# Patient Record
Sex: Female | Born: 1960 | Race: Black or African American | Hispanic: No | Smoking: Former smoker
Health system: Southern US, Community
[De-identification: ages and names within clinical notes are randomized; demographics above are authoritative.]

## PROBLEM LIST (undated history)

## (undated) DIAGNOSIS — F32A Depression, unspecified: Secondary | ICD-10-CM

## (undated) DIAGNOSIS — E785 Hyperlipidemia, unspecified: Secondary | ICD-10-CM

## (undated) DIAGNOSIS — I10 Essential (primary) hypertension: Secondary | ICD-10-CM

## (undated) DIAGNOSIS — M1990 Unspecified osteoarthritis, unspecified site: Secondary | ICD-10-CM

## (undated) DIAGNOSIS — R001 Bradycardia, unspecified: Secondary | ICD-10-CM

## (undated) DIAGNOSIS — F329 Major depressive disorder, single episode, unspecified: Secondary | ICD-10-CM

## (undated) DIAGNOSIS — F419 Anxiety disorder, unspecified: Secondary | ICD-10-CM

## (undated) HISTORY — DX: Depression, unspecified: F32.A

## (undated) HISTORY — DX: Hyperlipidemia, unspecified: E78.5

## (undated) HISTORY — PX: OVARIAN CYST SURGERY: SHX726

## (undated) HISTORY — DX: Bradycardia, unspecified: R00.1

## (undated) HISTORY — PX: OVARY SURGERY: SHX727

## (undated) HISTORY — DX: Major depressive disorder, single episode, unspecified: F32.9

---

## 2000-12-19 ENCOUNTER — Encounter (HOSPITAL_COMMUNITY): Admission: RE | Admit: 2000-12-19 | Discharge: 2001-01-18 | Payer: Self-pay | Admitting: Family Medicine

## 2002-08-26 ENCOUNTER — Emergency Department (HOSPITAL_COMMUNITY): Admission: EM | Admit: 2002-08-26 | Discharge: 2002-08-26 | Payer: Self-pay | Admitting: Internal Medicine

## 2002-08-26 ENCOUNTER — Encounter: Payer: Self-pay | Admitting: Internal Medicine

## 2005-03-27 ENCOUNTER — Emergency Department (HOSPITAL_COMMUNITY): Admission: EM | Admit: 2005-03-27 | Discharge: 2005-03-27 | Payer: Self-pay | Admitting: Emergency Medicine

## 2007-02-10 ENCOUNTER — Ambulatory Visit: Payer: Self-pay | Admitting: Family Medicine

## 2007-02-10 ENCOUNTER — Ambulatory Visit (HOSPITAL_COMMUNITY): Admission: RE | Admit: 2007-02-10 | Discharge: 2007-02-10 | Payer: Self-pay | Admitting: Family Medicine

## 2007-03-04 ENCOUNTER — Encounter: Payer: Self-pay | Admitting: Family Medicine

## 2007-03-04 LAB — CONVERTED CEMR LAB
BUN: 8 mg/dL (ref 6–23)
Basophils Absolute: 0.1 10*3/uL (ref 0.0–0.1)
Basophils Relative: 1 % (ref 0–1)
CO2: 22 meq/L (ref 19–32)
Calcium: 9.6 mg/dL (ref 8.4–10.5)
Chloride: 106 meq/L (ref 96–112)
Cholesterol: 197 mg/dL (ref 0–200)
Creatinine, Ser: 0.85 mg/dL (ref 0.40–1.20)
Eosinophils Absolute: 0.2 10*3/uL (ref 0.0–0.7)
Eosinophils Relative: 2 % (ref 0–5)
Glucose, Bld: 75 mg/dL (ref 70–99)
HCT: 38.9 % (ref 36.0–46.0)
HDL: 59 mg/dL (ref >39)
Hemoglobin: 12.8 g/dL (ref 12.0–15.0)
LDL Cholesterol: 119 mg/dL — ABNORMAL HIGH (ref 0–99)
Lymphocytes Relative: 32 % (ref 12–46)
Lymphs Abs: 3.2 10*3/uL (ref 0.7–4.0)
MCHC: 32.9 g/dL (ref 30.0–36.0)
MCV: 85.3 fL (ref 78.0–100.0)
Monocytes Absolute: 0.5 10*3/uL (ref 0.1–1.0)
Monocytes Relative: 5 % (ref 3–12)
Neutro Abs: 6.2 10*3/uL (ref 1.7–7.7)
Neutrophils Relative %: 62 % (ref 43–77)
Platelets: 271 10*3/uL (ref 150–400)
Potassium: 4.4 meq/L (ref 3.5–5.3)
RBC: 4.56 M/uL (ref 3.87–5.11)
RDW: 14.4 % (ref 11.5–15.5)
Sodium: 139 meq/L (ref 135–145)
TSH: 1.158 microintl units/mL (ref 0.350–5.50)
Total CHOL/HDL Ratio: 3.3
Triglycerides: 96 mg/dL (ref <150)
VLDL: 19 mg/dL (ref 0–40)
WBC: 10 10*3/uL (ref 4.0–10.5)

## 2007-03-23 ENCOUNTER — Ambulatory Visit: Payer: Self-pay | Admitting: Family Medicine

## 2007-03-31 ENCOUNTER — Ambulatory Visit (HOSPITAL_COMMUNITY): Admission: RE | Admit: 2007-03-31 | Discharge: 2007-03-31 | Payer: Self-pay | Admitting: Family Medicine

## 2007-05-12 DIAGNOSIS — I10 Essential (primary) hypertension: Secondary | ICD-10-CM | POA: Insufficient documentation

## 2007-05-22 ENCOUNTER — Encounter: Payer: Self-pay | Admitting: Family Medicine

## 2007-05-22 ENCOUNTER — Ambulatory Visit: Payer: Self-pay | Admitting: Family Medicine

## 2007-05-22 ENCOUNTER — Other Ambulatory Visit: Admission: RE | Admit: 2007-05-22 | Discharge: 2007-05-22 | Payer: Self-pay | Admitting: Family Medicine

## 2007-05-24 ENCOUNTER — Encounter: Payer: Self-pay | Admitting: Family Medicine

## 2007-05-24 LAB — CONVERTED CEMR LAB
Candida species: NEGATIVE
Chlamydia, DNA Probe: NEGATIVE
GC Probe Amp, Genital: NEGATIVE
Gardnerella vaginalis: POSITIVE — AB
Trichomonal Vaginitis: NEGATIVE

## 2007-10-25 ENCOUNTER — Ambulatory Visit: Payer: Self-pay | Admitting: Family Medicine

## 2007-10-26 DIAGNOSIS — N938 Other specified abnormal uterine and vaginal bleeding: Secondary | ICD-10-CM | POA: Insufficient documentation

## 2007-10-26 DIAGNOSIS — F17200 Nicotine dependence, unspecified, uncomplicated: Secondary | ICD-10-CM | POA: Insufficient documentation

## 2007-11-01 ENCOUNTER — Ambulatory Visit (HOSPITAL_COMMUNITY): Admission: RE | Admit: 2007-11-01 | Discharge: 2007-11-01 | Payer: Self-pay | Admitting: Family Medicine

## 2007-11-23 ENCOUNTER — Telehealth: Payer: Self-pay | Admitting: Family Medicine

## 2008-01-29 ENCOUNTER — Ambulatory Visit: Payer: Self-pay | Admitting: Family Medicine

## 2008-01-29 DIAGNOSIS — E785 Hyperlipidemia, unspecified: Secondary | ICD-10-CM | POA: Insufficient documentation

## 2008-02-05 ENCOUNTER — Telehealth: Payer: Self-pay | Admitting: Family Medicine

## 2008-02-05 ENCOUNTER — Emergency Department (HOSPITAL_COMMUNITY): Admission: EM | Admit: 2008-02-05 | Discharge: 2008-02-05 | Payer: Self-pay | Admitting: Emergency Medicine

## 2008-02-06 ENCOUNTER — Ambulatory Visit: Payer: Self-pay | Admitting: Family Medicine

## 2008-02-06 DIAGNOSIS — E876 Hypokalemia: Secondary | ICD-10-CM | POA: Insufficient documentation

## 2008-02-07 ENCOUNTER — Encounter: Payer: Self-pay | Admitting: Family Medicine

## 2008-02-07 DIAGNOSIS — IMO0001 Reserved for inherently not codable concepts without codable children: Secondary | ICD-10-CM | POA: Insufficient documentation

## 2008-02-07 LAB — CONVERTED CEMR LAB: Total CK: 336 units/L — ABNORMAL HIGH (ref 7–177)

## 2008-02-09 ENCOUNTER — Encounter: Payer: Self-pay | Admitting: Family Medicine

## 2008-02-20 ENCOUNTER — Ambulatory Visit: Payer: Self-pay | Admitting: Family Medicine

## 2008-02-20 ENCOUNTER — Telehealth: Payer: Self-pay | Admitting: Family Medicine

## 2008-03-11 ENCOUNTER — Telehealth: Payer: Self-pay | Admitting: Family Medicine

## 2008-08-12 ENCOUNTER — Ambulatory Visit: Payer: Self-pay | Admitting: Family Medicine

## 2008-08-12 ENCOUNTER — Encounter: Payer: Self-pay | Admitting: Family Medicine

## 2008-08-12 ENCOUNTER — Other Ambulatory Visit: Admission: RE | Admit: 2008-08-12 | Discharge: 2008-08-12 | Payer: Self-pay | Admitting: Family Medicine

## 2008-08-12 LAB — CONVERTED CEMR LAB: OCCULT 1: NEGATIVE

## 2008-11-20 ENCOUNTER — Ambulatory Visit: Payer: Self-pay | Admitting: Family Medicine

## 2008-11-20 ENCOUNTER — Telehealth: Payer: Self-pay | Admitting: Family Medicine

## 2008-11-20 DIAGNOSIS — R5381 Other malaise: Secondary | ICD-10-CM | POA: Insufficient documentation

## 2008-11-20 DIAGNOSIS — Z862 Personal history of diseases of the blood and blood-forming organs and certain disorders involving the immune mechanism: Secondary | ICD-10-CM | POA: Insufficient documentation

## 2008-11-20 DIAGNOSIS — R5383 Other fatigue: Secondary | ICD-10-CM

## 2008-11-21 ENCOUNTER — Telehealth: Payer: Self-pay | Admitting: Family Medicine

## 2008-11-24 DIAGNOSIS — N920 Excessive and frequent menstruation with regular cycle: Secondary | ICD-10-CM | POA: Insufficient documentation

## 2008-11-26 ENCOUNTER — Encounter: Payer: Self-pay | Admitting: Family Medicine

## 2008-12-06 ENCOUNTER — Telehealth: Payer: Self-pay | Admitting: Family Medicine

## 2008-12-09 LAB — CONVERTED CEMR LAB: Retic Ct Pct: 5 % — ABNORMAL HIGH (ref 0.4–3.1)

## 2008-12-23 ENCOUNTER — Ambulatory Visit: Payer: Self-pay | Admitting: Family Medicine

## 2009-03-26 ENCOUNTER — Emergency Department (HOSPITAL_COMMUNITY): Admission: EM | Admit: 2009-03-26 | Discharge: 2009-03-26 | Payer: Self-pay | Admitting: Emergency Medicine

## 2009-06-09 ENCOUNTER — Ambulatory Visit: Payer: Self-pay | Admitting: Family Medicine

## 2009-06-09 ENCOUNTER — Emergency Department (HOSPITAL_COMMUNITY): Admission: EM | Admit: 2009-06-09 | Discharge: 2009-06-09 | Payer: Self-pay | Admitting: Emergency Medicine

## 2009-06-09 DIAGNOSIS — R002 Palpitations: Secondary | ICD-10-CM | POA: Insufficient documentation

## 2009-06-11 ENCOUNTER — Encounter: Payer: Self-pay | Admitting: Family Medicine

## 2009-06-17 ENCOUNTER — Ambulatory Visit: Payer: Self-pay | Admitting: Family Medicine

## 2009-08-13 ENCOUNTER — Telehealth: Payer: Self-pay | Admitting: Family Medicine

## 2009-10-30 ENCOUNTER — Ambulatory Visit: Payer: Self-pay | Admitting: Family Medicine

## 2010-03-15 ENCOUNTER — Encounter: Payer: Self-pay | Admitting: Family Medicine

## 2010-03-16 ENCOUNTER — Encounter: Payer: Self-pay | Admitting: Family Medicine

## 2010-03-19 ENCOUNTER — Ambulatory Visit
Admission: RE | Admit: 2010-03-19 | Discharge: 2010-03-19 | Payer: Self-pay | Source: Home / Self Care | Attending: Family Medicine | Admitting: Family Medicine

## 2010-03-20 ENCOUNTER — Other Ambulatory Visit: Payer: Self-pay | Admitting: Family Medicine

## 2010-03-20 ENCOUNTER — Other Ambulatory Visit (HOSPITAL_COMMUNITY)
Admission: RE | Admit: 2010-03-20 | Discharge: 2010-03-20 | Disposition: A | Discharge status: Home / Self Care | Payer: No Typology Code available for payment source | Source: Ambulatory Visit | Attending: Family Medicine | Admitting: Family Medicine

## 2010-03-20 DIAGNOSIS — Z01419 Encounter for gynecological examination (general) (routine) without abnormal findings: Secondary | ICD-10-CM | POA: Insufficient documentation

## 2010-03-20 LAB — CONVERTED CEMR LAB: OCCULT 1: NEGATIVE

## 2010-03-23 ENCOUNTER — Telehealth: Payer: Self-pay | Admitting: Family Medicine

## 2010-03-23 ENCOUNTER — Encounter: Payer: Self-pay | Admitting: Family Medicine

## 2010-03-23 ENCOUNTER — Other Ambulatory Visit: Payer: Self-pay | Admitting: Family Medicine

## 2010-03-23 DIAGNOSIS — Z139 Encounter for screening, unspecified: Secondary | ICD-10-CM

## 2010-03-24 ENCOUNTER — Encounter: Payer: Self-pay | Admitting: Family Medicine

## 2010-03-24 NOTE — Progress Notes (Signed)
Summary: med  Phone Note Call from Patient   Summary of Call: pt needs a refill on blood pressure meds and potissum meds. [facility] [phone] Initial call taken by: Rudene Anda,  August 13, 2009 8:28 AM    Prescriptions: BISOPROLOL-HYDROCHLOROTHIAZIDE 5-6.25 MG TABS (BISOPROLOL-HYDROCHLOROTHIAZIDE) Take 1 tablet by mouth every morning  #30 x 2   Entered by:   Adella Hare LPN   Authorized by:   Syliva Overman MD   Signed by:   Adella Hare LPN on 11/91/4782   Method used:   Electronically to        [facility] (retail)       [address]       [place], [place]  [address]       Ph: [phone]       Fax: [phone]   RxID:   [id number] KLOR-CON M20 20 MEQ CR-TABS (POTASSIUM CHLORIDE CRYS CR) Take 1 tablet by mouth two times a day  #60 x 2   Entered by:   Adella Hare LPN   Authorized by:   Syliva Overman MD   Signed by:   Adella Hare LPN on 47/42/5956   Method used:   Electronically to        [facility] [address] (retail)       [address]       [place], [place]  [address]       Ph: [phone]       Fax: [phone]   RxID:   [id number]   Appended Document: med Prescriptions: KLOR-CON M20 20 MEQ CR-TABS (POTASSIUM CHLORIDE CRYS CR) Take 1 tablet by mouth two times a day  #60 x 2   Entered by:   Adella Hare LPN   Authorized by:   Syliva Overman MD   Signed by:   Adella Hare LPN on 27/07/2374   Method used:   Electronically to        [facility]  [address] (retail)       [address]       [place], [place]  [address]       Ph: [phone]       Fax: [phone]   RxID:   [id number] BISOPROLOL-HYDROCHLOROTHIAZIDE 5-6.25 MG TABS (BISOPROLOL-HYDROCHLOROTHIAZIDE) Take 1 tablet by mouth every morning  #30 x 2   Entered by:   Adella Hare LPN   Authorized by:   Syliva Overman MD   Signed by:   Adella Hare LPN on 71/69/6789   Method used:   Electronically to        [facility]   [address] (retail)       [address]       [place], [place]  [address]       Ph: [phone]       Fax: [phone]   RxID:   [id number]  [facility], [facility]

## 2010-03-24 NOTE — Progress Notes (Signed)
Summary: MEDS  Phone Note Call from Patient   Summary of Call: NEEDS BC CHANGED TO SOMETHING ELSE SHE WENT TO PICK UP AND WAS 130.00 AND THE BP WAS 4.00  PLEASE SEND IN SOMETHING ELSE THANK YOU AND CALL HER LET HER KNOW Initial call taken by: Lind Guest,  March 11, 2008 2:36 PM  Follow-up for Phone Call        advise the only oCP's that are for $4 are SPRINTEC 28 day, or TRI-SPRINTEC 28 day tabpls erx one or the other, 1 cycle refill 3 whichever she prefers, I think it is worth trying Follow-up by: Syliva Overman MD,  March 11, 2008 4:46 PM  Additional Follow-up for Phone Call Additional follow up Details #1::        Phone Call Completed, Rx Called In Additional Follow-up by: Worthy Keeler LPN,  March 11, 2008 4:49 PM    New/Updated Medications: SPRINTEC 28 0.25-35 MG-MCG TABS (NORGESTIMATE-ETH ESTRADIOL) one tab by mouth qd   Prescriptions: SPRINTEC 28 0.25-35 MG-MCG TABS (NORGESTIMATE-ETH ESTRADIOL) one tab by mouth qd  #1 month x 3   Entered by:   Worthy Keeler LPN   Authorized by:   Syliva Overman MD   Signed by:   Worthy Keeler LPN on 15/17/6160   Method used:   Electronically to        [facility]  [address] (retail)       [address]       [place], [place]  [address]       Ph: [phone]       Fax: [phone]   RxID:   [id number]

## 2010-03-24 NOTE — Progress Notes (Signed)
Summary: NEEDS MEDICINE  Phone Note Call from Patient   Summary of Call: WANT TO KNOW ABOUT CALLING IN HER MEDICINE ABOUT HER HEAVY BLEEDING [facility] IN [place]  CALL BACK AT [phone] Initial call taken by: Lind Guest,  December 06, 2008 8:16 AM  Follow-up for Phone Call        Marijean Niemann already sent a message regarding this matter to doctor for review Follow-up by: Everitt Amber,  December 06, 2008 10:52 AM

## 2010-03-24 NOTE — Assessment & Plan Note (Signed)
Summary: office visit   Vital Signs:  Patient profile:   50 year old female Menstrual status:  regular Height:      64 inches Weight:      124.38 pounds BMI:     21.43 O2 Sat:      97 % Pulse rate:   128 / minute Pulse rhythm:   regular Resp:     16 per minute BP sitting:   130 / 80  (left arm) Cuff size:   regular  Vitals Entered By: Everitt Amber (November 20, 2008 1:07 PM) CC: severe menstrual bleeding, started period lastweek, and yesterday started bleeding very heavily and big clots. feeling very weak and fatigued. Hgb 9.4 Is Patient Diabetic? No   CC:  severe menstrual bleeding, started period lastweek, and and yesterday started bleeding very heavily and big clots. feeling very weak and fatigued. Hgb 9.4.  History of Present Illness: DFlooding clotting since yesterday at 1 pm , using tampons and pads and having to change every , passing large amts of bloods. shwee hS FIBROIDS  HAD A REGULAR CYCLE LAST WEEK , STARTED A NEW PACK ON sUNDAY AND DEVELOPED CONSTANT BLEEDING FOR THE past 24 hrs. she feels light headed, but denies syncope. This is the first episode of this nature in over 18 months and the most severe. she reports  that she was doing well, priior to this. Denies recent fever or chills. Denies sinus pressure, nasal congestion , ear pain or sore throat. Denies chest congestion, or cough productive of sputum. Denies chest pain, palpitations, PND, orthopnea or leg swelling. Denies abdominal pain, nausea, vomitting, diarrhea or constipation. Denies change in bowel movements or bloody stool. Denies dysuria , frequency, incontinence or hesitancy. Denies  joint pain, swelling, or reduced mobility. Denies headaches, vertigo, seizures. Reports anxiety with this new symptom. Denies  rash, lesions, or itch.       Preventive Screening-Counseling & Management  Alcohol-Tobacco     Smoking Cessation Counseling: yes  Allergies: 1)  ! Maxzide-25  (Triamterene-Hctz)  Review of Systems      See HPI  Physical Exam  General:  alert, well-nourished, and well-hydrated.  HEENT: No facial asymmetry,  EOMI, No sinus tenderness, TM's Clear, oropharynx  pink and moist.   Chest: Clear to auscultation bilaterally.  CVS: S1, S2, No murmurs, No S3.   Abd: Soft, Nontender.  MS: Adequate ROM spine, hips, shoulders and knees.  Ext: No edema.   CNS: CN 2-12 intact, power tone and sensation normal throughout.   Skin: Intact, no visible lesions or rashes.  Psych: Good eye contact, normal affect.  Memory intact,  anxious  appearing.     Impression & Recommendations:  Problem # 1:  FATIGUE (ICD-780.79) Assessment Deteriorated  Orders: T-CBC No Diff ([id number])  Problem # 2:  MENORRHAGIA (ICD-626.2) Assessment: Deteriorated  Her updated medication list for this problem includes:    Sprintec 28 0.25-35 Mg-mcg Tabs (Norgestimate-eth estradiol) ..... One tab by mouth qd    Medroxyprogesterone Acetate 10 Mg Tabs (Medroxyprogesterone acetate) .Marland KitchenMarland KitchenMarland KitchenMarland Kitchen 3 tablets every day, start 11/20/2008 Management was discussed with gynae on call Dr Despina Hidden, and close f/u next week was arranged prior to pt leaving the office. She was also advised that should her bleeding worsen or cease to lessen to go diresctly to the ED  Problem # 3:  HYPERTENSION (ICD-401.9) Assessment: Improved  Her updated medication list for this problem includes:    Hydrochlorothiazide 25 Mg Tabs (Hydrochlorothiazide) ..... One half tab by mouth qd  BP today: 130/80 Prior BP: 144/70 (08/12/2008)  Labs Reviewed: K+: 4.4 (03/04/2007) Creat: : 0.85 (03/04/2007)   Chol: 197 (03/04/2007)   HDL: 59 (03/04/2007)   LDL: 119 (03/04/2007)   TG: 96 (03/04/2007)  Problem # 4:  NICOTINE ADDICTION (ICD-305.1) Assessment: Unchanged  Encouraged smoking cessation and discussed different methods for smoking cessation.   Complete Medication List: 1)  Hydrochlorothiazide 25 Mg Tabs  (Hydrochlorothiazide) .... One half tab by mouth qd 2)  Sprintec 28 0.25-35 Mg-mcg Tabs (Norgestimate-eth estradiol) .... One tab by mouth qd 3)  Medroxyprogesterone Acetate 10 Mg Tabs (Medroxyprogesterone acetate) .... 3 tablets every day, start 11/20/2008  Other Orders: T- * Misc. Laboratory test [phone])  Patient Instructions: 1)  Please schedule a follow-up appointment in 1 month. 2)  You need to be evaluated urgently by gynae and I am contacting the on call doc, He has agreed to see you next week and we will get an appt made. 3)  Tobacco is very bad for your health and your loved ones! You Should stop smoking!. 4)  Stop Smoking Tips: Choose a Quit date. Cut down before the Quit date. decide what you will do as a substitute when you feel the urge to smoke(gum,toothpick,exercise). 5)  Med is sent to your pharmacy, start today, continue the OCp, DO nOT SMOKE. 6)  CBCand aemia panel on 10 04 /2010. 7)  Start OTC slow iron 1 twice daily. Prescriptions: MEDROXYPROGESTERONE ACETATE 10 MG TABS (MEDROXYPROGESTERONE ACETATE) 3 tablets every day, start 11/20/2008  #42 x 0   Entered by:   Everitt Amber   Authorized by:   Syliva Overman MD   Signed by:   Everitt Amber on 11/20/2008   Method used:   Electronically to        [facility]  [address] (retail)       [address]       [place], [place]  [address]       Ph: [phone]       Fax: [phone]   RxID:   [id number] MEDROXYPROGESTERONE ACETATE 10 MG TABS (MEDROXYPROGESTERONE ACETATE) 3 tablets every day, start 11/20/2008  #42 x 0   Entered and Authorized by:   Syliva Overman MD   Signed by:   Syliva Overman MD on 11/20/2008   Method used:   Electronically to        [facility] (retail)       [address]       [place], [place]  [address]       Ph: [phone]       Fax: [phone]   RxID:   [id number]

## 2010-03-24 NOTE — Progress Notes (Signed)
Summary: BP PILLS NAD BC   Phone Note Call from Patient   Summary of Call: WANTS TO KNOW IF YOU ARE GOING TO CHANGE HER BIRTH CONYROL PILL. AND THE PHAR. HAS NOT HEARD ANYTHING ABOUT HER BP PILLS THEY HAVE ALREADY FAXED THEM [phone] Initial call taken by: Lind Guest,  November 23, 2007 10:12 AM  Follow-up for Phone Call        Pt has refills done for bp meds.  Pls refer to Dr Hart [place] for fibroids and bleeding  pt wants appt NEXT YEAR pls Follow-up by: Syliva Overman MD,  November 24, 2007 10:48 AM  Additional Follow-up for Phone Call Additional follow up Details #1::        pt referred to dr,piggott. Faxed over information and they will call pt with appt.  Additional Follow-up by: Rudene Anda,  November 24, 2007 4:01 PM

## 2010-03-24 NOTE — Progress Notes (Signed)
Summary: rx  Phone Note Call from Patient   Summary of Call: needs high blood pressure pills and birth control faxed to [facility]. [phone] Initial call taken by: Rudene Anda,  March 11, 2008 9:23 AM  Follow-up for Phone Call        Rx Called In Follow-up by: Worthy Keeler LPN,  March 11, 2008 2:02 PM  Additional Follow-up for Phone Call Additional follow up Details #1::        noted, thank you Additional Follow-up by: Syliva Overman MD,  March 11, 2008 4:07 PM      Prescriptions: HYDROCHLOROTHIAZIDE 25 MG TABS (HYDROCHLOROTHIAZIDE) Take 1 tablet by mouth once a day  #30 x 2   Entered by:   Worthy Keeler LPN   Authorized by:   Syliva Overman MD   Signed by:   Worthy Keeler LPN on 45/40/9811   Method used:   Electronically to        [facility]  [address] (retail)       [address]       [place], [place]  [address]       Ph: [phone]       Fax: [phone]   RxID:   [id number] JOLESSA 0.15-0.03 MG TABS (LEVONORGEST-ETH ESTRAD 91-DAY) uad  #1 month x 2   Entered by:   Worthy Keeler LPN   Authorized by:   Syliva Overman MD   Signed by:   Worthy Keeler LPN on 66/44/0347   Method used:   Electronically to        [facility]  [address] (retail)       [address]       [place], [place]  [address]       Ph: [phone]       Fax: [phone]   RxID:   [id number]

## 2010-03-24 NOTE — Progress Notes (Signed)
Summary: rx  Phone Note Call from Patient   Summary of Call: pt needs rx [facility] to [facility] not [facility] Initial call taken by: Rudene Anda,  November 20, 2008 3:42 PM  Follow-up for Phone Call        sent to [facility] Follow-up by: Everitt Amber,  November 20, 2008 4:09 PM

## 2010-03-24 NOTE — Progress Notes (Signed)
Summary: refill on bc pills  Phone Note Call from Patient   Summary of Call: pt needs a refill on birth control pills. called into [facility]. [phone] Initial call taken by: Rudene Anda,  November 21, 2008 1:32 PM  Follow-up for Phone Call        Rx Called In Follow-up by: Worthy Keeler LPN,  November 21, 2008 1:45 PM

## 2010-03-24 NOTE — Miscellaneous (Signed)
Summary: lab  Clinical Lists Changes  Orders: Added new Test order of T-TSH [phone]) - Signed

## 2010-03-24 NOTE — Assessment & Plan Note (Signed)
Summary: BP CHECK  Nurse Visit   Vital Signs:  Patient Profile:   50 Years Old Female CC:      Nurse Visit Height:     64 inches (162.56 cm) O2 Sat:      98 % Pulse rate:   77 / minute Resp:     16 per minute BP sitting:   148 / 82  (left arm)             Comments Patient came in for BP check. It was slightly higher than the 18th visit at 148/82. Patient voiced no other complaints     Prior Medications: JOLESSA 0.15-0.03 MG TABS (LEVONORGEST-ETH ESTRAD 91-DAY) uad HYDROCODONE-ACETAMINOPHEN 5-325 MG TABS (HYDROCODONE-ACETAMINOPHEN) Take 1-2 tabs every 4-6 hours as needed HYDROCHLOROTHIAZIDE 25 MG TABS (HYDROCHLOROTHIAZIDE) Take 1 tablet by mouth once a day Current Allergies: ! MAXZIDE-25 (TRIAMTERENE-HCTZ)      ]

## 2010-03-24 NOTE — Assessment & Plan Note (Signed)
Summary: FLU SHOT  Nurse Visit   Allergies: 1)  ! Maxzide-25 (Triamterene-Hctz)  Orders Added: 1)  Influenza Vaccine NON MCR [00028]   Influenza Vaccine    Vaccine Type: Fluvax Non-MCR    Site: left deltoid    Mfr: Novartis    Dose: 0.5 ml    Route: IM    Given by: Everitt Amber    Exp. Date: 05/2009    Lot #: [id number]

## 2010-03-24 NOTE — Assessment & Plan Note (Signed)
Summary: office visit   Vital Signs:  Patient Profile:   50 Years Old Female Height:     63.5 inches Weight:      132.5 pounds BMI:     23.19 Pulse rate:   63 / minute Resp:     16 per minute BP sitting:   130 / 78  (right arm)  Pt. in pain?   no  Vitals Entered By: Calvert Cantor (October 25, 2007 4:09 PM)                  Chief Complaint:  c/o irregular menstrual bleed, spotting, and and f/u chronic probs.  History of Present Illness: Spotting every 2 days for 3 weeks per month.Pt. is frustrated, wonders if changing her pill will help. Currently using generic [place].  [employer] down to 5 per day, no quit date yet. Pt. denies any recent fever or chills.She denies depreeion, anxiety or insomnia.    Prior Medications Reviewed Using: Medication Bottles  Updated Prior Medication List: HYDROCHLOROTHIAZIDE 12.5 MG  TABS (HYDROCHLOROTHIAZIDE) one tab by mouth once daily QUASENSE 0.15-0.03 MG TABS (LEVONORGEST-ETH ESTRAD 91-DAY) take one tab by mouth once daily  Current Allergies: No known allergies     Risk Factors:     Counseled to quit/cut down tobacco use:  yes   Review of Systems  ENT      Denies earache, hoarseness, sinus pressure, and sore throat.  CV      Denies chest pain or discomfort, palpitations, shortness of breath with exertion, and swelling of feet.  Resp      Denies cough, shortness of breath, sputum productive, and wheezing.  GI      Denies abdominal pain, constipation, diarrhea, nausea, and vomiting.  GU      Denies dysuria and urinary frequency.  MS      Denies joint pain, joint swelling, muscle weakness, and stiffness.  Allergy      Denies hives or rash, itching eyes, and seasonal allergies.   Physical Exam  General:     Well-developed,well-nourished,in no acute distress; alert,appropriate and cooperative throughout examination Head:     Normocephalic and atraumatic without obvious abnormalities. No apparent alopecia or  balding. Eyes:     vision grossly intact.   Ears:     External ear exam shows no significant lesions or deformities.  Otoscopic examination reveals clear canals, tympanic membranes are intact bilaterally without bulging, retraction, inflammation or discharge. Hearing is grossly normal bilaterally. Nose:     no external deformity, no external erythema, and no nasal discharge.   Mouth:     Oral mucosa and oropharynx without lesions or exudates.  Teeth in good repair. Neck:     No deformities, masses, or tenderness noted. Lungs:     Normal respiratory effort, chest expands symmetrically. Lungs are clear to auscultation, no crackles or wheezes. Heart:     Normal rate and regular rhythm. S1 and S2 normal without gallop, murmur, click, rub or other extra sounds. Abdomen:     soft and non-tender.   Extremities:     No clubbing, cyanosis, edema, or deformity noted with normal full range of motion of all joints.   Skin:     Intact without suspicious lesions or rashes Psych:     Cognition and judgment appear intact. Alert and cooperative with normal attention span and concentration. No apparent delusions, illusions, hallucinations    Impression & Recommendations:  Problem # 1:  DYSFUNCTIONAL UTERINE BLEEDING (ICD-626.8) Assessment: Comment Only  Future Orders: Radiology Referral (Radiology) ... 10/31/2007 No med adjustment until report available, if endometrium is thick, then gynae eval.   Problem # 2:  NICOTINE ADDICTION (ICD-305.1) Assessment: Improved Encouraged smoking cessation and discussed different methods for smoking cessation.   Problem # 3:  HYPERTENSION (ICD-401.9) Assessment: Improved  Her updated medication list for this problem includes:    Hydrochlorothiazide 12.5 Mg Tabs (Hydrochlorothiazide) ..... One tab by mouth once daily   Complete Medication List: 1)  Hydrochlorothiazide 12.5 Mg Tabs (Hydrochlorothiazide) .... One tab by mouth once daily 2)  Quasense  0.15-0.03 Mg Tabs (Levonorgest-eth estrad 91-day) .... Take one tab by mouth once daily   Patient Instructions: 1)  Tobacco is very bad for your health and your loved ones! You Should stop smoking!. 2)  Stop Smoking Tips: Choose a Quit date. Cut down before the Quit date. decide what you will do as a substitute when you feel the urge to smoke(gum,toothpick,exercise). 3)  You need a pelvic US , we will schedule, pls call back in 2 days for appt. 4)  I will discuss with the pharmacist birth control options and I will get back to you. 5)  Please schedule a follow-up appointment in 6 months.   ]

## 2010-03-24 NOTE — Assessment & Plan Note (Signed)
Summary: office visit   Vital Signs:  Patient profile:   50 year old female Menstrual status:  regular Height:      64 inches Weight:      139.25 pounds BMI:     23.99 O2 Sat:      99 % on Room air Pulse rate:   136 / minute Pulse rhythm:   regular Resp:     16 per minute BP sitting:   160 / 100  (left arm)  Vitals Entered By: Adella Hare LPN (June 09, 2009 9:43 AM)  O2 Flow:  Room air CC: palpitations, short of breath, dizziness, malaise and fatigue, left arm tingle Is Patient Diabetic? No Pain Assessment Patient in pain? no        CC:  palpitations, short of breath, dizziness, malaise and fatigue, and left arm tingle.  History of Present Illness: Pt states that last Thursday she became aware that her blood pressure was ,high, she then doubled up on her medication. She reports palpitations, lightheadedness and feeling as though she will pass out since yesterday. She does not drink alcohol regularly, and she had a single drink 4 days ago. She still smokes the same # of cigarettes daily. She denies increased stress and anxietyprio to thedevelopment of these symptoms  Preventive Screening-Counseling & Management  Alcohol-Tobacco     Smoking Cessation Counseling: yes  Current Medications (verified): 1)  Hydrochlorothiazide 25 Mg Tabs (Hydrochlorothiazide) .... One Half Tab By Mouth Qd 2)  Megestrol Acetate 20 Mg Tabs (Megestrol Acetate) .... Two Tabs By Mouth Once Daily 3)  Slow Fe 160 (50 Fe) Mg Cr-Tabs (Ferrous Sulfate Dried) .... One Tab By Mouth Once Daily  Allergies (verified): 1)  ! Maxzide-25 (Triamterene-Hctz)  Review of Systems      See HPI General:  Complains of fatigue and weakness. Eyes:  Denies blurring and discharge. ENT:  Denies hoarseness, nasal congestion, sinus pressure, and sore throat. CV:  Complains of lightheadness, palpitations, and shortness of breath with exertion; denies chest pain or discomfort and swelling of feet. Resp:  Denies cough  and sputum productive. GI:  Denies abdominal pain, constipation, diarrhea, nausea, and vomiting. GU:  Complains of abnormal vaginal bleeding; denies dysuria and urinary frequency; corrected with med from gynae, likely megace. MS:  Denies joint pain and stiffness. Derm:  Denies itching, lesion(s), and rash. Neuro:  Denies headaches and seizures. Psych:  Denies anxiety and depression. Endo:  Complains of weight change; denies excessive thirst and excessive urination. Heme:  Denies abnormal bruising and bleeding. Allergy:  Denies hives or rash and sneezing.  Physical Exam  General:  Well-developed,well-nourished,in no acute distress; alert,appropriate and cooperative throughout examination. pt anxious and looks uncomfortable stating that she feels very lightheaded HEENT: No facial asymmetry,  EOMI, No sinus tenderness, TM's Clear, oropharynx  pink and moist.   Chest: Clear to auscultation bilaterally.  CVS: S1, S2, No murmurs, No S3.   Abd: Soft, Nontender.  MS: Adequate ROM spine, hips, shoulders and knees.  Ext: No edema.   CNS: CN 2-12 intact, power tone and sensation normal throughout.   Skin: Intact, no visible lesions or rashes.  Psych: Good eye contact, normal affect.  Memory intact,  anxious  appearing.    Impression & Recommendations:  Problem # 1:  PALPITATIONS (ICD-785.1) Assessment Comment Only  Orders: EKG w/ Interpretation (93000) sinus rate of 129, based on symtomatology , pt taken to the ED  Problem # 2:  MENORRHAGIA (ICD-626.2) Assessment: Improved  The following medications  were removed from the medication list:    Sprintec 28 0.25-35 Mg-mcg Tabs (Norgestimate-eth estradiol) ..... One tab by mouth qd  Problem # 3:  NICOTINE ADDICTION (ICD-305.1) Assessment: Unchanged  Encouraged smoking cessation and discussed different methods for smoking cessation.   Problem # 4:  HYPERTENSION (ICD-401.9) Assessment: Deteriorated  Her updated medication list for this  problem includes:    Hydrochlorothiazide 25 Mg Tabs (Hydrochlorothiazide) ..... One half tab by mouth qd  BP today: 160/100 Prior BP: 130/80 (11/20/2008)  Labs Reviewed: K+: 4.4 (03/04/2007) Creat: : 0.85 (03/04/2007)   Chol: 197 (03/04/2007)   HDL: 59 (03/04/2007)   LDL: 119 (03/04/2007)   TG: 96 (03/04/2007)  Complete Medication List: 1)  Hydrochlorothiazide 25 Mg Tabs (Hydrochlorothiazide) .... One half tab by mouth qd 2)  Megestrol Acetate 20 Mg Tabs (Megestrol acetate) .... Two tabs by mouth once daily 3)  Slow Fe 160 (50 Fe) Mg Cr-tabs (Ferrous sulfate dried) .... One tab by mouth once daily  Patient Instructions: 1)  f/U in 1 to 2 week  2)  Your heart is beating too  fast, and you are being taken to the ed 3)  Tobacco is very bad for your health and your loved ones! You Should stop smoking!. 4)  Stop Smoking Tips: Choose a Quit date. Cut down before the Quit date. decide what you will do as a substitute when you feel the urge to smoke(gum,toothpick,exercise).

## 2010-03-24 NOTE — Assessment & Plan Note (Signed)
Summary: HOS SAID TO MAKE APPT.   Vital Signs:  Patient Profile:   50 Years Old Female Height:     64 inches (162.56 cm) Weight:      131.31 pounds (59.69 kg) BMI:     22.62 BSA:     1.64 O2 Sat:      98 % Pulse rate:   61 / minute Resp:     16 per minute BP sitting:   126 / 80  (left arm)  Vitals Entered By: Everitt Amber (February 06, 2008 2:25 PM)                 Chief Complaint:  Follow up from ER visit. Still having some cramping in right leg.  History of Present Illness: Yesterday pt developed severe cramps and muscle spasms over her entire body. she dei start maxzide 1 week ago  and this is the only new med or food.She has not had any maxzide sine 2 days ago.  she was seen in the Ed, no spec diag made and she is prescrbide  codeine pain pill.She has been out of work since she got sick     Updated Prior Medication List: JOLESSA 0.15-0.03 MG TABS (LEVONORGEST-ETH ESTRAD 91-DAY) uad HYDROCODONE-ACETAMINOPHEN 5-325 MG TABS (HYDROCODONE-ACETAMINOPHEN) Take 1-2 tabs every 4-6 hours as needed  Current Allergies: ! MAXZIDE-25 (TRIAMTERENE-HCTZ)     Review of Systems  CV      Denies chest pain or discomfort, lightheadness, near fainting, and palpitations.  Resp      Denies cough and sputum productive.  GI      Denies abdominal pain, constipation, diarrhea, nausea, and vomiting.  GU      Denies dysuria and nocturia.  MS      See HPI      Complains of muscle aches, muscle, and cramps.  Neuro      Denies falling down, headaches, seizures, and sensation of room spinning.  Psych      Denies anxiety and depression.   Physical Exam  General:     alert, well-nourished, and well-hydrated.   Head:     Normocephalic and atraumatic without obvious abnormalities. No apparent alopecia or balding. Eyes:     vision grossly intact.   Ears:     R ear normal and L ear normal.   Nose:     no external deformity and no nasal discharge.   Mouth:     fair dentition.    Neck:     No deformities, masses, or tenderness noted. Lungs:     Normal respiratory effort, chest expands symmetrically. Lungs are clear to auscultation, no crackles or wheezes. Heart:     Normal rate and regular rhythm. S1 and S2 normal without gallop, murmur, click, rub or other extra sounds. Abdomen:     soft and non-tender.   Extremities:     No clubbing, cyanosis, edema, or deformity noted with normal full range of motion of all joints.   Neurologic:     alert & oriented X3, cranial nerves II-XII intact, strength normal in all extremities, and sensation intact to light touch.   Skin:     Intact without suspicious lesions or rashes Cervical Nodes:     No lymphadenopathy noted Psych:     Cognition and judgment appear intact. Alert and cooperative with normal attention span and concentration. No apparent delusions, illusions, hallucinations    Impression & Recommendations:  Problem # 1:  HYPOKALEMIA (ICD-276.8) Assessment: Comment Only tp to start otc  potassim daily and rept chem 7 in 2 weeks  Problem # 2:  NICOTINE ADDICTION (ICD-305.1) Assessment: Unchanged Encouraged smoking cessation and discussed different methods for smoking cessation.   Problem # 3:  HYPERLIPIDEMIA (ICD-272.4) Assessment: Comment Only  Orders: T-Basic Metabolic Panel [id number])  Labs Reviewed: Chol: 197 (03/04/2007)   HDL: 59 (03/04/2007)   LDL: 119 (03/04/2007)   TG: 96 (03/04/2007) low fat diet discussed and encouraged  Problem # 4:  HYPERTENSION (ICD-401.9) Assessment: Comment Only  The following medications were removed from the medication list:    Maxzide-25 37.5-25 Mg Tabs (Triamterene-hctz) .Marland Kitchen... Take 1 tablet by mouth once a day  Her updated medication list for this problem includes:    Hydrochlorothiazide 25 Mg Tabs (Hydrochlorothiazide) .Marland Kitchen... Take 1 tablet by mouth once a day, pt to start with half daily sinc she is normotensive off meds for 2 days.Maxzide is d/cd because of  advers drug reaction  BP today: 126/80 Prior BP: 150/90 (01/29/2008)  Labs Reviewed: Creat: 0.85 (03/04/2007) Chol: 197 (03/04/2007)   HDL: 59 (03/04/2007)   LDL: 119 (03/04/2007)   TG: 96 (03/04/2007)   Problem # 5:  MYOSITIS (ICD-729.1) Assessment: Comment Only  Her updated medication list for this problem includes:    Hydrocodone-acetaminophen 5-325 Mg Tabs (Hydrocodone-acetaminophen) .Marland Kitchen... Take 1-2 tabs every 4-6 hours as needed  Orders: T- * Misc. Laboratory test [phone]) likely due to advers drug rxn to maxzide, will check cPK  Complete Medication List: 1)  Jolessa 0.15-0.03 Mg Tabs (Levonorgest-eth estrad 91-day) .... Uad 2)  Hydrocodone-acetaminophen 5-325 Mg Tabs (Hydrocodone-acetaminophen) .... Take 1-2 tabs every 4-6 hours as needed 3)  Hydrochlorothiazide 25 Mg Tabs (Hydrochlorothiazide) .... Take 1 tablet by mouth once a day   Patient Instructions: 1)  Nurse visit in 2 weeks for BP check.Marland Kitchen 2)  CPE iN March 30 or after. 3)  OLS check your BP daily and start HCTZ 25mg  HALF DAILY once your BP is 140/90 or more...(the bottle says onedaily) 4)  I anticipate you will need to resume the med by the weekend. PLS go to Urgent care or ED if you have any concerns. 5)  PLS start otc potassium 99mg  tab 1 daily. 6)  BMP prior to visit, ICD-9:  in 2 weeks   Prescriptions: HYDROCHLOROTHIAZIDE 25 MG TABS (HYDROCHLOROTHIAZIDE) Take 1 tablet by mouth once a day  #30 x 2   Entered and Authorized by:   Syliva Overman MD   Signed by:   Syliva Overman MD on 02/06/2008   Method used:   Electronically to        [facility] (retail)       [address]       [place], [place]  [address]       Ph: [phone]       Fax: [phone]   RxID:   [id number]  ]

## 2010-03-24 NOTE — Assessment & Plan Note (Signed)
Summary: physical   Vital Signs:  Patient profile:   50 year old female Menstrual status:  regular Height:      64 inches Weight:      126 pounds BMI:     21.71 Pulse rate:   87 / minute Pulse rhythm:   regular Resp:     16 per minute BP sitting:   144 / 70  (left arm)  Vitals Entered By: Worthy Keeler LPN (August 12, 2008 2:41 PM) CC: physical Is Patient Diabetic? No Pain Assessment Patient in pain? no       Vision Screening:Left eye w/o correction: 20 / 30 Right Eye w/o correction: 20 / 25 Both eyes w/o correction:  20/ 20        Vision Entered By: Worthy Keeler LPN (August 12, 2008 4:17 PM)  years   days  Menstrual Status regular   CC:  physical.  History of Present Illness: Smoking approx 5 ciggs per day, no quit date set aT THIS TIME. tHE PT HAS BEEN DOING FAIRLY WELL. She denies any recent fever or chills, head or chest congestion, dysuria or frequency. Shedenies significant jopint pain or tenderness. She continues to use contraceptive pills to ssist with control of her cycle, primarily.  Preventive Screening-Counseling & Management  Alcohol-Tobacco     Smoking Cessation Counseling: yes  Current Medications (verified): 1)  Hydrochlorothiazide 25 Mg Tabs (Hydrochlorothiazide) .... One Half Tab By Mouth Qd 2)  Sprintec 28 0.25-35 Mg-Mcg Tabs (Norgestimate-Eth Estradiol) .... One Tab By Mouth Qd  Allergies (verified): 1)  ! Maxzide-25 (Triamterene-Hctz)  Review of Systems      See HPI General:  Denies chills and fever. ENT:  Denies earache, hoarseness, nasal congestion, sinus pressure, and sore throat. CV:  Denies chest pain or discomfort, difficulty breathing while lying down, palpitations, and swelling of feet. Resp:  Denies coughing up blood and sputum productive. GI:  Denies abdominal pain, change in bowel habits, constipation, diarrhea, nausea, and vomiting. GU:  Complains of abnormal vaginal bleeding; denies dysuria and urinary frequency; pt has had  excessive vag bleeding all of her life and continues to depend on contraceptives to control this.. MS:  Denies joint pain and joint swelling. Derm:  Complains of lesion(s); sole of right foot, p[lantar wart . Psych:  Denies anxiety and depression. Heme:  Denies bleeding, enlarge lymph nodes, and fevers.  Physical Exam  General:  alert, well-nourished, and well-hydrated.   Head:  normocephalic.   Eyes:  No corneal or conjunctival inflammation noted. EOMI. Perrla. Funduscopic exam benign, without hemorrhages, exudates or papilledema. Vision grossly normal. Ears:  External ear exam shows no significant lesions or deformities.  Otoscopic examination reveals clear canals, tympanic membranes are intact bilaterally without bulging, retraction, inflammation or discharge. Hearing is grossly normal bilaterally. Nose:  External nasal examination shows no deformity or inflammation. Nasal mucosa are pink and moist without lesions or exudates. Mouth:  pharynx pink and moist and fair dentition.   Neck:  No deformities, masses, or tenderness noted. Chest Wall:  No deformities, masses, or tenderness noted. Breasts:  No mass, nodules, thickening, tenderness, bulging, retraction, inflamation, nipple discharge or skin changes noted.   Lungs:  Normal respiratory effort, chest expands symmetrically. Lungs are clear to auscultation, no crackles or wheezes. Heart:  Normal rate and regular rhythm. S1 and S2 normal without gallop, murmur, click, rub or other extra sounds. Abdomen:  Bowel sounds positive,abdomen soft and non-tender without masses, organomegaly or hernias noted. Rectal:  No external abnormalities noted.  Normal sphincter tone. No rectal masses or tenderness.Guaic neg stool Genitalia:  Normal introitus for age, no external lesions, no vaginal discharge, mucosa pink and moist, no vaginal or cervical lesions, no vaginal atrophy, no friaility or hemorrhage,enlargedl uterus size and position, no adnexal masses  or tenderness Msk:  No deformity or scoliosis noted of thoracic or lumbar spine.   Pulses:  R and L carotid,radial,femoral,dorsalis pedis and posterior tibial pulses are full and equal bilaterally Extremities:  No clubbing, cyanosis, edema, or deformity noted with normal full range of motion of all joints.   Neurologic:  No cranial nerve deficits noted. Station and gait are normal. Plantar reflexes are down-going bilaterally. DTRs are symmetrical throughout. Sensory, motor and coordinative functions appear intact. Skin:  Intact without suspicious lesions or rashes Cervical Nodes:  No lymphadenopathy noted Axillary Nodes:  No palpable lymphadenopathy Inguinal Nodes:  No significant adenopathy Psych:  Cognition and judgment appear intact. Alert and cooperative with normal attention span and concentration. No apparent delusions, illusions, hallucinations   Impression & Recommendations:  Problem # 1:  SPECIAL SCREENING FOR MALIGNANT NEOPLASMS COLON (ICD-V76.51) Assessment Comment Only  guaic neg stool, and gI referral based on pt's agees: Gastroenterology Referral (GI)  Problem # 2:  HYPERLIPIDEMIA (ICD-272.4) Assessment: Comment Only  Orders: T-Lipid Profile ([id number])    HDL:59 (03/04/2007)  LDL:119 (03/04/2007)  Chol:197 (03/04/2007)  Trig:96 (03/04/2007)  Problem # 3:  HYPERTENSION (ICD-401.9) Assessment: Unchanged  Her updated medication list for this problem includes:    Hydrochlorothiazide 25 Mg Tabs (Hydrochlorothiazide) ..... One half tab by mouth qd  Orders: T-Basic Metabolic Panel ([id number])  BP today: 144/70 Prior BP: 148/82 (02/20/2008)  Labs Reviewed: K+: 4.4 (03/04/2007) Creat: : 0.85 (03/04/2007)   Chol: 197 (03/04/2007)   HDL: 59 (03/04/2007)   LDL: 119 (03/04/2007)   TG: 96 (03/04/2007)  Problem # 4:  NICOTINE ADDICTION (ICD-305.1) Assessment: Improved  Encouraged smoking cessation and discussed different methods for smoking cessation.   Problem #  5:  DYSFUNCTIONAL UTERINE BLEEDING (ICD-626.8)  Complete Medication List: 1)  Hydrochlorothiazide 25 Mg Tabs (Hydrochlorothiazide) .... One half tab by mouth qd 2)  Sprintec 28 0.25-35 Mg-mcg Tabs (Norgestimate-eth estradiol) .... One tab by mouth qd  Other Orders: T-CBC w/Diff ([id number]) Hemoccult Guaiac-1 spec.(in office) (82270) Pap Smear (66063) Future Orders: Radiology Referral (Radiology) ... 08/13/2008  Patient Instructions: 1)  Please schedule a follow-up appointment in 4 months. 2)  Tobacco is very bad for your health and your loved ones! You Should stop smoking!. 3)  Stop Smoking Tips: Choose a Quit date. Cut down before the Quit date. decide what you will do as a substitute when you feel the urge to smoke(gum,toothpick,exercise). 4)  It is important that you exercise regularly at least 20 minutes 5 times a week. If you develop chest pain, have severe difficulty breathing, or feel very tired , stop exercising immediately and seek medical attention. 5)  BMP prior to visit, ICD-9: 6)  Lipid Panel prior to visit, ICD-9:  fastin asap 7)  Your BP is high today, pls watch salt and ealcohol use 8)  CBC w/ Diff prior to visit, ICD-9: Prescriptions: SPRINTEC 28 0.25-35 MG-MCG TABS (NORGESTIMATE-ETH ESTRADIOL) one tab by mouth qd  #28 Each x 11   Entered by:   Worthy Keeler LPN   Authorized by:   Syliva Overman MD   Signed by:   Worthy Keeler LPN on 01/60/1093   Method used:   Electronically to        [facility] (retail)       [address]       [place], [place]  [address]       Ph: [phone]       Fax: [phone]   RxID:   [id number] HYDROCHLOROTHIAZIDE 25 MG TABS (HYDROCHLOROTHIAZIDE) one half tab by mouth qd  #30 x 3   Entered by:   Worthy Keeler LPN   Authorized by:   Syliva Overman MD   Signed by:   Worthy Keeler LPN on 40/11/2723   Method used:   Electronically to        [facility] (retail)       [address]       [place], [place]  [address]       Ph: [phone]       Fax: [phone]   RxID:   [id number]    Laboratory Results  Date/Time Received: 08/12/08 4:20pm Date/Time Reported: 08/12/08 4:20pm  Stool - Occult Blood Hemmoccult #1: negative Date: 08/12/2008 Comments: [id number] 11L 8/11 118 10/12

## 2010-03-24 NOTE — Progress Notes (Signed)
Summary: cramps  Phone Note Call from Patient   Summary of Call: having cramps in back and legs. wondering what she should do [phone] Initial call taken by: Rudene Anda,  February 05, 2008 8:26 AM  Follow-up for Phone Call        patients sister called states patient has cramps all over and can see the muscles tensed up. advised to go to er  Follow-up by: Worthy Keeler LPN,  February 05, 2008 9:21 AM

## 2010-03-24 NOTE — Assessment & Plan Note (Signed)
Summary: FUP   Vital Signs:  Patient profile:   50 year old female Menstrual status:  regular Height:      64 inches Weight:      142.50 pounds BMI:     24.55 O2 Sat:      98 % Pulse rate:   62 / minute Pulse rhythm:   regular Resp:     16 per minute BP sitting:   130 / 80  (left arm) Cuff size:   regular  Vitals Entered By: Everitt Amber LPN (June 17, 2009 9:42 AM) CC: ER follow up from the 17th. Thought she was having heart attack. Heart was pounding and beating fast. No pain. BP was high   CC:  ER follow up from the 17th. Thought she was having heart attack. Heart was pounding and beating fast. No pain. BP was high.  History of Present Illness: Prt in for eR follow up. She was seen last week with new onset palpitations and uncontrolled hTN. Shje is on new meds which she is tolerating well, and she is no lonfer experiencing palpitations. she denies fever or chills. She still smokes stating she uses the nicotine to "calm her nerves" She denies head or chst congestion, dysuria or frequency.She denies depression.  Preventive Screening-Counseling & Management  Alcohol-Tobacco     Smoking Cessation Counseling: yes  Current Medications (verified): 1)  Megestrol Acetate 20 Mg Tabs (Megestrol Acetate) .... Two Tabs By Mouth Once Daily 2)  Slow Fe 160 (50 Fe) Mg Cr-Tabs (Ferrous Sulfate Dried) .... One Tab By Mouth Once Daily 3)  Klor-Con M20 20 Meq Cr-Tabs (Potassium Chloride Crys Cr) .... Take 1 Tablet By Mouth Two Times A Day 4)  Bisoprolol-Hydrochlorothiazide 5-6.25 Mg Tabs (Bisoprolol-Hydrochlorothiazide) .... Take 1 Tablet By Mouth Every Morning  Allergies (verified): 1)  ! Maxzide-25 (Triamterene-Hctz)  Review of Systems General:  Complains of fatigue. CV:  Denies chest pain or discomfort, lightheadness, near fainting, palpitations, shortness of breath with exertion, and swelling of feet. Resp:  Denies cough and sputum productive. GI:  Denies abdominal pain, constipation,  diarrhea, nausea, and vomiting. MS:  Denies joint pain, low back pain, mid back pain, and stiffness. Endo:  Denies cold intolerance, excessive hunger, excessive thirst, excessive urination, heat intolerance, polyuria, and weight change. Heme:  Denies abnormal bruising and bleeding. Allergy:  Denies hives or rash and itching eyes.  Physical Exam  General:  Well-developed,well-nourished,in no acute distress; alert,appropriate and cooperative throughout examination HEENT: No facial asymmetry,  EOMI, No sinus tenderness, TM's Clear, oropharynx  pink and moist.   Chest: Clear to auscultation bilaterally.  CVS: S1, S2, No murmurs, No S3.   Abd: Soft, Nontender.  MS: Adequate ROM spine, hips, shoulders and knees.  Ext: No edema.   CNS: CN 2-12 intact, power tone and sensation normal throughout.   Skin: Intact, no visible lesions or rashes.  Psych: Good eye contact, normal affect.  Memory intact, not anxious or depressed appearing.    Impression & Recommendations:  Problem # 1:  PALPITATIONS (ICD-785.1) Assessment Improved  Her updated medication list for this problem includes:    Bisoprolol-hydrochlorothiazide 5-6.25 Mg Tabs (Bisoprolol-hydrochlorothiazide) .Marland Kitchen... Take 1 tablet by mouth every morning  Problem # 2:  NICOTINE ADDICTION (ICD-305.1) Assessment: Unchanged  Encouraged smoking cessation and discussed different methods for smoking cessation.   Problem # 3:  HYPERLIPIDEMIA (ICD-272.4) Assessment: Comment Only    HDL:59 (03/04/2007)  LDL:119 (03/04/2007)  Chol:197 (03/04/2007)  Trig:96 (03/04/2007)low fat diet discussed and encouraged  Problem #  4:  HYPERTENSION (ICD-401.9) Assessment: Improved  The following medications were removed from the medication list:    Hydrochlorothiazide 25 Mg Tabs (Hydrochlorothiazide) ..... One half tab by mouth qd Her updated medication list for this problem includes:    Bisoprolol-hydrochlorothiazide 5-6.25 Mg Tabs  (Bisoprolol-hydrochlorothiazide) .Marland Kitchen... Take 1 tablet by mouth every morning  Orders: T-Basic Metabolic Panel [id number])  BP today: 130/80 Prior BP: 160/100 (06/09/2009)  Labs Reviewed: K+: 4.4 (03/04/2007) Creat: : 0.85 (03/04/2007)   Chol: 197 (03/04/2007)   HDL: 59 (03/04/2007)   LDL: 119 (03/04/2007)   TG: 96 (03/04/2007)  Complete Medication List: 1)  Megestrol Acetate 20 Mg Tabs (Megestrol acetate) .... Two tabs by mouth once daily 2)  Slow Fe 160 (50 Fe) Mg Cr-tabs (Ferrous sulfate dried) .... One tab by mouth once daily 3)  Klor-con M20 20 Meq Cr-tabs (Potassium chloride crys cr) .... Take 1 tablet by mouth two times a day 4)  Bisoprolol-hydrochlorothiazide 5-6.25 Mg Tabs (Bisoprolol-hydrochlorothiazide) .... Take 1 tablet by mouth every morning  Other Orders: Radiology Referral (Radiology)  Patient Instructions: 1)  Please schedule a follow-up appointment in 4.5 months. 2)  Tobacco is very bad for your health and your loved ones! You Should stop smoking!. 3)  Stop Smoking Tips: Choose a Quit date. Cut down before the Quit date. decide what you will do as a substitute when you feel the urge to smoke(gum,toothpick,exercise). 4)  Your blood prerssure is great 5)  BMP prior to visit, ICD-9:  dx htn and low potassium 6)  we will sched a mamo ,past due. 7)    8)  Schedule a colonoscopy/sigmoidoscopy to help detect colon cancer. 9)  You need to have a Pap Smear to prevent cervical cancer.

## 2010-03-24 NOTE — Progress Notes (Signed)
Summary: bleeding  Phone Note Call from Patient   Summary of Call: pt been passing clots since yesterday. she has to change every ten mins. can't come in but have in idea of what she could do. [phone] Initial call taken by: Rudene Anda,  November 20, 2008 9:21 AM  Follow-up for Phone Call        pt was seen in office today Follow-up by: Syliva Overman MD,  November 20, 2008 4:51 PM

## 2010-03-24 NOTE — Progress Notes (Signed)
Summary: BIRTH CONTROL  Phone Note Call from Patient   Summary of Call: NEEDS HER REGULAR BIRTH CONTROL PILL  SENT TO [facility] IN [place] Initial call taken by: Lind Guest,  November 21, 2008 11:01 AM    Prescriptions: SPRINTEC 28 0.25-35 MG-MCG TABS (NORGESTIMATE-ETH ESTRADIOL) one tab by mouth qd  #28 Each x 2   Entered by:   Worthy Keeler LPN   Authorized by:   Syliva Overman MD   Signed by:   Worthy Keeler LPN on 09/81/1914   Method used:   Electronically to        [facility]  [address] (retail)       [address]       [place], [place]  [address]       Ph: [phone]       Fax: [phone]   RxID:   [id number]

## 2010-03-24 NOTE — Assessment & Plan Note (Signed)
Summary: NURSE VISIT  Nurse Visit   Vital Signs:  Patient Profile:   50 Years Old Female CC:      NURSE VISIT Height:     64 inches (162.56 cm) O2 Sat:      86 % Resp:     16 per minute BP sitting:   140 / 84  (left arm)             Comments Maressa CAME IN FOR A BP CHECK AND IT WAS 140/84     Prior Medications: JOLESSA 0.15-0.03 MG TABS (LEVONORGEST-ETH ESTRAD 91-DAY) uad HYDROCODONE-ACETAMINOPHEN 5-325 MG TABS (HYDROCODONE-ACETAMINOPHEN) Take 1-2 tabs every 4-6 hours as needed HYDROCHLOROTHIAZIDE 25 MG TABS (HYDROCHLOROTHIAZIDE) Take 1 tablet by mouth once a day Current Allergies: ! MAXZIDE-25 (TRIAMTERENE-HCTZ)      ]

## 2010-03-24 NOTE — Progress Notes (Signed)
  Phone Note Call from Patient   Summary of Call: Catherine Mclean came in for BP check and it was 148/82 in right arm and 140/82 in left arm. Told her I would call her if you recommended any changes in her medication. Initial call taken by: Everitt Amber,  February 20, 2008 3:41 PM  Follow-up for Phone Call        no med changes now advise her to sched md visit in 3 months, cut down on nicotine this will help her bp Follow-up by: Syliva Overman MD,  February 20, 2008 3:50 PM  Additional Follow-up for Phone Call Additional follow up Details #1::        patient aware Additional Follow-up by: Everitt Amber,  February 20, 2008 4:25 PM

## 2010-03-24 NOTE — Progress Notes (Signed)
Summary: WANTS TELL U DIA. FROM ER  Phone Note Call from Patient   Summary of Call: [facility] CALLED AND IS DOING BETTER AND WENT TO ER AND WANTED TO GIVE U THE DIAG CALL BACK [phone] Initial call taken by: Lind Guest,  February 05, 2008 3:26 PM  Follow-up for Phone Call        attempted twice to call back but unable, pls download the d/c summary Follow-up by: Syliva Overman MD,  February 05, 2008 4:27 PM  Additional Follow-up for Phone Call Additional follow up Details #1::        Downloaded physican report from echart and gave to Dr. Lodema Hong Additional Follow-up by: Everitt Amber,  February 06, 2008 1:19 PM

## 2010-03-24 NOTE — Assessment & Plan Note (Signed)
Summary: flu shot  Nurse Visit   Vitals Entered By: Everitt Amber LPN (October 30, 2009 4:46 PM) Comments Patient in to recieve flu vaccine     Allergies: 1)  ! Maxzide-25 (Triamterene-Hctz)  Orders Added: 1)  Influenza Vaccine NON MCR [[id number]]   Influenza Vaccine    Vaccine Type: Fluvax Non-MCR    Site: left deltoid    Mfr: novartis    Dose: 0.5 ml    Route: IM    Given by: Everitt Amber LPN    Exp. Date: 06/2010    Lot #: [id number]  Flu Vaccine Consent Questions    Do you have a history of severe allergic reactions to this vaccine? no    Any prior history of allergic reactions to egg and/or gelatin? no    Do you have a sensitivity to the preservative Thimersol? no    Do you have a past history of Guillan-Barre Syndrome? no    Do you currently have an acute febrile illness? no    Have you ever had a severe reaction to latex? no    Vaccine information given and explained to patient? no    Are you currently pregnant? no

## 2010-03-24 NOTE — Assessment & Plan Note (Signed)
Summary: FOLLOW UP   Vital Signs:  Patient Profile:   50 Years Old Female Height:     64 inches Weight:      129.44 pounds BMI:     22.30 Pulse rate:   16 / minute Resp:     16 per minute BP sitting:   150 / 90  (left arm)  Pt. in pain?   no  Vitals Entered By: Worthy Keeler LPN (January 29, 2008 4:14 PM)                  Chief Complaint:  f/u htn and excess menses.  History of Present Illness: Smoking 5 ciggs /day.  No meore bleding on OCP. Pt. denies any recent fever or chills, depression or anxiety.      Updated Prior Medication List: HYDROCHLOROTHIAZIDE 12.5 MG  TABS (HYDROCHLOROTHIAZIDE) one tab by mouth once daily JOLESSA 0.15-0.03 MG TABS (LEVONORGEST-ETH ESTRAD 91-DAY) uad  Current Allergies: No known allergies     Risk Factors:     Counseled to quit/cut down tobacco use:  yes   Review of Systems  General      Denies chills, fatigue, fever, loss of appetite, malaise, sleep disorder, sweats, weakness, and weight loss.  ENT      Denies earache, hoarseness, nasal congestion, sinus pressure, and sore throat.  CV      Denies chest pain or discomfort, palpitations, and shortness of breath with exertion.  Resp      Denies cough, sputum productive, and wheezing.  GI      Denies abdominal pain, constipation, diarrhea, nausea, and vomiting.  GU      Denies dysuria and urinary frequency.  MS      Denies joint pain and stiffness.  Psych      Denies anxiety and depression.   Physical Exam  General:     Well-developed,well-nourished,in no acute distress; alert,appropriate and cooperative throughout examination Head:     Normocephalic and atraumatic without obvious abnormalities. No apparent alopecia or balding. Ears:     R ear normal and L ear normal.   Nose:     no external deformity and no nasal discharge.   Mouth:     fair dentition.   Neck:     No deformities, masses, or tenderness noted. Lungs:     Normal respiratory effort,  chest expands symmetrically. Lungs are clear to auscultation, no crackles or wheezes. Heart:     Normal rate and regular rhythm. S1 and S2 normal without gallop, murmur, click, rub or other extra sounds. Abdomen:     soft and non-tender.   Extremities:     No clubbing, cyanosis, edema, or deformity noted with normal full range of motion of all joints.   Skin:     Intact without suspicious lesions or rashes Psych:     Cognition and judgment appear intact. Alert and cooperative with normal attention span and concentration. No apparent delusions, illusions, hallucinations    Impression & Recommendations:  Problem # 1:  HYPERLIPIDEMIA (ICD-272.4) Assessment: Comment Only  Orders: T-Lipid Profile ([id number])  Labs Reviewed: Chol: 197 (03/04/2007)   HDL: 59 (03/04/2007)   LDL: 119 (03/04/2007)   TG: 96 (03/04/2007)   Problem # 2:  NICOTINE ADDICTION (ICD-305.1) Assessment: Improved Encouraged smoking cessation and discussed different methods for smoking cessation.   Problem # 3:  DYSFUNCTIONAL UTERINE BLEEDING (ICD-626.8) Assessment: Improved  Orders: T-CBC w/Diff ([id number])   Problem # 4:  HYPERTENSION (ICD-401.9) Assessment: Deteriorated  The following medications  were removed from the medication list:    Hydrochlorothiazide 12.5 Mg Tabs (Hydrochlorothiazide) ..... One tab by mouth once daily  Her updated medication list for this problem includes:    Maxzide-25 37.5-25 Mg Tabs (Triamterene-hctz) .Marland Kitchen... Take 1 tablet by mouth once a day  Orders: T-Basic Metabolic Panel [id number])  BP today: 150/90 Prior BP: 130/78 (10/25/2007)  Labs Reviewed: Creat: 0.85 (03/04/2007) Chol: 197 (03/04/2007)   HDL: 59 (03/04/2007)   LDL: 119 (03/04/2007)   TG: 96 (03/04/2007)   Complete Medication List: 1)  Jolessa 0.15-0.03 Mg Tabs (Levonorgest-eth estrad 91-day) .... Uad 2)  Maxzide-25 37.5-25 Mg Tabs (Triamterene-hctz) .... Take 1 tablet by mouth once a day   Patient  Instructions: 1)  Cpe last week of March or early april. 2)  Tobacco is very bad for your health and your loved ones! You Should stop smoking!. 3)  Stop Smoking Tips: Choose a Quit date. Cut down before the Quit date. decide what you will do as a substitute when you feel the urge to smoke(gum,toothpick,exercise). 4)  It is important that you exercise regularly at least 20 minutes 5 times a week. If you develop chest pain, have severe difficulty breathing, or feel very tired , stop exercising immediately and seek medical attention. 5)  Check your Blood Pressure regularly. If it is above150/95  you should make an appointment. 6)  YOUR bp is still high, you need to STOP the current BP med and START a NEW MED maxzide 25mg  1 daily , it is at your pharmacy. 7)  BMP prior to visit, ICD-9: 8)  Lipid Panel prior to visit, ICD-9: 9)  CBC w/ Diff prior to visit, ICD-9:   fasting labs end March    Prescriptions: MAXZIDE-25 37.5-25 MG TABS (TRIAMTERENE-HCTZ) Take 1 tablet by mouth once a day  #30 x 4   Entered and Authorized by:   Syliva Overman MD   Signed by:   Syliva Overman MD on 01/29/2008   Method used:   Electronically to        [facility] (retail)       [address]       [place], [place]  [address]       Ph: [phone]       Fax: [phone]   RxID:   [id number]  ]

## 2010-03-30 ENCOUNTER — Ambulatory Visit (HOSPITAL_COMMUNITY): Payer: No Typology Code available for payment source

## 2010-04-01 NOTE — Letter (Signed)
Summary: DR.EURE  DR.EURE   Imported By: Lind Guest 03/23/2010 10:50:16  _____________________________________________________________________  External Attachment:    Type:   Image     Comment:   External Document

## 2010-04-01 NOTE — Letter (Signed)
Summary: dr.eure  dr.eure   Imported By: Lind Guest 03/24/2010 13:09:59  _____________________________________________________________________  External Attachment:    Type:   Image     Comment:   External Document

## 2010-04-01 NOTE — Progress Notes (Signed)
  Phone Note Other Incoming   Caller: dr simpson Summary of Call: pls let pt know I got a response from Dr Despina Hidden, he states she can be continued on the med, pls refill 1 year total and let her know, the megace Initial call taken by: Syliva Overman MD,  March 23, 2010 6:19 PM  Follow-up for Phone Call        med sent, called patient, left message Follow-up by: Adella Hare LPN,  March 24, 2010 12:13 PM  Additional Follow-up for Phone Call Additional follow up Details #1::        patient aware Additional Follow-up by: Adella Hare LPN,  March 24, 2010 2:15 PM    Prescriptions: MEGESTROL ACETATE 20 MG TABS (MEGESTROL ACETATE) two tabs by mouth once daily  #60 x 10   Entered by:   Adella Hare LPN   Authorized by:   Syliva Overman MD   Signed by:   Adella Hare LPN on 56/43/3295   Method used:   Electronically to        [facility]  [address] (retail)       [address]       [place], [place]  [address]       Ph: [phone]       Fax: [phone]   RxID:   [id number]

## 2010-04-01 NOTE — Assessment & Plan Note (Signed)
Summary: phy   Vital Signs:  Patient profile:   50 year old female Menstrual status:  regular Height:      64 inches Weight:      139.75 pounds BMI:     24.07 O2 Sat:      97 % on Room air Pulse rate:   58 / minute Pulse rhythm:   regular Resp:     16 per minute BP sitting:   110 / 80  (left arm)  Vitals Entered By: Adella Hare LPN (March 19, 2010 4:14 PM)  O2 Flow:  Room air CC: cpe Is Patient Diabetic? No   Primary Care Provider:  Syliva Overman MD  CC:  cpe.  History of Present Illness: Reports  thatshe has been doing well. Denies recent fever or chills. Denies sinus pressure, nasal congestion , ear pain or sore throat. Denies chest congestion, or cough productive of sputum. Denies chest pain, palpitations, PND, orthopnea or leg swelling. Denies abdominal pain, nausea, vomitting, diarrhea or constipation. Denies change in bowel movements or bloody stool. Denies dysuria , frequency, incontinence or hesitancy. Denies  joint pain, swelling, or reduced mobility. Denies headaches, vertigo, seizures. Denies depression, anxiety or insomnia. Denies  rash, lesions, or itch. Her nicotine use is unchanged, no quit date is being set. Wants to continue med for menorrhagia     Preventive Screening-Counseling & Management  Alcohol-Tobacco     Smoking Cessation Counseling: yes  Current Medications (verified): 1)  Megestrol Acetate 20 Mg Tabs (Megestrol Acetate) .... Two Tabs By Mouth Once Daily 2)  Slow Fe 160 (50 Fe) Mg Cr-Tabs (Ferrous Sulfate Dried) .... One Tab By Mouth Once Daily 3)  Bisoprolol-Hydrochlorothiazide 5-6.25 Mg Tabs (Bisoprolol-Hydrochlorothiazide) .... Take 1 Tablet By Mouth Every Morning  Allergies (verified): 1)  ! Maxzide-25 (Triamterene-Hctz)  Review of Systems      See HPI General:  Complains of fatigue. Eyes:  Complains of vision loss-both eyes; denies discharge, eye pain, and red eye. Endo:  Denies cold intolerance, excessive hunger,  excessive thirst, excessive urination, heat intolerance, and polyuria. Heme:  Denies abnormal bruising, bleeding, and enlarge lymph nodes. Allergy:  Complains of seasonal allergies; denies hives or rash and itching eyes.  Physical Exam  General:  Well-developed,well-nourished,in no acute distress; alert,appropriate and cooperative throughout examination Eyes:  No corneal or conjunctival inflammation noted. EOMI. Perrla. Funduscopic exam benign, without hemorrhages, exudates or papilledema. Vision grossly normal. Ears:  External ear exam shows no significant lesions or deformities.  Otoscopic examination reveals clear canals, tympanic membranes are intact bilaterally without bulging, retraction, inflammation or discharge. Hearing is grossly normal bilaterally. Nose:  External nasal examination shows no deformity or inflammation. Nasal mucosa are pink and moist without lesions or exudates. Mouth:  Oral mucosa and oropharynx without lesions or exudates.  Teeth in good repair. Neck:  No deformities, masses, or tenderness noted. Chest Wall:  No deformities, masses, or tenderness noted. Breasts:  No mass, nodules, thickening, tenderness, bulging, retraction, inflamation, nipple discharge or skin changes noted.   Lungs:  Normal respiratory effort, chest expands symmetrically. Lungs are clear to auscultation, no crackles or wheezes.decreased air entry throughout Heart:  Normal rate and regular rhythm. S1 and S2 normal without gallop, murmur, click, rub or other extra sounds. Abdomen:  Bowel sounds positive,abdomen soft and non-tender without masses, organomegaly or hernias noted. Rectal:  No external abnormalities noted. Normal sphincter tone. No rectal masses or tenderness. Genitalia:  Normal introitus for age, no external lesions, no vaginal discharge, mucosa pink and moist,  no vaginal or cervical lesions, no vaginal atrophy, no friaility or hemorrhage, normal uterus size and position, no adnexal masses  or tenderness Msk:  No deformity or scoliosis noted of thoracic or lumbar spine.   Pulses:  R and L carotid,radial,femoral,dorsalis pedis and posterior tibial pulses are full and equal bilaterally Extremities:  No clubbing, cyanosis, edema, or deformity noted with normal full range of motion of all joints.   Neurologic:  No cranial nerve deficits noted. Station and gait are normal. Plantar reflexes are down-going bilaterally. DTRs are symmetrical throughout. Sensory, motor and coordinative functions appear intact. Skin:  Intact without suspicious lesions or rashes Cervical Nodes:  No lymphadenopathy noted Axillary Nodes:  No palpable lymphadenopathy Inguinal Nodes:  No significant adenopathy Psych:  Cognition and judgment appear intact. Alert and cooperative with normal attention span and concentration. No apparent delusions, illusions, hallucinations   Impression & Recommendations:  Problem # 1:  MENORRHAGIA (ICD-626.2) Assessment Improved on hormonal therapy  Problem # 2:  HYPERTENSION (ICD-401.9) Assessment: Improved  Her updated medication list for this problem includes:    Bisoprolol-hydrochlorothiazide 5-6.25 Mg Tabs (Bisoprolol-hydrochlorothiazide) .Marland Kitchen... Take 1 tablet by mouth every morning  BP today: 110/80 Prior BP: 130/80 (06/17/2009)  Labs Reviewed: K+: 4.4 (03/04/2007) Creat: : 0.85 (03/04/2007)   Chol: 197 (03/04/2007)   HDL: 59 (03/04/2007)   LDL: 119 (03/04/2007)   TG: 96 (03/04/2007)  Problem # 3:  NICOTINE ADDICTION (ICD-305.1) Assessment: Unchanged  Encouraged smoking cessation and discussed different methods for smoking cessation.   Problem # 4:  HYPERLIPIDEMIA (ICD-272.4) Assessment: Comment Only    HDL:59 (03/04/2007)  LDL:119 (03/04/2007)  Chol:197 (03/04/2007)  Trig:96 (03/04/2007) Low fat dietdiscussed and encouraged labs past due  Complete Medication List: 1)  Megestrol Acetate 20 Mg Tabs (Megestrol acetate) .... Two tabs by mouth once daily 2)   Slow Fe 160 (50 Fe) Mg Cr-tabs (Ferrous sulfate dried) .... One tab by mouth once daily 3)  Bisoprolol-hydrochlorothiazide 5-6.25 Mg Tabs (Bisoprolol-hydrochlorothiazide) .... Take 1 tablet by mouth every morning  Other Orders: Pap Smear ([address]) Hemoccult Guaiac-1 spec.(in office) (60454) Radiology Referral (Radiology)  Patient Instructions: 1)  Follow up appointment in 5.40months 2)  Tobacco is very bad for your health and your loved ones! You Should stop smoking!. 3)  Stop Smoking Tips: Choose a Quit date. Cut down before the Quit date. decide what you will do as a substitute when you feel the urge to smoke(gum,toothpick,exercise). 4)  It is important that you exercise regularly at least 20 minutes 5 times a week. If you develop chest pain, have severe difficulty breathing, or feel very tired , stop exercising immediately and seek medical attention. 5)  I will contact gynae to get some guidance as far as continued therapy for menorragia is concrened and let you know. 6)  Continue current BP med 7)  BMP prior to visit, ICD-9: 8)  Lipid Panel prior to visit, ICD-9:  fasting labs past due 9)  TSH prior to visit, ICD-9: 10)  CBC w/ Diff prior to visit, ICD-9:   Orders Added: 1)  Pap Smear [88150] 2)  Hemoccult Guaiac-1 spec.(in office) [82270] 3)  Est. Patient 40-64 years [99396] 4)  Radiology Referral [Radiology]    Laboratory Results  Date/Time Received: March 20, 2010 10:21 AM  Date/Time Reported: March 20, 2010 10:21 AM   Stool - Occult Blood Hemmoccult #1: negative Date: 03/20/2010 Comments: 51301 13l 10/13 5030 5/14 Adella Hare LPN  March 20, 2010 10:21 AM    Appended Document:  phy

## 2010-04-01 NOTE — Progress Notes (Signed)
Summary: medicine  Phone Note Call from Patient   Summary of Call: needs her megestrol filled at [facility] for 3 months Initial call taken by: Lind Guest,  March 23, 2010 9:15 AM  Follow-up for Phone Call        refill 1 month only pls i am waiting on a response from Dr Despina Hidden like I discussed at [facility] Follow-up by: Syliva Overman MD,  March 23, 2010 12:30 PM    Prescriptions: MEGESTROL ACETATE 20 MG TABS (MEGESTROL ACETATE) two tabs by mouth once daily  #60 x 0   Entered by:   Adella Hare LPN   Authorized by:   Syliva Overman MD   Signed by:   Adella Hare LPN on 16/11/9602   Method used:   Electronically to        [facility]  [address] (retail)       [address]       [place], [place]  [address]       Ph: [phone]       Fax: [phone]   RxID:   [id number]

## 2010-04-01 NOTE — Letter (Signed)
Summary: Pap Smear, Normal Letter, [facility]  [address]   [place], [place] [address]   Phone: [phone]  Fax: [phone]          March 24, 2010    Dear: Catherine Mclean    I am pleased to notify you that your PAP smear was normal.  You will need your next PAP smear in:     ____ 3 Months    ____ 6 Months    ____ 12 Months    Please call the office at our office number above, to schedule your next appointment.    Sincerely,     [facility]

## 2010-05-12 LAB — POCT CARDIAC MARKERS
CKMB, poc: 1.5 ng/mL (ref 1.0–8.0)
Myoglobin, poc: 106 ng/mL (ref 12–200)
Troponin i, poc: <0.05 ng/mL (ref 0.00–0.09)

## 2010-05-12 LAB — CBC
HCT: 44 % (ref 36.0–46.0)
Hemoglobin: 15.2 g/dL — ABNORMAL HIGH (ref 12.0–15.0)
MCHC: 34.7 g/dL (ref 30.0–36.0)
MCV: 85.7 fL (ref 78.0–100.0)
Platelets: 195 10*3/uL (ref 150–400)
RBC: 5.13 MIL/uL — ABNORMAL HIGH (ref 3.87–5.11)
RDW: 13.8 % (ref 11.5–15.5)
WBC: 10.3 10*3/uL (ref 4.0–10.5)

## 2010-05-12 LAB — COMPREHENSIVE METABOLIC PANEL
ALT: 20 U/L (ref 0–35)
AST: 45 U/L — ABNORMAL HIGH (ref 0–37)
Albumin: 4.8 g/dL (ref 3.5–5.2)
Alkaline Phosphatase: 99 U/L (ref 39–117)
BUN: 7 mg/dL (ref 6–23)
CO2: 25 mEq/L (ref 19–32)
Calcium: 10.2 mg/dL (ref 8.4–10.5)
Chloride: 98 mEq/L (ref 96–112)
Creatinine, Ser: 0.84 mg/dL (ref 0.4–1.2)
GFR calc Af Amer: >60 mL/min (ref >60)
GFR calc non Af Amer: >60 mL/min (ref >60)
Glucose, Bld: 101 mg/dL — ABNORMAL HIGH (ref 70–99)
Potassium: 3.1 mEq/L — ABNORMAL LOW (ref 3.5–5.1)
Sodium: 135 mEq/L (ref 135–145)
Total Bilirubin: 0.8 mg/dL (ref 0.3–1.2)
Total Protein: 6.7 g/dL (ref 6.0–8.3)

## 2010-05-12 LAB — DIFFERENTIAL
Basophils Absolute: 0.1 10*3/uL (ref 0.0–0.1)
Basophils Relative: 1 % (ref 0–1)
Eosinophils Absolute: 0.1 10*3/uL (ref 0.0–0.7)
Eosinophils Relative: 1 % (ref 0–5)
Lymphocytes Relative: 25 % (ref 12–46)
Lymphs Abs: 2.6 10*3/uL (ref 0.7–4.0)
Monocytes Absolute: 0.5 10*3/uL (ref 0.1–1.0)
Monocytes Relative: 5 % (ref 3–12)
Neutro Abs: 7 10*3/uL (ref 1.7–7.7)
Neutrophils Relative %: 68 % (ref 43–77)

## 2010-05-19 ENCOUNTER — Other Ambulatory Visit: Payer: Self-pay | Admitting: Family Medicine

## 2010-07-22 ENCOUNTER — Other Ambulatory Visit: Payer: Self-pay | Admitting: Family Medicine

## 2010-07-23 ENCOUNTER — Other Ambulatory Visit: Payer: Self-pay | Admitting: Family Medicine

## 2010-11-06 ENCOUNTER — Ambulatory Visit: Payer: No Typology Code available for payment source | Admitting: Family Medicine

## 2010-11-06 DIAGNOSIS — Z23 Encounter for immunization: Secondary | ICD-10-CM

## 2010-11-09 MED ORDER — INFLUENZA VAC TYPES A & B PF IM SUSP: 0.5000 mL | Freq: Once | INTRAMUSCULAR | Status: DC

## 2010-11-27 LAB — CBC
HCT: 41.3 % (ref 36.0–46.0)
Hemoglobin: 13.9 g/dL (ref 12.0–15.0)
MCHC: 33.5 g/dL (ref 30.0–36.0)
MCV: 85.9 fL (ref 78.0–100.0)
Platelets: 227 10*3/uL (ref 150–400)
RBC: 4.82 MIL/uL (ref 3.87–5.11)
RDW: 14.4 % (ref 11.5–15.5)
WBC: 9.6 10*3/uL (ref 4.0–10.5)

## 2010-11-27 LAB — PREGNANCY, URINE: Preg Test, Ur: NEGATIVE

## 2010-11-27 LAB — URINALYSIS, ROUTINE W REFLEX MICROSCOPIC
Bilirubin Urine: NEGATIVE
Glucose, UA: NEGATIVE mg/dL
Ketones, ur: NEGATIVE mg/dL
Leukocytes, UA: NEGATIVE
Nitrite: NEGATIVE
Protein, ur: NEGATIVE mg/dL
Specific Gravity, Urine: 1.005 (ref 1.005–1.030)
Urobilinogen, UA: 0.2 mg/dL (ref 0.0–1.0)
pH: 6.5 (ref 5.0–8.0)

## 2010-11-27 LAB — URINE MICROSCOPIC-ADD ON

## 2010-11-27 LAB — DIFFERENTIAL
Basophils Absolute: 0 10*3/uL (ref 0.0–0.1)
Basophils Relative: 0 % (ref 0–1)
Eosinophils Absolute: 0.1 10*3/uL (ref 0.0–0.7)
Eosinophils Relative: 1 % (ref 0–5)
Lymphocytes Relative: 32 % (ref 12–46)
Lymphs Abs: 3.1 10*3/uL (ref 0.7–4.0)
Monocytes Absolute: 0.4 10*3/uL (ref 0.1–1.0)
Monocytes Relative: 4 % (ref 3–12)
Neutro Abs: 6 10*3/uL (ref 1.7–7.7)
Neutrophils Relative %: 62 % (ref 43–77)

## 2010-11-27 LAB — POCT CARDIAC MARKERS
CKMB, poc: 1.6 ng/mL (ref 1.0–8.0)
Myoglobin, poc: 167 ng/mL (ref 12–200)
Troponin i, poc: <0.05 ng/mL (ref 0.00–0.09)

## 2010-11-27 LAB — BASIC METABOLIC PANEL
BUN: 6 mg/dL (ref 6–23)
CO2: 28 mEq/L (ref 19–32)
Calcium: 9.2 mg/dL (ref 8.4–10.5)
Chloride: 102 mEq/L (ref 96–112)
Creatinine, Ser: 0.88 mg/dL (ref 0.4–1.2)
GFR calc Af Amer: >60 mL/min (ref >60)
GFR calc non Af Amer: >60 mL/min (ref >60)
Glucose, Bld: 102 mg/dL — ABNORMAL HIGH (ref 70–99)
Potassium: 3.1 mEq/L — ABNORMAL LOW (ref 3.5–5.1)
Sodium: 135 mEq/L (ref 135–145)

## 2010-12-30 ENCOUNTER — Other Ambulatory Visit: Payer: Self-pay | Admitting: Family Medicine

## 2011-04-02 ENCOUNTER — Other Ambulatory Visit: Payer: Self-pay | Admitting: Family Medicine

## 2011-04-06 ENCOUNTER — Telehealth: Payer: Self-pay | Admitting: Family Medicine

## 2011-04-06 MED ORDER — BISOPROLOL-HYDROCHLOROTHIAZIDE 5-6.25 MG PO TABS: ORAL_TABLET | ORAL | Status: DC

## 2011-04-07 ENCOUNTER — Other Ambulatory Visit: Payer: Self-pay | Admitting: Family Medicine

## 2011-04-07 NOTE — Telephone Encounter (Signed)
Refill sent in and called her number and mother answered and I advised her to let her know that I had taken care of her request

## 2011-04-08 ENCOUNTER — Telehealth: Payer: Self-pay | Admitting: Family Medicine

## 2011-04-08 NOTE — Telephone Encounter (Signed)
Left message for pt to return call.

## 2011-04-08 NOTE — Telephone Encounter (Signed)
Deny needs ov

## 2011-04-08 NOTE — Telephone Encounter (Signed)
Do you want to refill this med?

## 2011-04-08 NOTE — Telephone Encounter (Signed)
Refill denied. Pt has an appt on 2/18

## 2011-04-08 NOTE — Telephone Encounter (Signed)
Pt has appointment on 2-18

## 2011-04-12 ENCOUNTER — Other Ambulatory Visit (HOSPITAL_COMMUNITY)
Admission: RE | Admit: 2011-04-12 | Discharge: 2011-04-12 | Disposition: A | Discharge status: Home / Self Care | Payer: PRIVATE HEALTH INSURANCE | Source: Ambulatory Visit | Attending: Family Medicine | Admitting: Family Medicine

## 2011-04-12 ENCOUNTER — Ambulatory Visit (INDEPENDENT_AMBULATORY_CARE_PROVIDER_SITE_OTHER): Payer: PRIVATE HEALTH INSURANCE | Admitting: Family Medicine

## 2011-04-12 ENCOUNTER — Encounter: Payer: Self-pay | Admitting: Family Medicine

## 2011-04-12 VITALS — BP 126/80 | HR 58 | Resp 18 | Ht 64.0 in | Wt 142.1 lb

## 2011-04-12 DIAGNOSIS — E876 Hypokalemia: Secondary | ICD-10-CM

## 2011-04-12 DIAGNOSIS — Z0000 Encounter for general adult medical examination without abnormal findings: Secondary | ICD-10-CM

## 2011-04-12 DIAGNOSIS — N925 Other specified irregular menstruation: Secondary | ICD-10-CM

## 2011-04-12 DIAGNOSIS — N926 Irregular menstruation, unspecified: Secondary | ICD-10-CM

## 2011-04-12 DIAGNOSIS — N949 Unspecified condition associated with female genital organs and menstrual cycle: Secondary | ICD-10-CM

## 2011-04-12 DIAGNOSIS — Z01419 Encounter for gynecological examination (general) (routine) without abnormal findings: Secondary | ICD-10-CM | POA: Insufficient documentation

## 2011-04-12 DIAGNOSIS — E785 Hyperlipidemia, unspecified: Secondary | ICD-10-CM

## 2011-04-12 DIAGNOSIS — F17200 Nicotine dependence, unspecified, uncomplicated: Secondary | ICD-10-CM

## 2011-04-12 DIAGNOSIS — I10 Essential (primary) hypertension: Secondary | ICD-10-CM

## 2011-04-12 DIAGNOSIS — N939 Abnormal uterine and vaginal bleeding, unspecified: Secondary | ICD-10-CM

## 2011-04-12 DIAGNOSIS — N938 Other specified abnormal uterine and vaginal bleeding: Secondary | ICD-10-CM

## 2011-04-12 LAB — POC HEMOCCULT BLD/STL (OFFICE/1-CARD/DIAGNOSTIC): Fecal Occult Blood, POC: NEGATIVE

## 2011-04-12 NOTE — Progress Notes (Signed)
  Subjective:    Patient ID: Catherine Mclean, female    DOB: 07-30-1960, 51 y.o.   MRN: [id number]  HPI The PT is here for annual exam and re-evaluation of chronic medical conditions, medication management and review of any available recent lab and radiology data.  Preventive health is updated, specifically  Cancer screening    Questions or concerns regarding consultations or procedures which the PT has had in the interim are  addressed. The PT denies any adverse reactions to current medications since the last visit.  There are no new concerns. Except new stress of ill parent who may well have lung cancer Wants to continue megace indefinitely as this controls menstrual pain and bleeding , advised her I will need guidance from gynae about this    Review of Systems See HPI Denies recent fever or chills. Denies sinus pressure, nasal congestion, ear pain or sore throat. Denies chest congestion, productive cough or wheezing. Denies chest pains, palpitations and leg swelling Denies abdominal pain, nausea, vomiting,diarrhea or constipation.   Denies dysuria, frequency, hesitancy or incontinence. Denies joint pain, swelling and limitation in mobility. Denies headaches, seizures, numbness, or tingling. Denies depression, anxiety or insomnia. Denies skin break down or rash.        Objective:   Physical Exam  Pleasant well nourished female, alert and oriented x 3, in no cardio-pulmonary distress. Afebrile. HEENT No facial trauma or asymetry. Sinuses non tender.  EOMI, PERTL, fundoscopic exam is normal, no hemorhage or exudate.  External ears normal, tympanic membranes clear. Oropharynx moist, no exudate,poor dentition. Neck: supple, no adenopathy,JVD or thyromegaly.No bruits.  Chest: Clear to ascultation bilaterally.No crackles or wheezes. Decreased air entry throughout Non tender to palpation  Breast: No asymetry,no masses. No nipple discharge or inversion. No axillary or  supraclavicular adenopathy  Cardiovascular system; Heart sounds normal,  S1 and  S2 ,no S3.  No murmur, or thrill. Apical beat not displaced Peripheral pulses normal.  Abdomen: Soft, non tender, no organomegaly or masses. No bruits. Bowel sounds normal. No guarding, tenderness or rebound.  Rectal:  No mass. Guaiac negative stool.  GU: External genitalia normal. No lesions. Vaginal canal normal.No discharge. Uterus enlarged, no adnexal masses, no cervical motion or adnexal tenderness.  Musculoskeletal exam: Full ROM of spine, hips , shoulders and knees. No deformity ,swelling or crepitus noted. No muscle wasting or atrophy.   Neurologic: Cranial nerves 2 to 12 intact. Power, tone ,sensation and reflexes normal throughout. No disturbance in gait. No tremor.  Skin: Intact, no ulceration, erythema , scaling or rash noted. Pigmentation normal throughout  Psych; Normal mood and affect. Judgement and concentration normal       Assessment & Plan:

## 2011-04-12 NOTE — Patient Instructions (Addendum)
Cpe in 12 month  Labs today, cbc, tsh, chem 7 , random cholesterol and vit D  Continue current meds.  I will get back in touch with you after I have a response from gynae re the megace, I will initially fill for 3 months at current dose  You need to stop smoking, -please work hard at this  Mammogram past due this will be scheduled after 4 in March per your request  You need a colonoscopy, this is recommended every 10 years after age 53 to screen for colon cancer

## 2011-04-12 NOTE — Assessment & Plan Note (Signed)
Cessation counseling done 

## 2011-04-12 NOTE — Assessment & Plan Note (Signed)
Controlled, no change in medication  

## 2011-04-12 NOTE — Assessment & Plan Note (Signed)
Hyperlipidemia:Low fat diet discussed and encouraged.  Updated labs needed 

## 2011-04-12 NOTE — Assessment & Plan Note (Signed)
Currently on megace 20 mg daily, need update from gynae as to duration of appropriate treatment, letter sent, will sched appt with gynae if this is requested, pt aware

## 2011-04-13 ENCOUNTER — Other Ambulatory Visit: Payer: Self-pay

## 2011-04-13 ENCOUNTER — Telehealth: Payer: Self-pay | Admitting: Family Medicine

## 2011-04-13 ENCOUNTER — Other Ambulatory Visit: Payer: Self-pay | Admitting: Family Medicine

## 2011-04-13 LAB — BASIC METABOLIC PANEL
BUN: 10 mg/dL (ref 6–23)
CO2: 26 mEq/L (ref 19–32)
Calcium: 9.5 mg/dL (ref 8.4–10.5)
Chloride: 106 mEq/L (ref 96–112)
Creat: 0.85 mg/dL (ref 0.50–1.10)
Glucose, Bld: 93 mg/dL (ref 70–99)
Potassium: 4.1 mEq/L (ref 3.5–5.3)
Sodium: 141 mEq/L (ref 135–145)

## 2011-04-13 LAB — CBC
HCT: 41.2 % (ref 36.0–46.0)
Hemoglobin: 13.4 g/dL (ref 12.0–15.0)
MCH: 27.8 pg (ref 26.0–34.0)
MCHC: 32.5 g/dL (ref 30.0–36.0)
MCV: 85.5 fL (ref 78.0–100.0)
Platelets: 213 10*3/uL (ref 150–400)
RBC: 4.82 MIL/uL (ref 3.87–5.11)
RDW: 14.6 % (ref 11.5–15.5)
WBC: 9.4 10*3/uL (ref 4.0–10.5)

## 2011-04-13 LAB — VITAMIN D 25 HYDROXY (VIT D DEFICIENCY, FRACTURES): Vit D, 25-Hydroxy: 20 ng/mL — ABNORMAL LOW (ref 30–89)

## 2011-04-13 LAB — TSH: TSH: 0.923 u[IU]/mL (ref 0.350–4.500)

## 2011-04-13 LAB — CHOLESTEROL, TOTAL: Cholesterol: 183 mg/dL (ref 0–200)

## 2011-04-13 MED ORDER — BISOPROLOL-HYDROCHLOROTHIAZIDE 5-6.25 MG PO TABS: 1.0000 | ORAL_TABLET | Freq: Every day | ORAL | Status: DC

## 2011-04-13 MED ORDER — VITAMIN D3 1.25 MG (50000 UT) PO CAPS: 50000.0000 [IU] | ORAL_CAPSULE | ORAL | Status: DC

## 2011-04-13 MED ORDER — MEGESTROL ACETATE 20 MG PO TABS: 20.0000 mg | ORAL_TABLET | Freq: Every day | ORAL | Status: DC

## 2011-04-13 NOTE — Telephone Encounter (Signed)
pls let pt know dr Despina Hidden said to keep her on the megace for one additional year, this year and try to stop next year. pls refill megace for 1`2 months also

## 2011-04-16 ENCOUNTER — Other Ambulatory Visit: Payer: Self-pay

## 2011-04-16 MED ORDER — MEGESTROL ACETATE 20 MG PO TABS: 20.0000 mg | ORAL_TABLET | Freq: Every day | ORAL | Status: DC

## 2011-04-16 NOTE — Telephone Encounter (Signed)
Called and left message for pt to call back for lab results and message.

## 2011-04-16 NOTE — Telephone Encounter (Signed)
Pt aware.

## 2011-04-26 ENCOUNTER — Ambulatory Visit (HOSPITAL_COMMUNITY): Payer: PRIVATE HEALTH INSURANCE

## 2011-09-01 ENCOUNTER — Other Ambulatory Visit: Payer: Self-pay | Admitting: Family Medicine

## 2011-11-25 ENCOUNTER — Ambulatory Visit (INDEPENDENT_AMBULATORY_CARE_PROVIDER_SITE_OTHER): Payer: PRIVATE HEALTH INSURANCE

## 2011-11-25 DIAGNOSIS — Z23 Encounter for immunization: Secondary | ICD-10-CM

## 2012-01-05 ENCOUNTER — Other Ambulatory Visit: Payer: Self-pay | Admitting: Family Medicine

## 2012-04-12 ENCOUNTER — Encounter: Payer: Self-pay | Admitting: Family Medicine

## 2012-04-12 ENCOUNTER — Ambulatory Visit (INDEPENDENT_AMBULATORY_CARE_PROVIDER_SITE_OTHER): Payer: BC Managed Care – PPO | Admitting: Family Medicine

## 2012-04-12 VITALS — BP 126/74 | HR 72 | Resp 18 | Ht 64.0 in | Wt 144.1 lb

## 2012-04-12 DIAGNOSIS — Z0000 Encounter for general adult medical examination without abnormal findings: Secondary | ICD-10-CM

## 2012-04-12 DIAGNOSIS — R5381 Other malaise: Secondary | ICD-10-CM

## 2012-04-12 DIAGNOSIS — F17200 Nicotine dependence, unspecified, uncomplicated: Secondary | ICD-10-CM

## 2012-04-12 DIAGNOSIS — E876 Hypokalemia: Secondary | ICD-10-CM

## 2012-04-12 DIAGNOSIS — I10 Essential (primary) hypertension: Secondary | ICD-10-CM

## 2012-04-12 DIAGNOSIS — Z23 Encounter for immunization: Secondary | ICD-10-CM

## 2012-04-12 DIAGNOSIS — R5383 Other fatigue: Secondary | ICD-10-CM

## 2012-04-12 DIAGNOSIS — E785 Hyperlipidemia, unspecified: Secondary | ICD-10-CM

## 2012-04-12 DIAGNOSIS — Z1211 Encounter for screening for malignant neoplasm of colon: Secondary | ICD-10-CM

## 2012-04-12 NOTE — Patient Instructions (Addendum)
F/U in 12 month for CPE, call if you need me before   cbc, chem 7, lipid, TSH, vit  as soon as possible  TdAP today   Please schedule your mammogram  Please consider the colonoscopy, you need one

## 2012-04-13 ENCOUNTER — Other Ambulatory Visit (HOSPITAL_COMMUNITY)
Admission: RE | Admit: 2012-04-13 | Discharge: 2012-04-13 | Disposition: A | Discharge status: Home / Self Care | Payer: BC Managed Care – PPO | Source: Ambulatory Visit | Attending: Family Medicine | Admitting: Family Medicine

## 2012-04-13 DIAGNOSIS — Z1151 Encounter for screening for human papillomavirus (HPV): Secondary | ICD-10-CM | POA: Insufficient documentation

## 2012-04-13 DIAGNOSIS — Z01419 Encounter for gynecological examination (general) (routine) without abnormal findings: Secondary | ICD-10-CM | POA: Insufficient documentation

## 2012-04-13 LAB — POC HEMOCCULT BLD/STL (OFFICE/1-CARD/DIAGNOSTIC): Fecal Occult Blood, POC: NEGATIVE

## 2012-04-16 DIAGNOSIS — Z0000 Encounter for general adult medical examination without abnormal findings: Secondary | ICD-10-CM | POA: Insufficient documentation

## 2012-04-16 NOTE — Assessment & Plan Note (Signed)
Controlled, no change in medication DASH diet and commitment to daily physical activity for a minimum of 30 minutes discussed and encouraged, as a part of hypertension management. The importance of attaining a healthy weight is also discussed.  

## 2012-04-16 NOTE — Assessment & Plan Note (Signed)
Deteriorated with increase use due to stress. No interest in quitting at this time  Patient counseled for approximately 5 minutes regarding the health risks of ongoing nicotine use, specifically all types of cancer, heart disease, stroke and respiratory failure. The options available for help with cessation ,the behavioral changes to assist the process, and the option to either gradully reduce usage  Or abruptly stop.is also discussed. Pt is also encouraged to set specific goals in number of cigarettes used daily, as well as to set a quit date.'

## 2012-04-16 NOTE — Progress Notes (Signed)
  Subjective:    Patient ID: Catherine Mclean, female    DOB: 02-Jul-1960, 52 y.o.   MRN: [id number]  HPI The PT is here for annual exam  and re-evaluation of chronic medical conditions, medication management and review of any available recent lab and radiology data.  Preventive health is updated, specifically  Cancer screening and Immunization. Still refusing colon cancer screening at this time  . The PT denies any adverse reactions to current medications since the last visit.  Tearful and concerned re deteriorating health in her parents, thinks of Mom as being selfish not to want dialysis, trying to deal with this     Review of Systems    See HPI Denies recent fever or chills. Denies sinus pressure, nasal congestion, ear pain or sore throat. Denies chest congestion, productive cough or wheezing. Denies chest pains, palpitations and leg swelling Denies abdominal pain, nausea, vomiting,diarrhea or constipation.   Denies dysuria, frequency, hesitancy or incontinence. Denies joint pain, swelling and limitation in mobility. Denies headaches, seizures, numbness, or tingling. Denies skin break down or rash.     Objective:   Physical Exam Pleasant well nourished female, alert and oriented x 3, in no cardio-pulmonary distress. Afebrile. HEENT No facial trauma or asymetry. Sinuses non tender.  EOMI, PERTL, fundoscopic exam , no hemorhage or exudate.  External ears normal, tympanic membranes clear. Oropharynx moist, no exudate, fair dentition. Neck: supple, no adenopathy,JVD or thyromegaly.No bruits.  Chest: Clear to ascultation bilaterally.No crackles or wheezes.Decreased though adequate air entry Non tender to palpation  Breast: No asymetry,no masses. No nipple discharge or inversion.No skin lesions No axillary or supraclavicular adenopathy  Cardiovascular system; Heart sounds normal,  S1 and  S2 ,no S3.  No murmur, or thrill. Apical beat not displaced Peripheral pulses  normal.  Abdomen: Soft, non tender, no organomegaly or masses. No bruits. Bowel sounds normal. No guarding, tenderness or rebound.  Rectal:  No mass. Guaiac negative stool.  GU: External genitalia normal. No skin  Lesions.[place] distribution of hair Vaginal canal normal.No discharge. Uterus normal size, no adnexal masses, no cervical motion or adnexal tenderness.  Musculoskeletal exam: Full ROM of spine, hips , shoulders and knees. No deformity ,swelling or crepitus noted. No muscle wasting or atrophy.   Neurologic: Cranial nerves 2 to 12 intact. Power, tone ,sensation and reflexes normal throughout. No disturbance in gait. No tremor.  Skin: Intact, no ulceration, erythema , scaling or rash noted. Pigmentation normal throughout  Psych; Normal mood tearful affect. Judgement and concentration normal        Assessment & Plan:

## 2012-04-16 NOTE — Assessment & Plan Note (Signed)
Pelvic, breast and rectal exam completed as documented, as well as complete med exam Pt's nicotine dependence has worsened, and she has no interest i quitting at this tim, she is counseled for 5 mins. Her mental health is challenged by deteriorating health in her parents, no interest in medication or counseling at this time

## 2012-05-06 ENCOUNTER — Other Ambulatory Visit: Payer: Self-pay | Admitting: Family Medicine

## 2012-06-29 ENCOUNTER — Telehealth: Payer: Self-pay | Admitting: Family Medicine

## 2012-06-30 ENCOUNTER — Encounter: Payer: Self-pay | Admitting: Family Medicine

## 2012-06-30 ENCOUNTER — Ambulatory Visit (INDEPENDENT_AMBULATORY_CARE_PROVIDER_SITE_OTHER): Payer: BC Managed Care – PPO | Admitting: Family Medicine

## 2012-06-30 VITALS — BP 120/78 | HR 70 | Temp 98.4°F | Resp 18 | Ht 64.0 in | Wt 143.0 lb

## 2012-06-30 DIAGNOSIS — J069 Acute upper respiratory infection, unspecified: Secondary | ICD-10-CM

## 2012-06-30 MED ORDER — AZITHROMYCIN 250 MG PO TABS: ORAL_TABLET | ORAL | Status: AC

## 2012-06-30 MED ORDER — GUAIFENESIN-CODEINE 100-10 MG/5ML PO SYRP: 5.0000 mL | ORAL_SOLUTION | Freq: Four times a day (QID) | ORAL | Status: DC | PRN

## 2012-06-30 NOTE — Telephone Encounter (Signed)
Has tried OTC robitussin and sudafed. No better after a few days. Wants to go to urgent care. Sore throat and coughing /congestion

## 2012-06-30 NOTE — Patient Instructions (Addendum)
Start antibiotics Take cough medication Plenty of fluids Rest

## 2012-06-30 NOTE — Telephone Encounter (Signed)
Called patient and left message for them to return call at the office   

## 2012-07-02 DIAGNOSIS — J069 Acute upper respiratory infection, unspecified: Secondary | ICD-10-CM | POA: Insufficient documentation

## 2012-07-02 NOTE — Progress Notes (Signed)
  Subjective:    Patient ID: Catherine Mclean, female    DOB: November 21, 1960, 52 y.o.   MRN: [id number]  HPI  Pt here with worsening productive cough for past 3 days, fever, weakness, sore throat. Has been using robitussin OTC with minimal help. 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 + diarrhea x 1 day, mild nausea, no emesis, no CP  Review of Systems  GEN- + fatigue, +fever, weight loss,weakness, recent illness HEENT- denies eye drainage, change in vision,+ nasal discharge, CVS- denies chest pain, palpitations RESP- denies SOB,+ cough, wheeze ABD- denies N/V, +change in stools, abd pain Neuro- denies headache, dizziness, syncope, seizure activity      Objective:   Physical Exam GEN- NAD, alert and oriented x3, ill appearing HEENT- PERRL, EOMI, non injected sclera, pink conjunctiva, MMM, oropharynx mild injection, TM clear bilat no effusion, mild maxillary sinus tenderness, clear nares  Neck- Supple, no LAD CVS- RRR, no murmur RESP-harsh cough, no wheeze, upper airway congestion clears with cough in RUL,no crackles,  ABD-NABS,soft,NT,ND EXT- No edema Pulses- Radial 2+         Assessment & Plan:

## 2012-07-02 NOTE — Assessment & Plan Note (Addendum)
Based on exam she is exhibits more upper respiratory illness, but appears sicker than typical viral URI With smoking history and pneumonia contact will place her on antibiotics Anti-pyretics, fluids and rest, cough medicine given Given red flags No hypoxia today, vitals stable

## 2012-08-26 ENCOUNTER — Encounter (HOSPITAL_COMMUNITY): Payer: Self-pay

## 2012-08-26 ENCOUNTER — Other Ambulatory Visit: Payer: Self-pay

## 2012-08-26 ENCOUNTER — Emergency Department (HOSPITAL_COMMUNITY)
Admission: EM | Admit: 2012-08-26 | Discharge: 2012-08-26 | Discharge status: Against Medical Advice | Payer: BC Managed Care – PPO | Attending: Emergency Medicine | Admitting: Emergency Medicine

## 2012-08-26 DIAGNOSIS — F17200 Nicotine dependence, unspecified, uncomplicated: Secondary | ICD-10-CM | POA: Insufficient documentation

## 2012-08-26 DIAGNOSIS — Z79899 Other long term (current) drug therapy: Secondary | ICD-10-CM | POA: Insufficient documentation

## 2012-08-26 DIAGNOSIS — R0789 Other chest pain: Secondary | ICD-10-CM | POA: Insufficient documentation

## 2012-08-26 DIAGNOSIS — F411 Generalized anxiety disorder: Secondary | ICD-10-CM | POA: Insufficient documentation

## 2012-08-26 DIAGNOSIS — I10 Essential (primary) hypertension: Secondary | ICD-10-CM | POA: Insufficient documentation

## 2012-08-26 DIAGNOSIS — R21 Rash and other nonspecific skin eruption: Secondary | ICD-10-CM | POA: Insufficient documentation

## 2012-08-26 DIAGNOSIS — R42 Dizziness and giddiness: Secondary | ICD-10-CM | POA: Insufficient documentation

## 2012-08-26 DIAGNOSIS — R079 Chest pain, unspecified: Secondary | ICD-10-CM

## 2012-08-26 DIAGNOSIS — R0602 Shortness of breath: Secondary | ICD-10-CM | POA: Insufficient documentation

## 2012-08-26 HISTORY — DX: Essential (primary) hypertension: I10

## 2012-08-26 NOTE — ED Notes (Signed)
Pt states she is feeling much better and does not want the iv/labs ordered by dr Deretha Emory. Dr Deretha Emory aware and in to talk with pt.

## 2012-08-26 NOTE — ED Provider Notes (Signed)
History  This chart was scribed for Catherine Jakes, MD by Ardelia Mems, ED Scribe. This patient was seen in room APA01/APA01 and the patient's care was started at 4:11 PM.  CSN: [id number]  Arrival date & time 08/26/12  1516   Chief Complaint  Patient presents with  . Chest Pain    The history is provided by the patient and a relative. No language interpreter was used.   HPI Comments: Catherine Mclean is a 52 y.o. female with a hx of HTN who presents to the Emergency Department complaining of an episode of chest pain with associated anxiety earlier today. Pt was here visiting her mother today and was informed her mother was going to be admitted. Pt states that upon receiving this news, she had a sudden onset of anxiety, then became dizzy and light-headed, then started hyperventilating and experiencing SOB, and then had a sudden onset of "stabbing", non-radiating, "6/10" left sternal chest pain. She denies having a hx of chest pain, and states that her chest pain is now down to a "2/10". She states that her breathing has improved now. She also states that her vision did not get blurry. She denies nausea, vomiting, headache or any other symptoms. Pt is an occasional alcohol user and a current every day smoker of 0.5 packs/day.  PCP- Dr. Syliva Overman   Past Medical History  Diagnosis Date  . Hypertension    History reviewed. No pertinent past surgical history. No family history on file. History  Substance Use Topics  . Smoking status: Current Every Day Smoker -- 0.50 packs/day  . Smokeless tobacco: Not on file  . Alcohol Use: Yes   OB History   Grav Para Term Preterm Abortions TAB SAB Ect Mult Living                 Review of Systems  Constitutional: Negative for chills.  HENT: Negative for congestion, sore throat, rhinorrhea and neck pain.   Eyes: Negative for visual disturbance.  Respiratory: Positive for shortness of breath.   Cardiovascular: Positive for chest pain.  Negative for leg swelling.  Gastrointestinal: Negative for nausea, vomiting and diarrhea.  Genitourinary: Negative for dysuria.  Musculoskeletal: Negative for back pain.  Skin: Positive for rash.  Neurological: Positive for dizziness and light-headedness. Negative for headaches.  Hematological: Does not bruise/bleed easily.  Psychiatric/Behavioral: Negative for confusion. The patient is nervous/anxious.   A complete 10 system review of systems was obtained and all systems are negative except as noted in the HPI and PMH.    Allergies  Hydrochlorothiazide w-triamterene  Home Medications   Current Outpatient Rx  Name  Route  Sig  Dispense  Refill  . bisoprolol-hydrochlorothiazide (ZIAC) 5-6.25 MG per tablet   Oral   Take 1 tablet by mouth daily.         . megestrol (MEGACE) 40 MG tablet   Oral   Take 40 mg by mouth daily.          Triage Vitals: BP 175/88  Pulse 65  Temp(Src) 98.6 F (37 C) (Oral)  Resp 27  Ht 5\' 2"  (1.575 m)  Wt 143 lb (64.864 kg)  BMI 26.15 kg/m2  SpO2 100%  Physical Exam  Constitutional: She is oriented to person, place, and time. She appears well-developed and well-nourished.  HENT:  Head: Normocephalic and atraumatic.  Mouth/Throat: Oropharynx is clear and moist.  Eyes: EOM are normal. Pupils are equal, round, and reactive to light.  Neck: Normal range of motion.  Neck supple.  Cardiovascular: Normal rate, regular rhythm, normal heart sounds and intact distal pulses.   No murmur heard. Dorsalis pedis pulse on the left and right are each 2+.  No ankle swelling.  Pulmonary/Chest: Effort normal and breath sounds normal. No respiratory distress. She has no wheezes.  Abdominal: Soft. Bowel sounds are normal. There is no tenderness.  Musculoskeletal: Normal range of motion. She exhibits no tenderness.  Neurological: She is alert and oriented to person, place, and time.  Skin: Skin is warm and dry.  Psychiatric: She has a normal mood and affect. Her  behavior is normal.    ED Course  Procedures (including critical care time)  DIAGNOSTIC STUDIES: Oxygen Saturation is 100% on RA, normal by my interpretation.    COORDINATION OF CARE: 4:22 PM- Pt and relatives advised of plan for treatment and pt agrees. 4:31 PM- Recheck with pt to attempt to talk pt out of leaving AMA. She states that her chest pain is gone, but she is still anxious. Pt states that she has an appointment with her PCP Monday. Pt advised of all options and the costs and risks of leaving AMA and pt expresses concern that she does not want an IV in her. Pt is agreeable to receiving oral anti-anxiety medication. Pt cannot make up her mind.     Labs Reviewed - No data to display  No results found.  Results for orders placed in visit on 04/12/12  POC HEMOCCULT BLD/STL (OFFICE/1-CARD/DIAGNOSTIC)      Result Value Range   Card #1 Date 04/12/2012     Fecal Occult Blood, POC Negative       Date: 08/26/2012  Rate: 65  Rhythm: normal sinus rhythm  QRS Axis: normal  Intervals: normal  ST/T Wave abnormalities: normal  Conduction Disutrbances:none  Narrative Interpretation:   Old EKG Reviewed: none available and will     1. Chest pain     MDM   Patient with the acute onset of chest pain most likely anxiety related because got very upset about her mother being admitted to get lightheaded and dizzy almost passed out and got shortness of breath and hyperventilated and then very shortly after that got left substernal chest pain. However did plan her cup to be on safe side. Patient's EKG was normal no acute changes there was plan to treat the patient's anxiety while during the chest pain workup to include chest x-ray and some basic lab work. They should refused and left AMA. Patient was warned about the potential consequences and the potential seriousness of chest pain.        I personally performed the services described in this documentation, which was scribed in my  presence. The recorded information has been reviewed and is accurate.     Catherine Jakes, MD 08/26/12 [id number]

## 2012-08-26 NOTE — ED Notes (Signed)
Pt here with family member. Pt was inform her mother would be admit. to the hospital and pt began to have anxiety and chest pain.

## 2012-11-14 ENCOUNTER — Telehealth: Payer: Self-pay | Admitting: Family Medicine

## 2012-11-14 MED ORDER — MEGESTROL ACETATE 40 MG PO TABS: 40.0000 mg | ORAL_TABLET | Freq: Every day | ORAL | Status: DC

## 2012-12-13 MED ORDER — BISOPROLOL-HYDROCHLOROTHIAZIDE 5-6.25 MG PO TABS: 1.0000 | ORAL_TABLET | Freq: Every day | ORAL | Status: DC

## 2012-12-13 NOTE — Telephone Encounter (Signed)
Med refilled.

## 2012-12-13 NOTE — Telephone Encounter (Signed)
Needs her b p medicine sent to [facility] please

## 2013-03-13 ENCOUNTER — Other Ambulatory Visit: Payer: Self-pay | Admitting: Family Medicine

## 2013-04-09 ENCOUNTER — Encounter: Payer: BC Managed Care – PPO | Admitting: Family Medicine

## 2013-04-10 ENCOUNTER — Other Ambulatory Visit: Payer: Self-pay | Admitting: Family Medicine

## 2013-05-03 ENCOUNTER — Other Ambulatory Visit (HOSPITAL_COMMUNITY)
Admission: RE | Admit: 2013-05-03 | Discharge: 2013-05-03 | Disposition: A | Discharge status: Home / Self Care | Payer: BC Managed Care – PPO | Source: Ambulatory Visit | Attending: Family Medicine | Admitting: Family Medicine

## 2013-05-03 ENCOUNTER — Ambulatory Visit (INDEPENDENT_AMBULATORY_CARE_PROVIDER_SITE_OTHER): Payer: BC Managed Care – PPO | Admitting: Family Medicine

## 2013-05-03 ENCOUNTER — Encounter (INDEPENDENT_AMBULATORY_CARE_PROVIDER_SITE_OTHER): Payer: Self-pay

## 2013-05-03 ENCOUNTER — Encounter: Payer: Self-pay | Admitting: Family Medicine

## 2013-05-03 VITALS — BP 128/82 | HR 66 | Resp 18 | Ht 64.0 in | Wt 148.0 lb

## 2013-05-03 DIAGNOSIS — Z1239 Encounter for other screening for malignant neoplasm of breast: Secondary | ICD-10-CM

## 2013-05-03 DIAGNOSIS — G4700 Insomnia, unspecified: Secondary | ICD-10-CM

## 2013-05-03 DIAGNOSIS — F411 Generalized anxiety disorder: Secondary | ICD-10-CM

## 2013-05-03 DIAGNOSIS — Z91199 Patient's noncompliance with other medical treatment and regimen due to unspecified reason: Secondary | ICD-10-CM

## 2013-05-03 DIAGNOSIS — N938 Other specified abnormal uterine and vaginal bleeding: Secondary | ICD-10-CM

## 2013-05-03 DIAGNOSIS — N925 Other specified irregular menstruation: Secondary | ICD-10-CM

## 2013-05-03 DIAGNOSIS — Z1212 Encounter for screening for malignant neoplasm of rectum: Secondary | ICD-10-CM

## 2013-05-03 DIAGNOSIS — Z0000 Encounter for general adult medical examination without abnormal findings: Secondary | ICD-10-CM

## 2013-05-03 DIAGNOSIS — E785 Hyperlipidemia, unspecified: Secondary | ICD-10-CM

## 2013-05-03 DIAGNOSIS — N949 Unspecified condition associated with female genital organs and menstrual cycle: Secondary | ICD-10-CM

## 2013-05-03 DIAGNOSIS — Z9119 Patient's noncompliance with other medical treatment and regimen: Secondary | ICD-10-CM

## 2013-05-03 DIAGNOSIS — Z01419 Encounter for gynecological examination (general) (routine) without abnormal findings: Secondary | ICD-10-CM | POA: Insufficient documentation

## 2013-05-03 DIAGNOSIS — I10 Essential (primary) hypertension: Secondary | ICD-10-CM

## 2013-05-03 DIAGNOSIS — Z862 Personal history of diseases of the blood and blood-forming organs and certain disorders involving the immune mechanism: Secondary | ICD-10-CM

## 2013-05-03 DIAGNOSIS — Z1211 Encounter for screening for malignant neoplasm of colon: Secondary | ICD-10-CM

## 2013-05-03 DIAGNOSIS — F17200 Nicotine dependence, unspecified, uncomplicated: Secondary | ICD-10-CM

## 2013-05-03 LAB — POC HEMOCCULT BLD/STL (OFFICE/1-CARD/DIAGNOSTIC): Fecal Occult Blood, POC: NEGATIVE

## 2013-05-03 MED ORDER — TEMAZEPAM 15 MG PO CAPS: 15.0000 mg | ORAL_CAPSULE | Freq: Every evening | ORAL | Status: DC | PRN

## 2013-05-03 MED ORDER — BUSPIRONE HCL 5 MG PO TABS: 5.0000 mg | ORAL_TABLET | Freq: Three times a day (TID) | ORAL | Status: DC

## 2013-05-03 NOTE — Patient Instructions (Addendum)
F/u  In 5.5 months, call if you need me before  Labs need to be done as soon as possible, past due  Mammogram will be scheduled for a Monday after 5pm per your request we will call with appointment  Please work on cutting back cigarettes  Two new medications, as discussed, for anxiety and sleep, call if any problems please

## 2013-05-06 DIAGNOSIS — Z9119 Patient's noncompliance with other medical treatment and regimen: Secondary | ICD-10-CM | POA: Insufficient documentation

## 2013-05-06 DIAGNOSIS — Z91199 Patient's noncompliance with other medical treatment and regimen due to unspecified reason: Secondary | ICD-10-CM | POA: Insufficient documentation

## 2013-05-06 NOTE — Assessment & Plan Note (Signed)
Controlled, no change in medication DASH diet and commitment to daily physical activity for a minimum of 30 minutes discussed and encouraged, as a part of hypertension management. The importance of attaining a healthy weight is also discussed.  

## 2013-05-06 NOTE — Assessment & Plan Note (Signed)
Annual exam as documented. Counseling done  re healthy lifestyle involving commitment to 150 minutes exercise per week, heart healthy diet, and attaining healthy weight.The importance of adequate sleep also discussed. Regular seat belt use and safe storage  of firearms if patient has them, is also discussed. Changes in health habits are decided on by the patient with goals and time frames  set for achieving them. Immunization and cancer screening needs are specifically addressed at this visit.  

## 2013-05-06 NOTE — Progress Notes (Signed)
Subjective:    Patient ID: Catherine Mclean, female    DOB: September 02, 1960, 53 y.o.   MRN: [id number]  HPI The patient is for an annual exam. Health maintenance is reviewed and updated, specifically  recommended,  screening tests and immunizations. Needs  lab and radiologic data ,  Both are past due Continues to grieve her father's passing approx 9 months ago, c/o increased anxiety and poor sleep wants medication. Denies excessive alchohol use. States smoking is increased , up to 15 per day and does not feel as though she can commit to quitting at this time   Review of Systems See HPI Denies recent fever or chills. Denies sinus pressure, nasal congestion, ear pain or sore throat. Denies chest congestion, productive cough or wheezing. Denies chest pains, palpitations and leg swelling Denies abdominal pain, nausea, vomiting,diarrhea or constipation.   Denies dysuria, frequency, hesitancy or incontinence. Denies joint pain, swelling and limitation in mobility. Denies headaches, seizures, numbness, or tingling. Denies skin break down or rash.        Objective:   Physical Exam BP 128/82  Pulse 66  Resp 18  Ht 5\' 4"  (1.626 m)  Wt 148 lb (67.132 kg)  BMI 25.39 kg/m2  SpO2 100% Pleasant well nourished female, alert and oriented x 3, in no cardio-pulmonary distress. Afebrile. HEENT No facial trauma or asymetry. Sinuses non tender.  EOMI, PERTL, fundoscopic exam  no hemorhage or exudate.  External ears normal, tympanic membranes clear. Oropharynx moist, no exudate, poor  dentition. Neck: supple, no adenopathy,JVD or thyromegaly.No bruits.  Chest: Clear to ascultation bilaterally.No crackles or wheezes. Non tender to palpation  Breast: No asymetry,no masses. No nipple discharge or inversion. No axillary or supraclavicular adenopathy  Cardiovascular system; Heart sounds normal,  S1 and  S2 ,no S3.  No murmur, or thrill. Apical beat not displaced Peripheral pulses  normal.  Abdomen: Soft, non tender, no organomegaly or masses. No bruits. Bowel sounds normal. No guarding, tenderness or rebound.  Rectal:  No mass. Guaiac negative stool.  GU: External genitalia normal. No lesions. Vaginal canal normal.No discharge. Uterus normal size, no adnexal masses, no cervical motion or adnexal tenderness.  Musculoskeletal exam: Full ROM of spine, hips , shoulders and knees. No deformity ,swelling or crepitus noted. No muscle wasting or atrophy.   Neurologic: Cranial nerves 2 to 12 intact. Power, tone ,sensation and reflexes normal throughout. No disturbance in gait. No tremor.  Skin: Intact, no ulceration, erythema , scaling or rash noted. Pigmentation normal throughout  Psych; Tearfula at times , and increased anxiety with tremor at times. Judgement and concentration normal        Assessment & Plan:  Routine general medical examination at a health care facility Annual exam as documented. Counseling done  re healthy lifestyle involving commitment to 150 minutes exercise per week, heart healthy diet, and attaining healthy weight.The importance of adequate sleep also discussed. Regular seat belt use and safe storage  of firearms if patient has them, is also discussed. Changes in health habits are decided on by the patient with goals and time frames  set for achieving them. Immunization and cancer screening needs are specifically addressed at this visit.   Insomnia Sleep hygiene reviewed and written information offered also. Prescription sent for  medication needed.   GAD (generalized anxiety disorder) Counseled and pt to start buspar  NICOTINE ADDICTION Deterroated, current is 15 per day, not willing to set quit date at this time Patient counseled for approximately 5 minutes regarding  the health risks of ongoing nicotine use, specifically all types of cancer, heart disease, stroke and respiratory failure. The options available for help  with cessation ,the behavioral changes to assist the process, and the option to either gradully reduce usage  Or abruptly stop.is also discussed. Pt is also encouraged to set specific goals in number of cigarettes used daily, as well as to set a quit date.   HYPERTENSION Controlled, no change in medication DASH diet and commitment to daily physical activity for a minimum of 30 minutes discussed and encouraged, as a part of hypertension management. The importance of attaining a healthy weight is also discussed.   DYSFUNCTIONAL UTERINE BLEEDING Controlled on hormonal therapy, needs to start weaning off, should be [perimenopausal, will need to see gyne  Medical non-compliance Cancer screening and l;abs remain past due. Pt advised and encouraged to correct ioth as soon as possible

## 2013-05-06 NOTE — Assessment & Plan Note (Signed)
Counseled and pt to start buspar

## 2013-05-06 NOTE — Assessment & Plan Note (Signed)
Sleep hygiene reviewed and written information offered also. Prescription sent for  medication needed.  

## 2013-05-06 NOTE — Assessment & Plan Note (Signed)
Cancer screening and l;abs remain past due. Pt advised and encouraged to correct ioth as soon as possible

## 2013-05-06 NOTE — Assessment & Plan Note (Signed)
Deterroated, current is 15 per day, not willing to set quit date at this time Patient counseled for approximately 5 minutes regarding the health risks of ongoing nicotine use, specifically all types of cancer, heart disease, stroke and respiratory failure. The options available for help with cessation ,the behavioral changes to assist the process, and the option to either gradully reduce usage  Or abruptly stop.is also discussed. Pt is also encouraged to set specific goals in number of cigarettes used daily, as well as to set a quit date.

## 2013-05-06 NOTE — Assessment & Plan Note (Signed)
Controlled on hormonal therapy, needs to start weaning off, should be [perimenopausal, will need to see gyne

## 2013-05-11 LAB — LIPID PANEL
Cholesterol: 184 mg/dL (ref 0–200)
HDL: 51 mg/dL (ref >39)
LDL Cholesterol: 118 mg/dL — ABNORMAL HIGH (ref 0–99)
Total CHOL/HDL Ratio: 3.6 Ratio
Triglycerides: 77 mg/dL (ref <150)
VLDL: 15 mg/dL (ref 0–40)

## 2013-05-11 LAB — BASIC METABOLIC PANEL
BUN: 10 mg/dL (ref 6–23)
CO2: 24 mEq/L (ref 19–32)
Calcium: 9.7 mg/dL (ref 8.4–10.5)
Chloride: 109 mEq/L (ref 96–112)
Creat: 0.9 mg/dL (ref 0.50–1.10)
Glucose, Bld: 85 mg/dL (ref 70–99)
Potassium: 4.8 mEq/L (ref 3.5–5.3)
Sodium: 141 mEq/L (ref 135–145)

## 2013-05-11 LAB — TSH: TSH: 1.105 u[IU]/mL (ref 0.350–4.500)

## 2013-05-12 ENCOUNTER — Other Ambulatory Visit: Payer: Self-pay | Admitting: Family Medicine

## 2013-05-12 LAB — VITAMIN D 25 HYDROXY (VIT D DEFICIENCY, FRACTURES): Vit D, 25-Hydroxy: 16 ng/mL — ABNORMAL LOW (ref 30–89)

## 2013-05-14 ENCOUNTER — Ambulatory Visit (HOSPITAL_COMMUNITY): Payer: BC Managed Care – PPO

## 2013-05-16 ENCOUNTER — Other Ambulatory Visit: Payer: Self-pay

## 2013-05-16 MED ORDER — VITAMIN D (ERGOCALCIFEROL) 1.25 MG (50000 UNIT) PO CAPS: 50000.0000 [IU] | ORAL_CAPSULE | ORAL | Status: DC

## 2013-05-21 ENCOUNTER — Ambulatory Visit (HOSPITAL_COMMUNITY): Payer: BC Managed Care – PPO

## 2013-08-30 ENCOUNTER — Telehealth: Payer: Self-pay | Admitting: Family Medicine

## 2013-08-30 NOTE — Telephone Encounter (Signed)
Pt's niece, Catherine Mclean called in on 07/05 stating Catherine Mclean 's nerves were torn iup since her mother Catherine Mclean had passed around 7 am that morning. Verbal order given to pharmacist for #30 xanax 0.5mg  tabs twice daily as needed, zero refill

## 2013-09-11 ENCOUNTER — Telehealth: Payer: Self-pay | Admitting: Family Medicine

## 2013-09-11 DIAGNOSIS — G4700 Insomnia, unspecified: Secondary | ICD-10-CM

## 2013-09-11 NOTE — Telephone Encounter (Signed)
pls change restoril to 30mg  one at bedtime, now on 15 mg , and let her know, send in 30 with one refill pls

## 2013-09-12 MED ORDER — TEMAZEPAM 30 MG PO CAPS: 30.0000 mg | ORAL_CAPSULE | Freq: Every evening | ORAL | Status: DC | PRN

## 2013-09-12 NOTE — Telephone Encounter (Signed)
Noted.  rx changed and patient notified.

## 2013-09-12 NOTE — Addendum Note (Signed)
Addended by: Denman George B on: 09/12/2013 08:25 AM   Modules accepted: Orders

## 2013-10-31 ENCOUNTER — Ambulatory Visit: Payer: BC Managed Care – PPO | Admitting: Family Medicine

## 2013-11-01 ENCOUNTER — Ambulatory Visit: Payer: BC Managed Care – PPO | Admitting: Family Medicine

## 2013-12-11 ENCOUNTER — Other Ambulatory Visit: Payer: Self-pay | Admitting: Family Medicine

## 2013-12-13 ENCOUNTER — Other Ambulatory Visit: Payer: Self-pay | Admitting: Family Medicine

## 2013-12-24 ENCOUNTER — Encounter: Payer: Self-pay | Admitting: Family Medicine

## 2013-12-24 ENCOUNTER — Encounter (INDEPENDENT_AMBULATORY_CARE_PROVIDER_SITE_OTHER): Payer: Self-pay

## 2013-12-24 ENCOUNTER — Ambulatory Visit (INDEPENDENT_AMBULATORY_CARE_PROVIDER_SITE_OTHER): Payer: BC Managed Care – PPO | Admitting: Family Medicine

## 2013-12-24 VITALS — BP 150/86 | HR 64 | Resp 18 | Ht 63.0 in | Wt 140.1 lb

## 2013-12-24 DIAGNOSIS — F411 Generalized anxiety disorder: Secondary | ICD-10-CM

## 2013-12-24 DIAGNOSIS — F17200 Nicotine dependence, unspecified, uncomplicated: Secondary | ICD-10-CM

## 2013-12-24 DIAGNOSIS — N938 Other specified abnormal uterine and vaginal bleeding: Secondary | ICD-10-CM | POA: Diagnosis not present

## 2013-12-24 DIAGNOSIS — I10 Essential (primary) hypertension: Secondary | ICD-10-CM | POA: Diagnosis not present

## 2013-12-24 DIAGNOSIS — F329 Major depressive disorder, single episode, unspecified: Secondary | ICD-10-CM

## 2013-12-24 DIAGNOSIS — G4700 Insomnia, unspecified: Secondary | ICD-10-CM | POA: Diagnosis not present

## 2013-12-24 DIAGNOSIS — F17210 Nicotine dependence, cigarettes, uncomplicated: Secondary | ICD-10-CM | POA: Diagnosis not present

## 2013-12-24 DIAGNOSIS — F32A Depression, unspecified: Secondary | ICD-10-CM | POA: Insufficient documentation

## 2013-12-24 MED ORDER — ALPRAZOLAM 0.5 MG PO TABS: 0.5000 mg | ORAL_TABLET | Freq: Two times a day (BID) | ORAL | Status: DC

## 2013-12-24 MED ORDER — MIRTAZAPINE 15 MG PO TBDP: 15.0000 mg | ORAL_TABLET | Freq: Every day | ORAL | Status: DC

## 2013-12-24 NOTE — Patient Instructions (Signed)
F/u in 2 month, call if you need me before  You are referred to psychiatry for help with depression  Please cut back on cigarettes  Blood pressure is high today so need to keep follow up  I do hope that you feelm better in the next feqw weeks but i understand it is very difficult for you, you DO need help

## 2013-12-25 NOTE — Assessment & Plan Note (Signed)
Elevated and uncontrolled at this visit Pt excessively tearful and upset, and has been out of medication for several weeks , just resumed DASH diet and commitment to daily physical activity for a minimum of 30 minutes discussed and encouraged, as a part of hypertension management. The importance of attaining a healthy weight is also discussed. No change in med at this visit

## 2013-12-25 NOTE — Assessment & Plan Note (Signed)
Pt to resume xanax as before, states buspar did not work,  I am  unsure as to whether she actually filled the prescription

## 2013-12-25 NOTE — Assessment & Plan Note (Signed)
Severe, prolonged grief due to not unexpected passing of both parents within less than 12 months of each other. Start remeron and psych referral I explained to pt that due symptom severity I strongly recommend treatment by psych, and she agrees to be seen

## 2013-12-25 NOTE — Assessment & Plan Note (Signed)
Controlled with daily megace per gyne, ongoing use of this needs gyne re eval in 2016

## 2013-12-25 NOTE — Progress Notes (Signed)
   Subjective:    Patient ID: Catherine Mclean, female    DOB: [birth year], 53 y.o.   MRN: [id number]  HPI The PT is here for follow up and re-evaluation of chronic medical conditions, medication management and review of any available recent lab and radiology data.  Preventive health is updated, specifically  Cancer screening and Immunization.   Reports that she remains extremely depressed, following the passing of both parents within less than 1 year of each other. Cries daily and often wishes she was gone with them Denies suicidal [plans , but states she gets very angry with people and is generally angry about life and death Has tried grief counseling but not beneficial. States she has lived with both parents her entire life and is just unable to deal with life without them, she does have support from other family members, many of whom esp her sisters continue to grieve excessively also. She states she is tired "of hurting, tired of the pain" she wants help. Has only taken xanax in the past and is willing to add antidepressant and to see psychiatrist Smoking has increased, but not able to consider quitting currently      Review of Systems See HPI Denies recent fever or chills. Denies sinus pressure, nasal congestion, ear pain or sore throat. Denies chest congestion, productive cough or wheezing. Denies chest pains, palpitations and leg swelling Denies abdominal pain, nausea, vomiting,diarrhea or constipation.   Denies dysuria, frequency, hesitancy or incontinence. Denies joint pain, swelling and limitation in mobility. Denies headaches, seizures, numbness, or tingling. . Denies skin break down or rash.        Objective:   Physical Exam BP 150/86 mmHg  Pulse 64  Resp 18  Ht 5\' 3"  (1.6 m)  Wt 140 lb 1.9 oz (63.558 kg)  BMI 24.83 kg/m2  SpO2 98% Patient alert and oriented and in no cardiopulmonary distress.Tearful and depressed appearing  HEENT: No facial asymmetry, EOMI,    oropharynx pink and moist.  Neck supple no JVD, no mass.  Chest: Clear to auscultation bilaterally.DEecreased though adequate air entry  CVS: S1, S2 no murmurs, no S3.Regular rate.  ABD: Soft non tender.   Ext: No edema  MS: Adequate ROM spine, shoulders, hips and knees.  Skin: Intact, no ulcerations or rash noted.  Psych: Poor eye contact, taerful affect. Memory intact  anxious and  depressed appearing.  CNS: CN 2-12 intact, power,  normal throughout.no focal deficits noted.        Assessment & Plan:

## 2013-12-25 NOTE — Assessment & Plan Note (Signed)
Deteriorated, increased smoking due to anxiety and depression, unwilling to comiting to quitting at this time Patient counseled for between 3 to 5 minutes regarding the health risks of ongoing nicotine use, specifically all types of cancer, heart disease, stroke and respiratory failure. The options available for help with cessation ,the behavioral changes to assist the process, and the option to either gradully reduce usage  Or abruptly stop.is also discussed. Pt is also encouraged to set specific goals in number of cigarettes used daily, as well as to set a quit date.

## 2014-01-07 ENCOUNTER — Other Ambulatory Visit: Payer: Self-pay | Admitting: Family Medicine

## 2014-01-10 ENCOUNTER — Other Ambulatory Visit: Payer: Self-pay | Admitting: Family Medicine

## 2014-02-07 ENCOUNTER — Other Ambulatory Visit: Payer: Self-pay | Admitting: Family Medicine

## 2014-02-28 ENCOUNTER — Ambulatory Visit: Payer: BC Managed Care – PPO | Admitting: Family Medicine

## 2014-03-21 ENCOUNTER — Other Ambulatory Visit: Payer: Self-pay | Admitting: Family Medicine

## 2014-04-11 ENCOUNTER — Other Ambulatory Visit: Payer: Self-pay | Admitting: Family Medicine

## 2014-05-09 ENCOUNTER — Other Ambulatory Visit: Payer: Self-pay | Admitting: Family Medicine

## 2014-06-04 ENCOUNTER — Other Ambulatory Visit: Payer: Self-pay | Admitting: Family Medicine

## 2014-06-11 ENCOUNTER — Encounter: Payer: Self-pay | Admitting: Family Medicine

## 2014-06-11 ENCOUNTER — Ambulatory Visit (INDEPENDENT_AMBULATORY_CARE_PROVIDER_SITE_OTHER): Payer: BLUE CROSS/BLUE SHIELD | Admitting: Family Medicine

## 2014-06-11 ENCOUNTER — Other Ambulatory Visit (HOSPITAL_COMMUNITY)
Admission: RE | Admit: 2014-06-11 | Discharge: 2014-06-11 | Disposition: A | Discharge status: Home / Self Care | Payer: BLUE CROSS/BLUE SHIELD | Source: Ambulatory Visit | Attending: Family Medicine | Admitting: Family Medicine

## 2014-06-11 VITALS — BP 138/82 | HR 68 | Resp 18 | Ht 63.0 in | Wt 168.0 lb

## 2014-06-11 DIAGNOSIS — E785 Hyperlipidemia, unspecified: Secondary | ICD-10-CM

## 2014-06-11 DIAGNOSIS — Z0000 Encounter for general adult medical examination without abnormal findings: Secondary | ICD-10-CM

## 2014-06-11 DIAGNOSIS — R7302 Impaired glucose tolerance (oral): Secondary | ICD-10-CM

## 2014-06-11 DIAGNOSIS — I10 Essential (primary) hypertension: Secondary | ICD-10-CM

## 2014-06-11 DIAGNOSIS — G4700 Insomnia, unspecified: Secondary | ICD-10-CM

## 2014-06-11 DIAGNOSIS — Z862 Personal history of diseases of the blood and blood-forming organs and certain disorders involving the immune mechanism: Secondary | ICD-10-CM

## 2014-06-11 DIAGNOSIS — Z1211 Encounter for screening for malignant neoplasm of colon: Secondary | ICD-10-CM

## 2014-06-11 DIAGNOSIS — Z9119 Patient's noncompliance with other medical treatment and regimen: Secondary | ICD-10-CM

## 2014-06-11 DIAGNOSIS — Z01419 Encounter for gynecological examination (general) (routine) without abnormal findings: Secondary | ICD-10-CM | POA: Insufficient documentation

## 2014-06-11 DIAGNOSIS — Z720 Tobacco use: Secondary | ICD-10-CM

## 2014-06-11 DIAGNOSIS — Z1321 Encounter for screening for nutritional disorder: Secondary | ICD-10-CM

## 2014-06-11 DIAGNOSIS — Z124 Encounter for screening for malignant neoplasm of cervix: Secondary | ICD-10-CM

## 2014-06-11 DIAGNOSIS — N939 Abnormal uterine and vaginal bleeding, unspecified: Secondary | ICD-10-CM

## 2014-06-11 DIAGNOSIS — Z91199 Patient's noncompliance with other medical treatment and regimen due to unspecified reason: Secondary | ICD-10-CM

## 2014-06-11 DIAGNOSIS — F17200 Nicotine dependence, unspecified, uncomplicated: Secondary | ICD-10-CM

## 2014-06-11 LAB — CBC
HCT: 42.1 % (ref 36.0–46.0)
Hemoglobin: 13.9 g/dL (ref 12.0–15.0)
MCH: 28.3 pg (ref 26.0–34.0)
MCHC: 33 g/dL (ref 30.0–36.0)
MCV: 85.6 fL (ref 78.0–100.0)
MPV: 9.9 fL (ref 8.6–12.4)
Platelets: 254 10*3/uL (ref 150–400)
RBC: 4.92 MIL/uL (ref 3.87–5.11)
RDW: 13.9 % (ref 11.5–15.5)
WBC: 8.8 10*3/uL (ref 4.0–10.5)

## 2014-06-11 LAB — POC HEMOCCULT BLD/STL (OFFICE/1-CARD/DIAGNOSTIC): Fecal Occult Blood, POC: NEGATIVE

## 2014-06-11 MED ORDER — ZOLPIDEM TARTRATE 10 MG PO TABS: 10.0000 mg | ORAL_TABLET | Freq: Every evening | ORAL | Status: DC | PRN

## 2014-06-11 NOTE — Patient Instructions (Addendum)
F/u in 4 month, call if you need me before  STOP restoril for sleep since not working  New is zolpidem 10mg  one at bedtime  Xanax is limited to one twice daily  Pls turn OFF all sound and light when you are sleepiong or else you will never get the rest you need   Change eating as discussed and eat smaller portions, start walking 30 mins every day, hope is that you lose 10 pounds in the next 4 months  You are referred to Dr  Elonda Husky re abnormal spotting and to manage your megace, important that you do go  PLEASE schedule and get mammogram after May as you have promised to do.  PLEASE CUT BACK ON CIGARETTES with a view to QUITTING, this is something that you NEED to do  Labs today please

## 2014-06-11 NOTE — Progress Notes (Signed)
Subjective:    Patient ID: Catherine Mclean, female    DOB: [birth year], 54 y.o.   MRN: [id number]  HPI Patient is in for annual physical exam. C/o very poor sleep despite restoril , and remron has caused excesssive weigt gain so will d/c this Depression and grief and anger over her parent's passing is ;lessening though states she cries every day. Currently smoking and has no plan to quit as yet Noted vaginal spotting for the first time in over 1 year, has been on megace for years due to excessive menses, needs to have gyne re eval   Review of Systems See HPI     Objective:   Physical Exam BP 138/82 mmHg  Pulse 68  Resp 18  Ht 5\' 3"  (1.6 m)  Wt 168 lb 0.6 oz (76.222 kg)  BMI 29.77 kg/m2  SpO2 98%   Pleasant well nourished female, alert and oriented x 3, in no cardio-pulmonary distress. Afebrile. HEENT No facial trauma or asymetry. Sinuses non tender.  Extra occullar muscles intact, pupils equally reactive to light. External ears normal, tympanic membranes clear. Oropharynx moist, no exudate, fair dentition. Neck: supple, no adenopathy,JVD or thyromegaly.No bruits.  Chest: Clear to ascultation bilaterally.No crackles or wheezes. Non tender to palpation  Breast: No asymetry,no masses or lumps. No tenderness. No nipple discharge or inversion. No axillary or supraclavicular adenopathy  Cardiovascular system; Heart sounds normal,  S1 and  S2 ,no S3.  No murmur, or thrill. Apical beat not displaced Peripheral pulses normal.  Abdomen: Soft, non tender, no organomegaly or masses. No bruits. Bowel sounds normal. No guarding, tenderness or rebound.  Rectal:  Normal sphincter tone. No mass.No rectal masses.  Guaiac negative stool.  GU: External genitalia normal female genitalia , female distribution of hair. No lesions. Urethral meatus normal in size, no  Prolapse, no lesions visibly  Present. Bladder non tender. Vagina pink and moist , with no visible lesions ,  physiologic discharge present . Adequate pelvic support no  cystocele or rectocele noted Cervix pink and appears healthy, no lesions or ulcerations noted, no discharge noted from os Uterus normal size, no adnexal masses, no cervical motion or adnexal tenderness.   Musculoskeletal exam: Full ROM of spine, hips , shoulders and knees. No deformity ,swelling or crepitus noted. No muscle wasting or atrophy.   Neurologic: Cranial nerves 2 to 12 intact. Power, tone ,sensation and reflexes normal throughout. No disturbance in gait. No tremor.  Skin: Intact, no ulceration, erythema , scaling or rash noted. Pigmentation normal throughout  Psych; Normal mood and affect. Judgement and concentration normal. Slightly tearful when discussing parents but not as severe       Assessment & Plan:  Annual physical exam Annual exam as documented. Counseling done  re healthy lifestyle involving commitment to 150 minutes exercise per week, heart healthy diet, and attaining healthy weight.The importance of adequate sleep also discussed. Regular seat belt use and home safety, is also discussed. Changes in health habits are decided on by the patient with goals and time frames  set for achieving them. Immunization and cancer screening needs are specifically addressed at this visit.    NICOTINE ADDICTION Patient counseled for approximately 5 minutes regarding the health risks of ongoing nicotine use, specifically all types of cancer, heart disease, stroke and respiratory failure. The options available for help with cessation ,the behavioral changes to assist the process, and the option to either gradully reduce usage  Or abruptly stop.is also discussed. Pt is also encouraged  to set specific goals in number of cigarettes used daily, as well as to set a quit date.  Number of cigarettes/cigars currently smoking daily: 15    Insomnia Inadequately treated despite 2 medications for help with sleep and  anxiolytic Change to zolpidem Sleep hygiene reviewed Would benefit for therapy for prolonged grief, depressiin and anxiety , but no interest   Abnormal uterine bleeding unrelated to menstrual cycle Reports spotting this past week, first episode in over 1 year. Has been on megace for over 3 years for heavy bleeding Pt now 1 , needs gyne to re evaluate endometrium and address ongoing need for megace , will refer, she understands the need to go   Medical non-compliance Electing to delay mammogram until after May, and not willing to set colonoscopy date at this time, but will continue to work wit and encourage her

## 2014-06-12 ENCOUNTER — Other Ambulatory Visit: Payer: Self-pay | Admitting: Family Medicine

## 2014-06-12 DIAGNOSIS — Z0000 Encounter for general adult medical examination without abnormal findings: Secondary | ICD-10-CM | POA: Insufficient documentation

## 2014-06-12 DIAGNOSIS — N939 Abnormal uterine and vaginal bleeding, unspecified: Secondary | ICD-10-CM | POA: Insufficient documentation

## 2014-06-12 LAB — LIPID PANEL
Cholesterol: 210 mg/dL — ABNORMAL HIGH (ref 0–200)
HDL: 57 mg/dL (ref >=46)
LDL Cholesterol: 127 mg/dL — ABNORMAL HIGH (ref 0–99)
Total CHOL/HDL Ratio: 3.7 Ratio
Triglycerides: 132 mg/dL (ref <150)
VLDL: 26 mg/dL (ref 0–40)

## 2014-06-12 LAB — HEMOGLOBIN A1C
Hgb A1c MFr Bld: 5.5 % (ref <5.7)
Mean Plasma Glucose: 111 mg/dL (ref <117)

## 2014-06-12 LAB — COMPREHENSIVE METABOLIC PANEL
ALT: 16 U/L (ref 0–35)
AST: 34 U/L (ref 0–37)
Albumin: 4.3 g/dL (ref 3.5–5.2)
Alkaline Phosphatase: 91 U/L (ref 39–117)
BUN: 11 mg/dL (ref 6–23)
CO2: 22 mEq/L (ref 19–32)
Calcium: 9.9 mg/dL (ref 8.4–10.5)
Chloride: 107 mEq/L (ref 96–112)
Creat: 0.81 mg/dL (ref 0.50–1.10)
Glucose, Bld: 93 mg/dL (ref 70–99)
Potassium: 4.4 mEq/L (ref 3.5–5.3)
Sodium: 141 mEq/L (ref 135–145)
Total Bilirubin: 0.3 mg/dL (ref 0.2–1.2)
Total Protein: 7.4 g/dL (ref 6.0–8.3)

## 2014-06-12 LAB — VITAMIN D 25 HYDROXY (VIT D DEFICIENCY, FRACTURES): Vit D, 25-Hydroxy: 12 ng/mL — ABNORMAL LOW (ref 30–100)

## 2014-06-12 LAB — TSH: TSH: 1.435 u[IU]/mL (ref 0.350–4.500)

## 2014-06-12 LAB — FOLLICLE STIMULATING HORMONE: FSH: 18.7 m[IU]/mL

## 2014-06-12 LAB — LUTEINIZING HORMONE: LH: 8 m[IU]/mL

## 2014-06-12 NOTE — Assessment & Plan Note (Signed)
Inadequately treated despite 2 medications for help with sleep and anxiolytic Change to zolpidem Sleep hygiene reviewed Would benefit for therapy for prolonged grief, depressiin and anxiety , but no interest

## 2014-06-12 NOTE — Assessment & Plan Note (Signed)
Electing to delay mammogram until after May, and not willing to set colonoscopy date at this time, but will continue to work wit and encourage her

## 2014-06-12 NOTE — Assessment & Plan Note (Signed)
Reports spotting this past week, first episode in over 1 year. Has been on megace for over 3 years for heavy bleeding Pt now 48 , needs gyne to re evaluate endometrium and address ongoing need for megace , will refer, she understands the need to go

## 2014-06-12 NOTE — Assessment & Plan Note (Signed)

## 2014-06-12 NOTE — Assessment & Plan Note (Signed)

## 2014-06-13 LAB — CYTOLOGY - PAP

## 2014-06-21 ENCOUNTER — Other Ambulatory Visit: Payer: Self-pay

## 2014-06-21 MED ORDER — VITAMIN D (ERGOCALCIFEROL) 1.25 MG (50000 UNIT) PO CAPS: 50000.0000 [IU] | ORAL_CAPSULE | ORAL | Status: DC

## 2014-06-24 ENCOUNTER — Encounter: Payer: Self-pay | Admitting: Obstetrics & Gynecology

## 2014-07-01 ENCOUNTER — Telehealth: Payer: Self-pay

## 2014-07-01 NOTE — Telephone Encounter (Signed)
Attempted to call patient back to clarify what med she needs refilled.  No answer received.  Will try again

## 2014-07-02 ENCOUNTER — Telehealth: Payer: Self-pay | Admitting: *Deleted

## 2014-07-02 NOTE — Telephone Encounter (Signed)
See previous telephone message  

## 2014-07-02 NOTE — Telephone Encounter (Signed)
Patient is having cramping all over.   Is taking otc potassium with no relief.  Last potassium level was normal.  Please advise.

## 2014-07-02 NOTE — Telephone Encounter (Signed)
Pt called requesting to speak with Loma Sousa. Please advise [phone]

## 2014-07-02 NOTE — Telephone Encounter (Signed)
No new med was started at last visit. Ex[plain her  Vit D i9s very low resend the script or advise once daily vit D3 800 IU, thay may help as labs otherwise good

## 2014-07-02 NOTE — Telephone Encounter (Signed)
Patient aware.

## 2014-07-04 ENCOUNTER — Other Ambulatory Visit: Payer: Self-pay | Admitting: Family Medicine

## 2014-08-06 ENCOUNTER — Other Ambulatory Visit: Payer: Self-pay | Admitting: Family Medicine

## 2014-08-08 ENCOUNTER — Other Ambulatory Visit: Payer: Self-pay | Admitting: Family Medicine

## 2014-09-03 ENCOUNTER — Ambulatory Visit (INDEPENDENT_AMBULATORY_CARE_PROVIDER_SITE_OTHER): Payer: BLUE CROSS/BLUE SHIELD | Admitting: Family Medicine

## 2014-09-03 ENCOUNTER — Encounter: Payer: Self-pay | Admitting: Family Medicine

## 2014-09-03 VITALS — BP 126/80 | HR 80 | Resp 18 | Ht 63.0 in | Wt 165.1 lb

## 2014-09-03 DIAGNOSIS — G4700 Insomnia, unspecified: Secondary | ICD-10-CM

## 2014-09-03 DIAGNOSIS — Z720 Tobacco use: Secondary | ICD-10-CM

## 2014-09-03 DIAGNOSIS — I10 Essential (primary) hypertension: Secondary | ICD-10-CM

## 2014-09-03 DIAGNOSIS — Z114 Encounter for screening for human immunodeficiency virus [HIV]: Secondary | ICD-10-CM

## 2014-09-03 DIAGNOSIS — F32A Depression, unspecified: Secondary | ICD-10-CM

## 2014-09-03 DIAGNOSIS — N939 Abnormal uterine and vaginal bleeding, unspecified: Secondary | ICD-10-CM

## 2014-09-03 DIAGNOSIS — E559 Vitamin D deficiency, unspecified: Secondary | ICD-10-CM

## 2014-09-03 DIAGNOSIS — F17200 Nicotine dependence, unspecified, uncomplicated: Secondary | ICD-10-CM

## 2014-09-03 DIAGNOSIS — M62838 Other muscle spasm: Secondary | ICD-10-CM | POA: Diagnosis not present

## 2014-09-03 DIAGNOSIS — F329 Major depressive disorder, single episode, unspecified: Secondary | ICD-10-CM

## 2014-09-03 DIAGNOSIS — F411 Generalized anxiety disorder: Secondary | ICD-10-CM

## 2014-09-03 MED ORDER — CYCLOBENZAPRINE HCL 10 MG PO TABS: 10.0000 mg | ORAL_TABLET | Freq: Three times a day (TID) | ORAL | Status: DC | PRN

## 2014-09-03 NOTE — Patient Instructions (Addendum)
F/u as  Before  Labs today HIV,  chem 7, magnesium, calcium, Vit D  Muscle relaxant prescribed for 3 times daily, if this makes you too sleepy reduce the dose and take art night  Break megace in half , take half daily for 1 month, then half every other day for nex month, goal is to quit soon  It is important that you exercise regularly at least 30 minutes 7 times a week. If you develop chest pain, have severe difficulty breathing, or feel very tired, stop exercising immediately and seek medical attention   Continue to cut back on cigarettes you need to quit!  Thanks for choosing [facility], we consider it a privelige to serve you.

## 2014-09-03 NOTE — Assessment & Plan Note (Signed)

## 2014-09-04 LAB — BASIC METABOLIC PANEL
BUN: 8 mg/dL (ref 6–23)
CO2: 23 mEq/L (ref 19–32)
Calcium: 10 mg/dL (ref 8.4–10.5)
Chloride: 102 mEq/L (ref 96–112)
Creat: 0.94 mg/dL (ref 0.50–1.10)
Glucose, Bld: 93 mg/dL (ref 70–99)
Potassium: 3.8 mEq/L (ref 3.5–5.3)
Sodium: 137 mEq/L (ref 135–145)

## 2014-09-04 LAB — MAGNESIUM: Magnesium: 2.3 mg/dL (ref 1.5–2.5)

## 2014-09-04 LAB — CALCIUM: Calcium: 10 mg/dL (ref 8.4–10.5)

## 2014-09-04 NOTE — Assessment & Plan Note (Signed)
Controlled, no change in medication  

## 2014-09-04 NOTE — Assessment & Plan Note (Signed)
Improved and controlled, no change in management

## 2014-09-04 NOTE — Assessment & Plan Note (Signed)
1 week h/o severe spasm, basic lab data to be obtained, and muscle relaxant prescribed

## 2014-09-04 NOTE — Assessment & Plan Note (Signed)
Controlled with megace 40 mg daily formover 3 years, discussed tapering off megace to 20 mg daily , then alt day withj a plan to quit in 2 months

## 2014-09-04 NOTE — Assessment & Plan Note (Signed)
Sleep hygiene reviewed and written information offered also. Prescription sent for  medication needed.  

## 2014-09-04 NOTE — Progress Notes (Signed)
Subjective:    Patient ID: Catherine Mclean, female    DOB: 1960/07/17, 54 y.o.   MRN: [id number]  HPI 1 week h/o painful muscle spasms, affecting different muscle groups, pain is as much as an 8 at its  most severe, now right upper arm affected, has had spasms in the past but not as severe nor for as long a period Has started walking and has lost a few pounds. Willing to reduce megace dose, as likely contributing to weight gain, and  Also she should be at least perimenopausal , states has been having hot flashes in recent times Smokes 10 per day, has cut back , wants to continue  To cut back, but unwilling to set a quit date currently   Review of Systems See HPI Denies recent fever or chills. Denies sinus pressure, nasal congestion, ear pain or sore throat. Denies chest congestion, productive cough or wheezing. Denies chest pains, palpitations and leg swelling Denies abdominal pain, nausea, vomiting,diarrhea or constipation.   Denies dysuria, frequency, hesitancy or incontinence. Denies joint pain, swelling and limitation in mobility. Denies headaches, seizures, numbness, or tingling. Denies uncontrolled depression, anxiety or insomnia. Denies skin break down or rash.        Objective:   Physical Exam  BP 126/80 mmHg  Pulse 80  Resp 18  Ht 5\' 3"  (1.6 m)  Wt 165 lb 1.3 oz (74.88 kg)  BMI 29.25 kg/m2  SpO2 93% Patient alert and oriented and in no cardiopulmonary distress.Pt in pain  HEENT: No facial asymmetry, EOMI,   oropharynx pink and moist.  Neck decreased ROM with right trapezius spasm, no JVD, no mass.  Chest: Clear to auscultation bilaterally.  CVS: S1, S2 no murmurs, no S3.Regular rate.  ABD: Soft non tender.   Ext: No edema  MS: Adequate ROM spine, shoulders, hips and knees.spasm of right biceps  Skin: Intact, no ulcerations or rash noted.  Psych: Good eye contact, normal affect. Memory intact not anxious or depressed appearing.  CNS: CN 2-12 intact,  power,  normal throughout.no focal deficits noted.       Assessment & Plan:  NICOTINE ADDICTION Patient counseled for approximately 5 minutes regarding the health risks of ongoing nicotine use, specifically all types of cancer, heart disease, stroke and respiratory failure. The options available for help with cessation ,the behavioral changes to assist the process, and the option to either gradully reduce usage  Or abruptly stop.is also discussed. Pt is also encouraged to set specific goals in number of cigarettes used daily, as well as to set a quit date.  Number of cigarettes/cigars currently smoking daily: 10   Spasm of muscle 1 week h/o severe spasm, basic lab data to be obtained, and muscle relaxant prescribed  Abnormal uterine bleeding unrelated to menstrual cycle Controlled with megace 40 mg daily formover 3 years, discussed tapering off megace to 20 mg daily , then alt day withj a plan to quit in 2 months  Essential hypertension Controlled, no change in medication DASH diet and commitment to daily physical activity for a minimum of 30 minutes discussed and encouraged, as a part of hypertension management. The importance of attaining a healthy weight is also discussed.  BP/Weight 09/03/2014 06/11/2014 12/24/2013 05/03/2013 08/26/2012 06/30/2012 6/38/7564  Systolic BP 332 951 884 166 063 016 010  Diastolic BP 80 82 86 82 88 78 74  Wt. (Lbs) 165.08 168.04 140.12 148 143 143.04 144.08  BMI 29.25 29.77 24.83 25.39 26.15 24.54 24.72  GAD (generalized anxiety disorder) Controlled, no change in medication   Depression Improved and controlled, no change in management  Insomnia Sleep hygiene reviewed and written information offered also. Prescription sent for  medication needed.

## 2014-09-04 NOTE — Assessment & Plan Note (Signed)
Controlled, no change in medication DASH diet and commitment to daily physical activity for a minimum of 30 minutes discussed and encouraged, as a part of hypertension management. The importance of attaining a healthy weight is also discussed.  BP/Weight 09/03/2014 06/11/2014 12/24/2013 05/03/2013 08/26/2012 06/30/2012 3/79/4327  Systolic BP 614 709 295 747 340 370 964  Diastolic BP 80 82 86 82 88 78 74  Wt. (Lbs) 165.08 168.04 140.12 148 143 143.04 144.08  BMI 29.25 29.77 24.83 25.39 26.15 24.54 24.72

## 2014-09-05 LAB — HIV ANTIBODY (ROUTINE TESTING W REFLEX): HIV 1&2 Ab, 4th Generation: NONREACTIVE

## 2014-09-05 LAB — VITAMIN D 25 HYDROXY (VIT D DEFICIENCY, FRACTURES): Vit D, 25-Hydroxy: 58 ng/mL (ref 30–100)

## 2014-09-25 ENCOUNTER — Other Ambulatory Visit: Payer: Self-pay | Admitting: Family Medicine

## 2014-10-15 ENCOUNTER — Ambulatory Visit: Payer: BLUE CROSS/BLUE SHIELD | Admitting: Family Medicine

## 2014-10-15 ENCOUNTER — Encounter: Payer: Self-pay | Admitting: *Deleted

## 2014-11-07 ENCOUNTER — Other Ambulatory Visit: Payer: Self-pay | Admitting: Family Medicine

## 2014-11-11 ENCOUNTER — Ambulatory Visit (INDEPENDENT_AMBULATORY_CARE_PROVIDER_SITE_OTHER): Payer: BLUE CROSS/BLUE SHIELD

## 2014-11-11 DIAGNOSIS — Z23 Encounter for immunization: Secondary | ICD-10-CM

## 2014-12-06 ENCOUNTER — Other Ambulatory Visit: Payer: Self-pay | Admitting: Family Medicine

## 2014-12-12 ENCOUNTER — Other Ambulatory Visit: Payer: Self-pay | Admitting: Family Medicine

## 2014-12-31 ENCOUNTER — Other Ambulatory Visit: Payer: Self-pay | Admitting: Family Medicine

## 2015-01-07 ENCOUNTER — Other Ambulatory Visit: Payer: Self-pay | Admitting: Family Medicine

## 2015-01-08 ENCOUNTER — Telehealth: Payer: Self-pay | Admitting: Family Medicine

## 2015-01-08 DIAGNOSIS — M65311 Trigger thumb, right thumb: Secondary | ICD-10-CM

## 2015-01-08 MED ORDER — IBUPROFEN 800 MG PO TABS: 800.0000 mg | ORAL_TABLET | Freq: Two times a day (BID) | ORAL | Status: DC

## 2015-01-08 MED ORDER — PREDNISONE 5 MG PO TABS: 5.0000 mg | ORAL_TABLET | Freq: Two times a day (BID) | ORAL | Status: DC

## 2015-01-08 NOTE — Telephone Encounter (Signed)
This is trigger thumb, needs ortho refer to soonest available/ ortho of her choice. Pred 5mg  twice daily fopr 5 days and ibuprofen 800 mg twice daily for 5 days will also help so can send in also esp if she has  A wait on ortho

## 2015-01-08 NOTE — Addendum Note (Signed)
Addended by: Denman George B on: 01/08/2015 03:11 PM   Modules accepted: Orders

## 2015-01-08 NOTE — Telephone Encounter (Signed)
Right hand swollen, when she bends her thumb it will lock down and she will need help unlocking it, very painful when it locks, please advise?

## 2015-01-08 NOTE — Telephone Encounter (Signed)
Please advise 

## 2015-01-08 NOTE — Addendum Note (Signed)
Addended by: Denman George B on: 01/08/2015 03:07 PM   Modules accepted: Orders

## 2015-01-08 NOTE — Telephone Encounter (Signed)
Patient states that she does not have a preference just does not want to see Dr. Luna Glasgow.   Referral entered and medications sent to pharmacy.

## 2015-02-12 ENCOUNTER — Other Ambulatory Visit: Payer: Self-pay | Admitting: Family Medicine

## 2015-02-18 ENCOUNTER — Other Ambulatory Visit: Payer: Self-pay | Admitting: Family Medicine

## 2015-02-27 ENCOUNTER — Ambulatory Visit: Payer: BLUE CROSS/BLUE SHIELD | Admitting: Family Medicine

## 2015-03-04 ENCOUNTER — Ambulatory Visit: Payer: BLUE CROSS/BLUE SHIELD | Admitting: Family Medicine

## 2015-03-11 ENCOUNTER — Other Ambulatory Visit: Payer: Self-pay | Admitting: Family Medicine

## 2015-03-17 ENCOUNTER — Other Ambulatory Visit: Payer: Self-pay | Admitting: Family Medicine

## 2015-04-10 ENCOUNTER — Other Ambulatory Visit: Payer: Self-pay

## 2015-04-10 MED ORDER — BISOPROLOL-HYDROCHLOROTHIAZIDE 5-6.25 MG PO TABS: 1.0000 | ORAL_TABLET | Freq: Every day | ORAL | Status: DC

## 2015-04-10 MED ORDER — VITAMIN D (ERGOCALCIFEROL) 1.25 MG (50000 UNIT) PO CAPS: 50000.0000 [IU] | ORAL_CAPSULE | ORAL | Status: DC

## 2015-04-17 ENCOUNTER — Ambulatory Visit (INDEPENDENT_AMBULATORY_CARE_PROVIDER_SITE_OTHER): Payer: BLUE CROSS/BLUE SHIELD | Admitting: Family Medicine

## 2015-04-17 ENCOUNTER — Encounter: Payer: Self-pay | Admitting: Family Medicine

## 2015-04-17 VITALS — BP 128/78 | HR 60 | Resp 18 | Ht 63.0 in | Wt 166.0 lb

## 2015-04-17 DIAGNOSIS — F17200 Nicotine dependence, unspecified, uncomplicated: Secondary | ICD-10-CM | POA: Diagnosis not present

## 2015-04-17 DIAGNOSIS — E785 Hyperlipidemia, unspecified: Secondary | ICD-10-CM

## 2015-04-17 DIAGNOSIS — Z862 Personal history of diseases of the blood and blood-forming organs and certain disorders involving the immune mechanism: Secondary | ICD-10-CM

## 2015-04-17 DIAGNOSIS — Z1159 Encounter for screening for other viral diseases: Secondary | ICD-10-CM

## 2015-04-17 DIAGNOSIS — Z91199 Patient's noncompliance with other medical treatment and regimen due to unspecified reason: Secondary | ICD-10-CM

## 2015-04-17 DIAGNOSIS — F32A Depression, unspecified: Secondary | ICD-10-CM

## 2015-04-17 DIAGNOSIS — I10 Essential (primary) hypertension: Secondary | ICD-10-CM

## 2015-04-17 DIAGNOSIS — E559 Vitamin D deficiency, unspecified: Secondary | ICD-10-CM

## 2015-04-17 DIAGNOSIS — F329 Major depressive disorder, single episode, unspecified: Secondary | ICD-10-CM

## 2015-04-17 DIAGNOSIS — G4700 Insomnia, unspecified: Secondary | ICD-10-CM

## 2015-04-17 DIAGNOSIS — Z9119 Patient's noncompliance with other medical treatment and regimen: Secondary | ICD-10-CM

## 2015-04-17 DIAGNOSIS — R7302 Impaired glucose tolerance (oral): Secondary | ICD-10-CM

## 2015-04-17 DIAGNOSIS — M62838 Other muscle spasm: Secondary | ICD-10-CM

## 2015-04-17 DIAGNOSIS — F411 Generalized anxiety disorder: Secondary | ICD-10-CM

## 2015-04-17 MED ORDER — ALPRAZOLAM 0.5 MG PO TABS: ORAL_TABLET | ORAL | Status: DC

## 2015-04-17 MED ORDER — TRAZODONE HCL 50 MG PO TABS: 50.0000 mg | ORAL_TABLET | Freq: Every day | ORAL | Status: DC

## 2015-04-17 NOTE — Patient Instructions (Addendum)
F/u first week in April, call if you need me sooner  New for depression trazodone at bedtime, and xanax increased to 3 times daily, take 8 hours apart  NEED blood work to find out about muscle spasms , pls get this done !  Thanks for choosing [facility], we consider it a privelige to serve you.  Work  on cutting back on cigarettes

## 2015-04-18 ENCOUNTER — Encounter: Payer: Self-pay | Admitting: Family Medicine

## 2015-04-18 ENCOUNTER — Telehealth: Payer: Self-pay | Admitting: Family Medicine

## 2015-04-18 NOTE — Telephone Encounter (Signed)
Concern addressed

## 2015-04-18 NOTE — Telephone Encounter (Signed)
Carabella is asking for a returned call from [place] she has a questions

## 2015-04-19 NOTE — Assessment & Plan Note (Signed)
Sleep hygiene reviewed and written information offered also. Prescription sent for  medication needed.  

## 2015-04-19 NOTE — Progress Notes (Signed)
Subjective:    Patient ID: Catherine Mclean, female    DOB: [birth year], 55 y.o.   MRN: [id number]  HPI   Catherine Mclean     MRN: [id number]      DOB: 08-Oct-1960   HPI Catherine Mclean is here for follow up and re-evaluation of chronic medical conditions, medication management and review of any available recent lab and radiology data.  Preventive health is updated, specifically  Cancer screening and Immunization. Labs and screening tests are past due, still electing to delay screening, "feels overwhelmed"  The PT denies any adverse reactions to current medications since the last visit.  C/o increased and uncontrolled anxiety and depression with insomnia and fatigue, not suicidal or homicidal, need help   ROS Denies recent fever or chills. Denies sinus pressure, nasal congestion, ear pain or sore throat. Denies chest congestion, productive cough or wheezing. Denies chest pains, palpitations and leg swelling Denies abdominal pain, nausea, vomiting,diarrhea or constipation.   Denies dysuria, frequency, hesitancy or incontinence. Denies joint pain, swelling and limitation in mobility. Denies headaches, seizures, numbness, or tingling.  Denies skin break down or rash.   PE  BP 128/78 mmHg  Pulse 60  Resp 18  Ht 5\' 3"  (1.6 m)  Wt 166 lb (75.297 kg)  BMI 29.41 kg/m2  SpO2 99%  Patient alert and oriented and in no cardiopulmonary distress.  HEENT: No facial asymmetry, EOMI,   oropharynx pink and moist.  Neck supple no JVD, no mass.  Chest: Clear to auscultation bilaterally.  CVS: S1, S2 no murmurs, no S3.Regular rate.  ABD: Soft non tender.   Ext: No edema  MS: Adequate ROM spine, shoulders, hips and knees.Palpable spasm in right arm  Skin: Intact, no ulcerations or rash noted.  Psych: Good eye contact, tearful, anxious, crying openly at times, good eye contact, not suicidal or homicidal  CNS: CN 2-12 intact, power,  normal throughout.no focal deficits noted.   Assessment  & Plan   Depression Start bedtime trazodone and increase xanax, short term Pt encouraged to commit to daily exercise and to continue to work on spiritual growth and strength Therapy would be beneficial, but not interested at this time  Essential hypertension Controlled, no change in medication DASH diet and commitment to daily physical activity for a minimum of 30 minutes discussed and encouraged, as a part of hypertension management. The importance of attaining a healthy weight is also discussed.  BP/Weight 04/17/2015 09/03/2014 06/11/2014 12/24/2013 05/03/2013 [id number] 0000000  Systolic BP 0000000 123XX123 0000000 [id number] 0000000 0000000 [id number]  Diastolic BP 78 80 82 86 82 88 78  Wt. (Lbs) 166 165.08 168.04 140.12 148 143 143.04  BMI 29.41 29.25 29.77 24.83 25.39 26.15 24.54        GAD (generalized anxiety disorder) Deteriorated due to increased family stress, increase xanax dose temporarily  Hyperlipemia Hyperlipidemia:Low fat diet discussed and encouraged.   Lipid Panel  Lab Results  Component Value Date   CHOL 210* 06/11/2014   HDL 57 06/11/2014   LDLCALC 127* 06/11/2014   TRIG 132 06/11/2014   CHOLHDL 3.7 06/11/2014   Updated lab needed      Insomnia Sleep hygiene reviewed and written information offered also. Prescription sent for  medication needed.   NICOTINE ADDICTION Patient counseled for approximately 5 minutes regarding the health risks of ongoing nicotine use, specifically all types of cancer, heart disease, stroke and respiratory failure. The options available for help with cessation ,the behavioral changes to assist the process, and the  option to either gradully reduce usage  Or abruptly stop.is also discussed. Pt is also encouraged to set specific goals in number of cigarettes used daily, as well as to set a quit date.  Number of cigarettes/cigars currently smoking daily: 20   Spasm of muscle Generalized intermittent muscle spasm, needs muscle enzymes tested and labs , has  been ordered for over 6 months, explained how vital this is  Medical non-compliance Refusing cancer screening tests once more, states overwhelmed mentally , states will do in the future       Review of Systems     Objective:   Physical Exam        Assessment & Plan:

## 2015-04-19 NOTE — Assessment & Plan Note (Signed)

## 2015-04-19 NOTE — Assessment & Plan Note (Signed)
Hyperlipidemia:Low fat diet discussed and encouraged.   Lipid Panel  Lab Results  Component Value Date   CHOL 210* 06/11/2014   HDL 57 06/11/2014   LDLCALC 127* 06/11/2014   TRIG 132 06/11/2014   CHOLHDL 3.7 06/11/2014   Updated lab needed

## 2015-04-19 NOTE — Assessment & Plan Note (Addendum)
Start bedtime trazodone and increase xanax, short term Pt encouraged to commit to daily exercise and to continue to work on spiritual growth and strength Therapy would be beneficial, but not interested at this time

## 2015-04-19 NOTE — Assessment & Plan Note (Signed)
Generalized intermittent muscle spasm, needs muscle enzymes tested and labs , has been ordered for over 6 months, explained how vital this is

## 2015-04-19 NOTE — Assessment & Plan Note (Signed)
Controlled, no change in medication DASH diet and commitment to daily physical activity for a minimum of 30 minutes discussed and encouraged, as a part of hypertension management. The importance of attaining a healthy weight is also discussed.  BP/Weight 04/17/2015 09/03/2014 06/11/2014 12/24/2013 05/03/2013 [id number] 0000000  Systolic BP 0000000 123XX123 0000000 Q000111Q 0000000 0000000 123456  Diastolic BP 78 80 82 86 82 88 78  Wt. (Lbs) 166 165.08 168.04 140.12 148 143 143.04  BMI 29.41 29.25 29.77 24.83 25.39 26.15 24.54

## 2015-04-19 NOTE — Assessment & Plan Note (Signed)
Deteriorated due to increased family stress, increase xanax dose temporarily

## 2015-04-19 NOTE — Assessment & Plan Note (Signed)
Refusing cancer screening tests once more, states overwhelmed mentally , states will do in the future

## 2015-04-25 LAB — BASIC METABOLIC PANEL
BUN: 8 mg/dL (ref 7–25)
CO2: 25 mmol/L (ref 20–31)
Calcium: 9.6 mg/dL (ref 8.6–10.4)
Chloride: 102 mmol/L (ref 98–110)
Creat: 0.88 mg/dL (ref 0.50–1.05)
Glucose, Bld: 80 mg/dL (ref 65–99)
Potassium: 3.5 mmol/L (ref 3.5–5.3)
Sodium: 139 mmol/L (ref 135–146)

## 2015-04-25 LAB — MAGNESIUM: Magnesium: 2.3 mg/dL (ref 1.5–2.5)

## 2015-04-25 LAB — HEMOGLOBIN A1C
Hgb A1c MFr Bld: 5.7 % — ABNORMAL HIGH (ref <5.7)
Mean Plasma Glucose: 117 mg/dL — ABNORMAL HIGH (ref <117)

## 2015-04-25 LAB — CK TOTAL AND CKMB (NOT AT ARMC)
CK, MB: <0.7 ng/mL (ref 0.0–5.0)
Total CK: 80 U/L (ref 7–177)

## 2015-04-25 LAB — HEPATITIS C ANTIBODY: HCV Ab: NEGATIVE

## 2015-04-25 LAB — CALCIUM: Calcium: 9.6 mg/dL (ref 8.6–10.4)

## 2015-04-25 LAB — VITAMIN D 25 HYDROXY (VIT D DEFICIENCY, FRACTURES): Vit D, 25-Hydroxy: 45 ng/mL (ref 30–100)

## 2015-04-25 LAB — TSH: TSH: 1.27 mIU/L

## 2015-04-29 ENCOUNTER — Other Ambulatory Visit: Payer: Self-pay | Admitting: Family Medicine

## 2015-05-08 ENCOUNTER — Other Ambulatory Visit: Payer: Self-pay | Admitting: Family Medicine

## 2015-05-28 ENCOUNTER — Ambulatory Visit: Payer: BLUE CROSS/BLUE SHIELD | Admitting: Family Medicine

## 2015-05-30 ENCOUNTER — Other Ambulatory Visit: Payer: Self-pay | Admitting: Family Medicine

## 2015-06-03 ENCOUNTER — Other Ambulatory Visit: Payer: Self-pay

## 2015-06-03 MED ORDER — MEGESTROL ACETATE 40 MG PO TABS: 40.0000 mg | ORAL_TABLET | Freq: Every day | ORAL | Status: DC

## 2015-06-16 ENCOUNTER — Other Ambulatory Visit: Payer: Self-pay | Admitting: Family Medicine

## 2015-07-02 ENCOUNTER — Ambulatory Visit (INDEPENDENT_AMBULATORY_CARE_PROVIDER_SITE_OTHER): Payer: BLUE CROSS/BLUE SHIELD | Admitting: Family Medicine

## 2015-07-02 ENCOUNTER — Encounter: Payer: Self-pay | Admitting: Family Medicine

## 2015-07-02 VITALS — BP 136/80 | HR 84 | Resp 18 | Ht 63.0 in | Wt 163.0 lb

## 2015-07-02 DIAGNOSIS — F32A Depression, unspecified: Secondary | ICD-10-CM

## 2015-07-02 DIAGNOSIS — F329 Major depressive disorder, single episode, unspecified: Secondary | ICD-10-CM

## 2015-07-02 DIAGNOSIS — R7303 Prediabetes: Secondary | ICD-10-CM

## 2015-07-02 DIAGNOSIS — E663 Overweight: Secondary | ICD-10-CM

## 2015-07-02 DIAGNOSIS — G4700 Insomnia, unspecified: Secondary | ICD-10-CM

## 2015-07-02 DIAGNOSIS — E785 Hyperlipidemia, unspecified: Secondary | ICD-10-CM

## 2015-07-02 DIAGNOSIS — F17200 Nicotine dependence, unspecified, uncomplicated: Secondary | ICD-10-CM

## 2015-07-02 DIAGNOSIS — F411 Generalized anxiety disorder: Secondary | ICD-10-CM

## 2015-07-02 DIAGNOSIS — I10 Essential (primary) hypertension: Secondary | ICD-10-CM

## 2015-07-02 MED ORDER — TRAZODONE HCL 100 MG PO TABS: 100.0000 mg | ORAL_TABLET | Freq: Every day | ORAL | Status: DC

## 2015-07-02 NOTE — Progress Notes (Signed)
Subjective:    Patient ID: Catherine Mclean, female    DOB: 1960/08/12, 55 y.o.   MRN: [id number]  HPI   Catherine Mclean     MRN: [id number]      DOB: [birth year]   HPI Catherine Mclean is here for follow up and re-evaluation of chronic medical conditions, medication management and review of any available recent lab and radiology data.  Preventive health is updated, specifically  Cancer screening and Immunization.   Questions or concerns regarding consultations or procedures which the PT has had in the interim are  addressed. The PT denies any adverse reactions to current medications since the last visit.  There are no new concerns.  There are no specific complaints   ROS Denies recent fever or chills. Denies sinus pressure, nasal congestion, ear pain or sore throat. Denies chest congestion, productive cough or wheezing. Denies chest pains, palpitations and leg swelling Denies abdominal pain, nausea, vomiting,diarrhea or constipation.   Denies dysuria, frequency, hesitancy or incontinence. Denies joint pain, swelling and limitation in mobility. Denies headaches, seizures, numbness, or tingling. Denies depression, anxiety or insomnia. Denies skin break down or rash.   PE  BP 136/80 mmHg  Pulse 84  Resp 18  Ht 5\' 3"  (1.6 m)  Wt 163 lb (73.936 kg)  BMI 28.88 kg/m2  SpO2 97%  Patient alert and oriented and in no cardiopulmonary distress.  HEENT: No facial asymmetry, EOMI,   oropharynx pink and moist.  Neck supple no JVD, no mass.  Chest: Clear to auscultation bilaterally.  CVS: S1, S2 no murmurs, no S3.Regular rate.  ABD: Soft non tender.   Ext: No edema  MS: Adequate ROM spine, shoulders, hips and knees.  Skin: Intact, no ulcerations or rash noted.  Psych: Good eye contact, normal affect. Memory intact not anxious or depressed appearing.  CNS: CN 2-12 intact, power,  normal throughout.no focal deficits noted.   Assessment & Plan   Essential  hypertension Controlled, no change in medication DASH diet and commitment to daily physical activity for a minimum of 30 minutes discussed and encouraged, as a part of hypertension management. The importance of attaining a healthy weight is also discussed.  BP/Weight 07/02/2015 04/17/2015 09/03/2014 06/11/2014 12/24/2013 Q000111Q [id number]  Systolic BP XX123456 0000000 123XX123 0000000 Q000111Q 0000000 0000000  Diastolic BP 80 78 80 82 86 82 88  Wt. (Lbs) 163 166 165.08 168.04 140.12 148 143  BMI 28.88 29.41 29.25 29.77 24.83 25.39 26.15        Depression Marked improvement with symptom control on current medication, continue same,  No change in xanax dosing at this time  NICOTINE ADDICTION Patient counseled for approximately 5 minutes regarding the health risks of ongoing nicotine use, specifically all types of cancer, heart disease, stroke and respiratory failure. The options available for help with cessation ,the behavioral changes to assist the process, and the option to either gradully reduce usage  Or abruptly stop.is also discussed. Pt is also encouraged to set specific goals in number of cigarettes used daily, as well as to set a quit date.  Number of cigarettes/cigars currently smoking daily:10 to 15   Insomnia Sleep hygiene reviewed and written information offered also. Prescription sent for  medication needed. Controlled, no change in medication   GAD (generalized anxiety disorder) Improved,  No med chnage  Prediabetes Patient educated about the importance of limiting  Carbohydrate intake , the need to commit to daily physical activity for a minimum of 30 minutes , and to  commit weight loss. The fact that changes in all these areas will reduce or eliminate all together the development of diabetes is stressed.   Diabetic Labs Latest Ref Rng 04/24/2015 09/03/2014 06/11/2014 05/11/2013 04/12/2011  HbA1c <5.7 % 5.7(H) - 5.5 - -  Chol 0 - 200 mg/dL - - 210(H) 184 183  HDL >=46 mg/dL - - 57 51 -  Calc LDL 0 -  99 mg/dL - - 127(H) 118(H) -  Triglycerides <150 mg/dL - - 132 77 -  Creatinine 0.50 - 1.05 mg/dL 0.88 0.94 0.81 0.90 0.85   BP/Weight 07/02/2015 04/17/2015 09/03/2014 06/11/2014 12/24/2013 Q000111Q A999333  Systolic BP XX123456 0000000 123XX123 0000000 Q000111Q 0000000 0000000  Diastolic BP 80 78 80 82 86 82 88  Wt. (Lbs) 163 166 165.08 168.04 140.12 148 143  BMI 28.88 29.41 29.25 29.77 24.83 25.39 26.15   No flowsheet data found.     Overweight (BMI 25.0-29.9) Improved. Pt applauded on succesful weight loss through lifestyle change, and encouraged to continue same. Weight loss goal set for the next several months.   Hyperlipemia Hyperlipidemia:Low fat diet discussed and encouraged.   Lipid Panel  Lab Results  Component Value Date   CHOL 210* 06/11/2014   HDL 57 06/11/2014   LDLCALC 127* 06/11/2014   TRIG 132 06/11/2014   CHOLHDL 3.7 06/11/2014   Updated lab needed           Review of Systems     Objective:   Physical Exam        Assessment & Plan:

## 2015-07-02 NOTE — Patient Instructions (Signed)
F/u in 81month, call if you need me before  STOP zolpidem, increase trazodone  To 50 mg TWO at night , NEW script is 100 mg ONE at night  CaLL if  A problem  Megace phase off, so what you have left take every other day till done  IF YOU BLEED when you stop need o call fir gyne eval  You are improved  Continue to work on feeling even better and get any help you need  Copntinue to cut back on cigarettes

## 2015-07-06 DIAGNOSIS — R7303 Prediabetes: Secondary | ICD-10-CM | POA: Insufficient documentation

## 2015-07-06 DIAGNOSIS — E663 Overweight: Secondary | ICD-10-CM | POA: Insufficient documentation

## 2015-07-06 NOTE — Assessment & Plan Note (Signed)
Hyperlipidemia:Low fat diet discussed and encouraged.   Lipid Panel  Lab Results  Component Value Date   CHOL 210* 06/11/2014   HDL 57 06/11/2014   LDLCALC 127* 06/11/2014   TRIG 132 06/11/2014   CHOLHDL 3.7 06/11/2014   Updated lab needed

## 2015-07-06 NOTE — Assessment & Plan Note (Signed)
Improved. Pt applauded on succesful weight loss through lifestyle change, and encouraged to continue same. Weight loss goal set for the next several months.  

## 2015-07-06 NOTE — Assessment & Plan Note (Signed)
Patient counseled for approximately 5 minutes regarding the health risks of ongoing nicotine use, specifically all types of cancer, heart disease, stroke and respiratory failure. The options available for help with cessation ,the behavioral changes to assist the process, and the option to either gradully reduce usage  Or abruptly stop.is also discussed. Pt is also encouraged to set specific goals in number of cigarettes used daily, as well as to set a quit date.  Number of cigarettes/cigars currently smoking daily: 10 to 15  

## 2015-07-06 NOTE — Assessment & Plan Note (Signed)
Sleep hygiene reviewed and written information offered also. Prescription sent for  medication needed. Controlled, no change in medication  

## 2015-07-06 NOTE — Assessment & Plan Note (Signed)
Marked improvement with symptom control on current medication, continue same,  No change in xanax dosing at this time

## 2015-07-06 NOTE — Assessment & Plan Note (Signed)
Improved,  No med chnage

## 2015-07-06 NOTE — Assessment & Plan Note (Signed)
Controlled, no change in medication DASH diet and commitment to daily physical activity for a minimum of 30 minutes discussed and encouraged, as a part of hypertension management. The importance of attaining a healthy weight is also discussed.  BP/Weight 07/02/2015 04/17/2015 09/03/2014 06/11/2014 12/24/2013 Q000111Q A999333  Systolic BP XX123456 0000000 123XX123 0000000 Q000111Q 0000000 0000000  Diastolic BP 80 78 80 82 86 82 88  Wt. (Lbs) 163 166 165.08 168.04 140.12 148 143  BMI 28.88 29.41 29.25 29.77 24.83 25.39 26.15

## 2015-07-06 NOTE — Assessment & Plan Note (Signed)
Patient educated about the importance of limiting  Carbohydrate intake , the need to commit to daily physical activity for a minimum of 30 minutes , and to commit weight loss. The fact that changes in all these areas will reduce or eliminate all together the development of diabetes is stressed.   Diabetic Labs Latest Ref Rng 04/24/2015 09/03/2014 06/11/2014 05/11/2013 04/12/2011  HbA1c <5.7 % 5.7(H) - 5.5 - -  Chol 0 - 200 mg/dL - - 210(H) 184 183  HDL >=46 mg/dL - - 57 51 -  Calc LDL 0 - 99 mg/dL - - 127(H) 118(H) -  Triglycerides <150 mg/dL - - 132 77 -  Creatinine 0.50 - 1.05 mg/dL 0.88 0.94 0.81 0.90 0.85   BP/Weight 07/02/2015 04/17/2015 09/03/2014 06/11/2014 12/24/2013 Q000111Q A999333  Systolic BP XX123456 0000000 123XX123 0000000 Q000111Q 0000000 0000000  Diastolic BP 80 78 80 82 86 82 88  Wt. (Lbs) 163 166 165.08 168.04 140.12 148 143  BMI 28.88 29.41 29.25 29.77 24.83 25.39 26.15   No flowsheet data found.

## 2015-07-07 ENCOUNTER — Other Ambulatory Visit: Payer: Self-pay | Admitting: Family Medicine

## 2015-07-07 ENCOUNTER — Telehealth: Payer: Self-pay | Admitting: Family Medicine

## 2015-07-07 MED ORDER — ZOLPIDEM TARTRATE 5 MG PO TABS: 5.0000 mg | ORAL_TABLET | Freq: Every evening | ORAL | Status: DC | PRN

## 2015-07-07 NOTE — Telephone Encounter (Signed)
Patient is calling stating that her sleeping pills is not working at all, she said she is wide awake please advise?

## 2015-07-07 NOTE — Telephone Encounter (Signed)
She is to add zolpidem one at bedtime, script printed pls call her in am about this and fax in script in your box, thanks

## 2015-07-08 NOTE — Telephone Encounter (Signed)
Patient aware and medication sent to pharmacy  

## 2015-08-06 ENCOUNTER — Other Ambulatory Visit: Payer: Self-pay | Admitting: Family Medicine

## 2015-09-04 ENCOUNTER — Other Ambulatory Visit: Payer: Self-pay | Admitting: Family Medicine

## 2015-09-08 ENCOUNTER — Other Ambulatory Visit: Payer: Self-pay | Admitting: Family Medicine

## 2015-10-07 ENCOUNTER — Other Ambulatory Visit: Payer: Self-pay | Admitting: Family Medicine

## 2015-10-09 ENCOUNTER — Other Ambulatory Visit: Payer: Self-pay | Admitting: Family Medicine

## 2015-10-22 ENCOUNTER — Ambulatory Visit: Payer: BLUE CROSS/BLUE SHIELD | Admitting: Family Medicine

## 2015-11-05 ENCOUNTER — Other Ambulatory Visit: Payer: Self-pay | Admitting: Family Medicine

## 2015-11-05 DIAGNOSIS — F329 Major depressive disorder, single episode, unspecified: Secondary | ICD-10-CM

## 2015-11-05 DIAGNOSIS — F32A Depression, unspecified: Secondary | ICD-10-CM

## 2015-11-13 ENCOUNTER — Ambulatory Visit (INDEPENDENT_AMBULATORY_CARE_PROVIDER_SITE_OTHER): Payer: BLUE CROSS/BLUE SHIELD

## 2015-11-13 DIAGNOSIS — Z23 Encounter for immunization: Secondary | ICD-10-CM

## 2015-11-19 ENCOUNTER — Ambulatory Visit: Payer: BLUE CROSS/BLUE SHIELD | Admitting: Family Medicine

## 2015-12-04 ENCOUNTER — Ambulatory Visit (INDEPENDENT_AMBULATORY_CARE_PROVIDER_SITE_OTHER): Payer: BLUE CROSS/BLUE SHIELD | Admitting: Family Medicine

## 2015-12-04 ENCOUNTER — Encounter: Payer: Self-pay | Admitting: Family Medicine

## 2015-12-04 VITALS — BP 138/82 | HR 62 | Ht 63.0 in | Wt 157.0 lb

## 2015-12-04 DIAGNOSIS — F17200 Nicotine dependence, unspecified, uncomplicated: Secondary | ICD-10-CM | POA: Diagnosis not present

## 2015-12-04 DIAGNOSIS — E782 Mixed hyperlipidemia: Secondary | ICD-10-CM | POA: Diagnosis not present

## 2015-12-04 DIAGNOSIS — N939 Abnormal uterine and vaginal bleeding, unspecified: Secondary | ICD-10-CM

## 2015-12-04 DIAGNOSIS — F5101 Primary insomnia: Secondary | ICD-10-CM

## 2015-12-04 DIAGNOSIS — I10 Essential (primary) hypertension: Secondary | ICD-10-CM

## 2015-12-04 DIAGNOSIS — Z9119 Patient's noncompliance with other medical treatment and regimen: Secondary | ICD-10-CM

## 2015-12-04 DIAGNOSIS — F324 Major depressive disorder, single episode, in partial remission: Secondary | ICD-10-CM

## 2015-12-04 DIAGNOSIS — Z91199 Patient's noncompliance with other medical treatment and regimen due to unspecified reason: Secondary | ICD-10-CM

## 2015-12-04 DIAGNOSIS — R7303 Prediabetes: Secondary | ICD-10-CM | POA: Diagnosis not present

## 2015-12-04 NOTE — Patient Instructions (Addendum)
F/u in 4.5 month, call if you need me before  PLEASE get mammogram done, pAST due  You are referred to Dr Elonda Husky re menstrual bleeding   Fasting lipid, chem 7 , hBA1C and CBC are past due please get asap  Please work on good  health habits so that your health will improve. 1. Commitment to daily physical activity for 30 to 60  minutes, if you are able to do this.  2. Commitment to wise food choices. Aim for half of your  food intake to be vegetable and fruit, one quarter starchy foods, and one quarter protein. Try to eat on a regular schedule  3 meals per day, snacking between meals should be limited to vegetables or fruits or small portions of nuts. 64 ounces of water per day is generally recommended, unless you have specific health conditions, like heart failure or kidney failure where you will need to limit fluid intake.  3. Commitment to sufficient and a  good quality of physical and mental rest daily, generally between 6 to 8 hours per day.  WITH PERSISTANCE AND PERSEVERANCE, THE IMPOSSIBLE , BECOMES THE NORM! Thank you  for choosing [facility]. We consider it a privelige to serve you.  Delivering excellent health care in a caring and  compassionate way is our goal.  Partnering with you,  so that together we can achieve this goal is our strategy.

## 2015-12-08 ENCOUNTER — Other Ambulatory Visit: Payer: Self-pay | Admitting: Family Medicine

## 2015-12-14 NOTE — Assessment & Plan Note (Signed)
Improved, continue medication  

## 2015-12-14 NOTE — Progress Notes (Signed)
Catherine Mclean     MRN: [id number]      DOB: Sep 18, 1960   HPI Catherine Mclean is here for follow up and re-evaluation of chronic medical conditions, medication management and review of any available recent lab and radiology data.  Preventive health is updated, specifically  Cancer screening and Immunization.   Questions or concerns regarding consultations or procedures which the PT has had in the interim are  addressed. The PT denies any adverse reactions to current medications since the last visit.  C/o resuming menses since discontinuing megace several months ago, will need gyne re evaluation due to advnced age  ROS Denies recent fever or chills. Denies sinus pressure, nasal congestion, ear pain or sore throat. Denies chest congestion, productive cough or wheezing. Denies chest pains, palpitations and leg swelling Denies abdominal pain, nausea, vomiting,diarrhea or constipation.   Denies dysuria, frequency, hesitancy or incontinence. Denies joint pain, swelling and limitation in mobility. Denies headaches, seizures, numbness, or tingling. Denies uncontrolled depression, anxiety or insomnia.Takes medication for all 3, stil visits her parents graves daily, though not as tearful, will reduce xanax dose next visit Denies skin break down or rash.   PE  BP 138/82 (BP Location: Left Arm, Patient Position: Sitting, Cuff Size: Normal)   Pulse 62   Ht 5\' 3"  (1.6 m)   Wt 157 lb (71.2 kg)   SpO2 98%   BMI 27.81 kg/m   Patient alert and oriented and in no cardiopulmonary distress.  HEENT: No facial asymmetry, EOMI,   oropharynx pink and moist.  Neck supple no JVD, no mass.  Chest: Clear to auscultation bilaterally.  CVS: S1, S2 no murmurs, no S3.Regular rate.  ABD: Soft non tender.   Ext: No edema  MS: Adequate ROM spine, shoulders, hips and knees.  Skin: Intact, no ulcerations or rash noted.  Psych: Good eye contact, normal affect. Memory intact not anxious or depressed  appearing.  CNS: CN 2-12 intact, power,  normal throughout.no focal deficits noted.   Assessment & Plan  Essential hypertension Controlled, no change in medication DASH diet and commitment to daily physical activity for a minimum of 30 minutes discussed and encouraged, as a part of hypertension management. The importance of attaining a healthy weight is also discussed.  BP/Weight 12/04/2015 07/02/2015 04/17/2015 09/03/2014 06/11/2014 12/24/2013 Q000111Q  Systolic BP 0000000 XX123456 0000000 123XX123 0000000 Q000111Q 0000000  Diastolic BP 82 80 78 80 82 86 82  Wt. (Lbs) 157 163 166 165.08 168.04 140.12 148  BMI 27.81 28.88 29.41 29.25 29.77 24.83 25.39       NICOTINE ADDICTION Patient counseled for approximately 5 minutes regarding the health risks of ongoing nicotine use, specifically all types of cancer, heart disease, stroke and respiratory failure. The options available for help with cessation ,the behavioral changes to assist the process, and the option to either gradully reduce usage  Or abruptly stop.is also discussed. Pt is also encouraged to set specific goals in number of cigarettes used daily, as well as to set a quit date.     Insomnia Sleep hygiene reviewed and written information offered also. Prescription sent for  medication needed.   Depression Improved, continue medication  Prediabetes Patient educated about the importance of limiting  Carbohydrate intake , the need to commit to daily physical activity for a minimum of 30 minutes , and to commit weight loss. The fact that changes in all these areas will reduce or eliminate all together the development of diabetes is stressed.  Updated lab  needed at/ before next visit.   Diabetic Labs Latest Ref Rng & Units 04/24/2015 09/03/2014 06/11/2014 05/11/2013 04/12/2011  HbA1c <5.7 % 5.7(H) - 5.5 - -  Chol 0 - 200 mg/dL - - 210(H) 184 183  HDL >=46 mg/dL - - 57 51 -  Calc LDL 0 - 99 mg/dL - - 127(H) 118(H) -  Triglycerides <150 mg/dL - - 132 77 -   Creatinine 0.50 - 1.05 mg/dL 0.88 0.94 0.81 0.90 0.85   BP/Weight 12/04/2015 07/02/2015 04/17/2015 09/03/2014 06/11/2014 12/24/2013 Q000111Q  Systolic BP 0000000 XX123456 0000000 123XX123 0000000 Q000111Q 0000000  Diastolic BP 82 80 78 80 82 86 82  Wt. (Lbs) 157 163 166 165.08 168.04 140.12 148  BMI 27.81 28.88 29.41 29.25 29.77 24.83 25.39   No flowsheet data found.    Medical non-compliance Cancer screening still outstanding, re educated  Abnormal uterine and vaginal bleeding, unspecified (CODE) Discontinuation of megace has led to resumed bleeding, needs gyne re veal , esp in light of advanced age

## 2015-12-14 NOTE — Assessment & Plan Note (Signed)
Cancer screening still outstanding, re educated

## 2015-12-14 NOTE — Assessment & Plan Note (Signed)
Controlled, no change in medication DASH diet and commitment to daily physical activity for a minimum of 30 minutes discussed and encouraged, as a part of hypertension management. The importance of attaining a healthy weight is also discussed.  BP/Weight 12/04/2015 07/02/2015 04/17/2015 09/03/2014 06/11/2014 12/24/2013 Q000111Q  Systolic BP 0000000 XX123456 0000000 123XX123 0000000 Q000111Q 0000000  Diastolic BP 82 80 78 80 82 86 82  Wt. (Lbs) 157 163 166 165.08 168.04 140.12 148  BMI 27.81 28.88 29.41 29.25 29.77 24.83 25.39

## 2015-12-14 NOTE — Assessment & Plan Note (Signed)
Discontinuation of megace has led to resumed bleeding, needs gyne re veal , esp in light of advanced age

## 2015-12-14 NOTE — Assessment & Plan Note (Signed)

## 2015-12-14 NOTE — Assessment & Plan Note (Signed)
Patient educated about the importance of limiting  Carbohydrate intake , the need to commit to daily physical activity for a minimum of 30 minutes , and to commit weight loss. The fact that changes in all these areas will reduce or eliminate all together the development of diabetes is stressed.  Updated lab needed at/ before next visit.   Diabetic Labs Latest Ref Rng & Units 04/24/2015 09/03/2014 06/11/2014 05/11/2013 04/12/2011  HbA1c <5.7 % 5.7(H) - 5.5 - -  Chol 0 - 200 mg/dL - - 210(H) 184 183  HDL >=46 mg/dL - - 57 51 -  Calc LDL 0 - 99 mg/dL - - 127(H) 118(H) -  Triglycerides <150 mg/dL - - 132 77 -  Creatinine 0.50 - 1.05 mg/dL 0.88 0.94 0.81 0.90 0.85   BP/Weight 12/04/2015 07/02/2015 04/17/2015 09/03/2014 06/11/2014 12/24/2013 Q000111Q  Systolic BP 0000000 XX123456 0000000 123XX123 0000000 Q000111Q 0000000  Diastolic BP 82 80 78 80 82 86 82  Wt. (Lbs) 157 163 166 165.08 168.04 140.12 148  BMI 27.81 28.88 29.41 29.25 29.77 24.83 25.39   No flowsheet data found.

## 2015-12-14 NOTE — Assessment & Plan Note (Signed)
Sleep hygiene reviewed and written information offered also. Prescription sent for  medication needed.  

## 2016-01-01 ENCOUNTER — Encounter: Payer: Self-pay | Admitting: Adult Health

## 2016-01-01 ENCOUNTER — Encounter: Payer: Self-pay | Admitting: *Deleted

## 2016-01-05 ENCOUNTER — Other Ambulatory Visit: Payer: Self-pay | Admitting: Family Medicine

## 2016-01-05 DIAGNOSIS — F32A Depression, unspecified: Secondary | ICD-10-CM

## 2016-01-05 DIAGNOSIS — F329 Major depressive disorder, single episode, unspecified: Secondary | ICD-10-CM

## 2016-02-04 ENCOUNTER — Emergency Department (HOSPITAL_COMMUNITY): Payer: BLUE CROSS/BLUE SHIELD

## 2016-02-04 ENCOUNTER — Encounter (HOSPITAL_COMMUNITY): Payer: Self-pay | Admitting: Emergency Medicine

## 2016-02-04 ENCOUNTER — Emergency Department (HOSPITAL_COMMUNITY)
Admission: EM | Admit: 2016-02-04 | Discharge: 2016-02-04 | Disposition: A | Discharge status: Home / Self Care | Payer: BLUE CROSS/BLUE SHIELD | Attending: Emergency Medicine | Admitting: Emergency Medicine

## 2016-02-04 DIAGNOSIS — I10 Essential (primary) hypertension: Secondary | ICD-10-CM | POA: Diagnosis not present

## 2016-02-04 DIAGNOSIS — R531 Weakness: Secondary | ICD-10-CM | POA: Diagnosis not present

## 2016-02-04 DIAGNOSIS — F17200 Nicotine dependence, unspecified, uncomplicated: Secondary | ICD-10-CM | POA: Insufficient documentation

## 2016-02-04 DIAGNOSIS — Z79899 Other long term (current) drug therapy: Secondary | ICD-10-CM | POA: Diagnosis not present

## 2016-02-04 DIAGNOSIS — R42 Dizziness and giddiness: Secondary | ICD-10-CM | POA: Diagnosis not present

## 2016-02-04 LAB — URINALYSIS, ROUTINE W REFLEX MICROSCOPIC
Bilirubin Urine: NEGATIVE
Glucose, UA: NEGATIVE mg/dL
Hgb urine dipstick: NEGATIVE
Ketones, ur: NEGATIVE mg/dL
Leukocytes, UA: NEGATIVE
Nitrite: NEGATIVE
Protein, ur: NEGATIVE mg/dL
Specific Gravity, Urine: 1.003 — ABNORMAL LOW (ref 1.005–1.030)
pH: 7 (ref 5.0–8.0)

## 2016-02-04 LAB — CBC
HCT: 40.3 % (ref 36.0–46.0)
Hemoglobin: 13.2 g/dL (ref 12.0–15.0)
MCH: 27.7 pg (ref 26.0–34.0)
MCHC: 32.8 g/dL (ref 30.0–36.0)
MCV: 84.7 fL (ref 78.0–100.0)
Platelets: 197 10*3/uL (ref 150–400)
RBC: 4.76 MIL/uL (ref 3.87–5.11)
RDW: 14.8 % (ref 11.5–15.5)
WBC: 8.2 10*3/uL (ref 4.0–10.5)

## 2016-02-04 LAB — BASIC METABOLIC PANEL
Anion gap: 7 (ref 5–15)
BUN: 7 mg/dL (ref 6–20)
CO2: 23 mmol/L (ref 22–32)
Calcium: 9.5 mg/dL (ref 8.9–10.3)
Chloride: 105 mmol/L (ref 101–111)
Creatinine, Ser: 0.91 mg/dL (ref 0.44–1.00)
GFR calc Af Amer: >60 mL/min (ref >60)
GFR calc non Af Amer: >60 mL/min (ref >60)
Glucose, Bld: 124 mg/dL — ABNORMAL HIGH (ref 65–99)
Potassium: 3.4 mmol/L — ABNORMAL LOW (ref 3.5–5.1)
Sodium: 135 mmol/L (ref 135–145)

## 2016-02-04 LAB — HEPATIC FUNCTION PANEL
ALT: 18 U/L (ref 14–54)
AST: 36 U/L (ref 15–41)
Albumin: 4.1 g/dL (ref 3.5–5.0)
Alkaline Phosphatase: 94 U/L (ref 38–126)
Bilirubin, Direct: <0.1 mg/dL — ABNORMAL LOW (ref 0.1–0.5)
Total Bilirubin: 0.5 mg/dL (ref 0.3–1.2)
Total Protein: 7.9 g/dL (ref 6.5–8.1)

## 2016-02-04 LAB — MAGNESIUM: Magnesium: 2.1 mg/dL (ref 1.7–2.4)

## 2016-02-04 MED ORDER — PROMETHAZINE HCL 25 MG PO TABS: 25.0000 mg | ORAL_TABLET | Freq: Four times a day (QID) | ORAL | 0 refills | Status: DC | PRN

## 2016-02-04 MED ORDER — MECLIZINE HCL 25 MG PO TABS: 25.0000 mg | ORAL_TABLET | Freq: Three times a day (TID) | ORAL | 0 refills | Status: DC | PRN

## 2016-02-04 MED ORDER — SODIUM CHLORIDE 0.9 % IV BOLUS (SEPSIS)
1000.0000 mL | Freq: Once | INTRAVENOUS | Status: AC
  Administered 2016-02-04: 1000 mL via INTRAVENOUS

## 2016-02-04 MED ORDER — ONDANSETRON HCL 4 MG/2ML IJ SOLN
4.0000 mg | Freq: Once | INTRAMUSCULAR | Status: AC
  Administered 2016-02-04: 4 mg via INTRAVENOUS
  Filled 2016-02-04: qty 2

## 2016-02-04 NOTE — ED Provider Notes (Signed)
[facility] DEPT Provider Note   CSN: [id number] Arrival date & time: 02/04/16  [id number]  By signing my name below, I, Catherine Mclean, attest that this documentation has been prepared under the direction and in the presence of Nat Christen, MD. Electronically Signed: Hansel Mclean, ED Scribe. 02/04/16. 8:18 AM.    History   Chief Complaint Chief Complaint  Patient presents with  . Dizziness  . Weakness    HPI Catherine Mclean is a 55 y.o. female who presents to the Emergency Department complaining of moderate dizziness and weakness since leaving work yesterday afternoon. Pt states this is very unusual for her. She states her legs in particular feel weak. She feels as though she does not have energy to move. No worsening or alleviating factors noted. She works as a Psychologist, counselling and reports she attempted to go to work this morning, but did not have the energy. Pt has a h/o HTN for which she is taking medication. She denies CP, dyspnea, fever, chills, sweats.    The history is provided by the patient. No language interpreter was used.    Past Medical History:  Diagnosis Date  . Depression   . Hypertension     Patient Active Problem List   Diagnosis Date Noted  . Prediabetes 07/06/2015  . Overweight (BMI 25.0-29.9) 07/06/2015  . Spasm of muscle 09/03/2014  . Abnormal uterine and vaginal bleeding, unspecified (CODE) 06/12/2014  . Depression 12/24/2013  . Medical non-compliance 05/06/2013  . GAD (generalized anxiety disorder) 05/03/2013  . Insomnia 05/03/2013  . ANEMIA, HX OF 11/20/2008  . Hyperlipemia 01/29/2008  . NICOTINE ADDICTION 10/26/2007  . Essential hypertension 05/12/2007    Past Surgical History:  Procedure Laterality Date  . OVARY SURGERY      OB History    No data available       Home Medications    Prior to Admission medications   Medication Sig Start Date End Date Taking? Authorizing Provider  ALPRAZolam Duanne Moron) 0.5 MG tablet TAKE 1 TABLET BY MOUTH  THREE TIMES DAILY 01/05/16  Yes Fayrene Helper, MD  bisoprolol-hydrochlorothiazide [facility]) 5-6.25 MG tablet TAKE 1 TABLET BY MOUTH DAILY 10/07/15  Yes Fayrene Helper, MD  cyclobenzaprine (FLEXERIL) 10 MG tablet TAKE 1 TABLET BY MOUTH THREE TIMES DAILY AS NEEDED FOR MUSCLES SPASMS 12/09/15  Yes Fayrene Helper, MD  traZODone (DESYREL) 100 MG tablet TAKE 1 TABLET BY MOUTH EVERY NIGHT AT BEDTIME 01/05/16  Yes Fayrene Helper, MD  Vitamin D, Ergocalciferol, (DRISDOL) 50000 units CAPS capsule TAKE 1 CAPSULE BY MOUTH ONCE A WEEK 01/05/16  Yes Fayrene Helper, MD  zolpidem (AMBIEN) 5 MG tablet TAKE 1 TABLET BY MOUTH EVERY DAY AT BEDTIME FOR SLEEP 12/09/15  Yes Fayrene Helper, MD  meclizine (ANTIVERT) 25 MG tablet Take 1 tablet (25 mg total) by mouth 3 (three) times daily as needed for dizziness. 02/04/16   Nat Christen, MD  promethazine (PHENERGAN) 25 MG tablet Take 1 tablet (25 mg total) by mouth every 6 (six) hours as needed. 02/04/16   Nat Christen, MD    Family History Family History  Problem Relation Age of Onset  . Hypertension Sister   . Hypertension Sister   . Hyperlipidemia Sister   . Cancer Sister   . Arthritis/Rheumatoid Sister     Social History Social History  Substance Use Topics  . Smoking status: Current Every Day Smoker    Packs/day: 0.50  . Smokeless tobacco: Never Used  . Alcohol use Yes  Comment: occ     Allergies   Hydrochlorothiazide w-triamterene   Review of Systems Review of Systems A complete 10 system review of systems was obtained and all systems are negative except as noted in the HPI and PMH.    Physical Exam Updated Vital Signs BP (!) 137/118   Pulse (!) 51   Temp 97.8 F (36.6 C) (Oral)   Resp 18   Ht 5\' 3"  (1.6 m)   Wt 160 lb (72.6 kg)   SpO2 97%   BMI 28.34 kg/m   Physical Exam  Constitutional: She is oriented to person, place, and time. She appears well-developed and well-nourished.  No obvious neurological deficits.     HENT:  Head: Normocephalic and atraumatic.  Eyes: Conjunctivae are normal.  Neck: Neck supple.  Cardiovascular: Normal rate and regular rhythm.   Pulmonary/Chest: Effort normal and breath sounds normal.  Abdominal: Soft. Bowel sounds are normal.  Musculoskeletal: Normal range of motion.  Neurological: She is alert and oriented to person, place, and time.  FROM arms and legs, but does not move her limbs briskly.   Skin: Skin is warm and dry.  Psychiatric: She has a normal mood and affect. Her behavior is normal.  Nursing note and vitals reviewed.   ED Treatments / Results   DIAGNOSTIC STUDIES: Oxygen Saturation is 100% on RA, normal by my interpretation.    COORDINATION OF CARE: 8:13 AM Discussed treatment plan with pt at bedside which includes labs, CT head, IVF and pt agreed to plan.    Labs (all labs ordered are listed, but only abnormal results are displayed) Labs Reviewed  BASIC METABOLIC PANEL - Abnormal; Notable for the following:       Result Value   Potassium 3.4 (*)    Glucose, Bld 124 (*)    All other components within normal limits  URINALYSIS, ROUTINE W REFLEX MICROSCOPIC - Abnormal; Notable for the following:    Color, Urine STRAW (*)    Specific Gravity, Urine 1.003 (*)    All other components within normal limits  HEPATIC FUNCTION PANEL - Abnormal; Notable for the following:    Bilirubin, Direct <0.1 (*)    All other components within normal limits  CBC  MAGNESIUM    EKG  EKG Interpretation None       Radiology Ct Head Wo Contrast  Result Date: 02/04/2016 CLINICAL DATA:  Dizziness, weakness. EXAM: CT HEAD WITHOUT CONTRAST TECHNIQUE: Contiguous axial images were obtained from the base of the skull through the vertex without intravenous contrast. COMPARISON:  None. FINDINGS: Brain: No acute intracranial abnormality. Specifically, no hemorrhage, hydrocephalus, mass lesion, acute infarction, or significant intracranial injury. Vascular: No hyperdense  vessel or unexpected calcification. Skull: No acute calvarial abnormality. Sinuses/Orbits: No acute findings. Other: None IMPRESSION: Normal study. Electronically Signed   By: Rolm Baptise M.D.   On: 02/04/2016 09:04    Procedures Procedures (including critical care time)  Medications Ordered in ED Medications  sodium chloride 0.9 % bolus 1,000 mL (0 mLs Intravenous Stopped 02/04/16 0901)  ondansetron (ZOFRAN) injection 4 mg (4 mg Intravenous Given 02/04/16 0756)     Initial Impression / Assessment and Plan / ED Course  I have reviewed the triage vital signs and the nursing notes.  Pertinent labs & imaging results that were available during my care of the patient were reviewed by me and considered in my medical decision making (see chart for details).  Clinical Course     Patient is feeling much better after  IV fluids. Her dizziness and weakness have improved. Labs are reassuring. CT head shows no acute findings. Discharge medications Antivert 25 mg and Phenergan 25 mg  Final Clinical Impressions(s) / ED Diagnoses   Final diagnoses:  Dizziness  Weakness    New Prescriptions New Prescriptions   MECLIZINE (ANTIVERT) 25 MG TABLET    Take 1 tablet (25 mg total) by mouth 3 (three) times daily as needed for dizziness.   PROMETHAZINE (PHENERGAN) 25 MG TABLET    Take 1 tablet (25 mg total) by mouth every 6 (six) hours as needed.    I personally performed the services described in this documentation, which was scribed in my presence. The recorded information has been reviewed and is accurate.     Nat Christen, MD 02/04/16 [id number]

## 2016-02-04 NOTE — Discharge Instructions (Signed)
Tests showed no life-threatening condition. Medication for nausea and dizziness. Increase fluids. Rest. Return if worse.

## 2016-02-04 NOTE — ED Triage Notes (Signed)
Patient states dizziness and weakness when she woke up this morning. States dizziness worse when she stands up.

## 2016-03-09 ENCOUNTER — Other Ambulatory Visit: Payer: Self-pay | Admitting: Family Medicine

## 2016-03-09 NOTE — Telephone Encounter (Signed)
pts last vit d level on 3 2 17  was 45 Should she remain on this or take otc?

## 2016-04-11 ENCOUNTER — Other Ambulatory Visit: Payer: Self-pay | Admitting: Family Medicine

## 2016-04-21 ENCOUNTER — Encounter: Payer: Self-pay | Admitting: Family Medicine

## 2016-04-21 ENCOUNTER — Ambulatory Visit (INDEPENDENT_AMBULATORY_CARE_PROVIDER_SITE_OTHER): Payer: BLUE CROSS/BLUE SHIELD | Admitting: Family Medicine

## 2016-04-21 VITALS — BP 120/80 | HR 56 | Temp 96.6°F | Resp 18 | Ht 63.0 in | Wt 155.0 lb

## 2016-04-21 DIAGNOSIS — E663 Overweight: Secondary | ICD-10-CM | POA: Diagnosis not present

## 2016-04-21 DIAGNOSIS — Z1239 Encounter for other screening for malignant neoplasm of breast: Secondary | ICD-10-CM

## 2016-04-21 DIAGNOSIS — F17200 Nicotine dependence, unspecified, uncomplicated: Secondary | ICD-10-CM | POA: Diagnosis not present

## 2016-04-21 DIAGNOSIS — Z1231 Encounter for screening mammogram for malignant neoplasm of breast: Secondary | ICD-10-CM | POA: Diagnosis not present

## 2016-04-21 DIAGNOSIS — E782 Mixed hyperlipidemia: Secondary | ICD-10-CM | POA: Diagnosis not present

## 2016-04-21 DIAGNOSIS — I10 Essential (primary) hypertension: Secondary | ICD-10-CM | POA: Diagnosis not present

## 2016-04-21 DIAGNOSIS — F329 Major depressive disorder, single episode, unspecified: Secondary | ICD-10-CM

## 2016-04-21 DIAGNOSIS — F411 Generalized anxiety disorder: Secondary | ICD-10-CM | POA: Diagnosis not present

## 2016-04-21 DIAGNOSIS — R7303 Prediabetes: Secondary | ICD-10-CM | POA: Diagnosis not present

## 2016-04-21 MED ORDER — ALPRAZOLAM 0.5 MG PO TABS: ORAL_TABLET | ORAL | 4 refills | Status: DC

## 2016-04-21 MED ORDER — BUSPIRONE HCL 5 MG PO TABS: 5.0000 mg | ORAL_TABLET | Freq: Two times a day (BID) | ORAL | 4 refills | Status: DC

## 2016-04-21 NOTE — Assessment & Plan Note (Signed)

## 2016-04-21 NOTE — Progress Notes (Signed)
Catherine Mclean     MRN: [id number]      DOB: 12-02-1960   HPI Catherine Mclean is here for follow up and re-evaluation of chronic medical conditions, medication management and review of any available recent lab and radiology data.  Preventive health is updated, specifically  Cancer screening and Immunization.    There are no new concerns.  There are no specific complaints   ROS Denies recent fever or chills. Denies sinus pressure, nasal congestion, ear pain or sore throat. Denies chest congestion, productive cough or wheezing. Denies chest pains, palpitations and leg swelling Denies abdominal pain, nausea, vomiting,diarrhea or constipation.   Denies dysuria, frequency, hesitancy or incontinence. Denies joint pain, swelling and limitation in mobility. Denies headaches, seizures, numbness, or tingling. Denies depression, anxiety or insomnia. Denies skin break down or rash.   PE  BP 120/80 (BP Location: Left Arm, Patient Position: Sitting, Cuff Size: Normal)   Pulse (!) 56   Temp (!) 96.6 F (35.9 C) (Temporal)   Resp 18   Ht 5\' 3"  (1.6 m)   Wt 155 lb 0.6 oz (70.3 kg)   SpO2 100%   BMI 27.46 kg/m   Patient alert and oriented and in no cardiopulmonary distress.  HEENT: No facial asymmetry, EOMI,   oropharynx pink and moist.  Neck supple no JVD, no mass.  Chest: Clear to auscultation bilaterally.  CVS: S1, S2 no murmurs, no S3.Regular rate.  ABD: Soft non tender.   Ext: No edema  MS: Adequate ROM spine, shoulders, hips and knees.  Skin: Intact, no ulcerations or rash noted.  Psych: Good eye contact, normal affect. Memory intact not anxious or depressed appearing.  CNS: CN 2-12 intact, power,  normal throughout.no focal deficits noted.   Assessment & Plan  Essential hypertension Controlled, no change in medication DASH diet and commitment to daily physical activity for a minimum of 30 minutes discussed and encouraged, as a part of hypertension management. The  importance of attaining a healthy weight is also discussed.  BP/Weight 04/21/2016 02/04/2016 12/04/2015 07/02/2015 04/17/2015 09/03/2014 [id number]  Systolic BP 123456 123456 0000000 [id number] 0000000 [id number] 0000000  Diastolic BP 80 76 82 80 78 80 82  Wt. (Lbs) 155.04 160 157 163 166 165.08 168.04  BMI 27.46 28.34 27.81 28.88 29.41 29.25 29.77       NICOTINE ADDICTION Patient counseled for approximately 5 minutes regarding the health risks of ongoing nicotine use, specifically all types of cancer, heart disease, stroke and respiratory failure. The options available for help with cessation ,the behavioral changes to assist the process, and the option to either gradully reduce usage  Or abruptly stop.is also discussed. Pt is also encouraged to set specific goals in number of cigarettes used daily, as well as to set a quit date.     Overweight (BMI 25.0-29.9) Improved Patient re-educated about  the importance of commitment to a  minimum of 150 minutes of exercise per week.  The importance of healthy food choices with portion control discussed. Encouraged to start a food diary, count calories and to consider  joining a support group. Sample diet sheets offered. Goals set by the patient for the next several months.   Weight /BMI 04/21/2016 02/04/2016 12/04/2015  WEIGHT 155 lb 0.6 oz 160 lb 157 lb  HEIGHT 5\' 3"  5\' 3"  5\' 3"   BMI 27.46 kg/m2 28.34 kg/m2 27.81 kg/m2      Prediabetes Patient educated about the importance of limiting  Carbohydrate intake , the need to commit to  daily physical activity for a minimum of 30 minutes , and to commit weight loss. The fact that changes in all these areas will reduce or eliminate all together the development of diabetes is stressed.   Diabetic Labs Latest Ref Rng & Units 02/04/2016 04/24/2015 09/03/2014 06/11/2014 05/11/2013  HbA1c <5.7 % - 5.7(H) - 5.5 -  Chol 0 - 200 mg/dL - - - 210(H) 184  HDL >=46 mg/dL - - - 57 51  Calc LDL 0 - 99 mg/dL - - - 127(H) 118(H)  Triglycerides  <150 mg/dL - - - 132 77  Creatinine 0.44 - 1.00 mg/dL 0.91 0.88 0.94 0.81 0.90   BP/Weight 04/21/2016 02/04/2016 12/04/2015 07/02/2015 04/17/2015 09/03/2014 [id number]  Systolic BP 123456 123456 0000000 [id number] 0000000 [id number] 0000000  Diastolic BP 80 76 82 80 78 80 82  Wt. (Lbs) 155.04 160 157 163 166 165.08 168.04  BMI 27.46 28.34 27.81 28.88 29.41 29.25 29.77   No flowsheet data found. Updated lab needed at/ before next visit.   Depression Improved and controlled on current med, continue same  GAD (generalized anxiety disorder) Improved, decrease dose of xanax, and start buspar

## 2016-04-21 NOTE — Assessment & Plan Note (Signed)
Controlled, no change in medication DASH diet and commitment to daily physical activity for a minimum of 30 minutes discussed and encouraged, as a part of hypertension management. The importance of attaining a healthy weight is also discussed.  BP/Weight 04/21/2016 02/04/2016 12/04/2015 07/02/2015 04/17/2015 09/03/2014 [id number]  Systolic BP 123456 123456 0000000 [id number] 0000000 [id number] 0000000  Diastolic BP 80 76 82 80 78 80 82  Wt. (Lbs) 155.04 160 157 163 166 165.08 168.04  BMI 27.46 28.34 27.81 28.88 29.41 29.25 29.77

## 2016-04-21 NOTE — Assessment & Plan Note (Signed)
Patient educated about the importance of limiting  Carbohydrate intake , the need to commit to daily physical activity for a minimum of 30 minutes , and to commit weight loss. The fact that changes in all these areas will reduce or eliminate all together the development of diabetes is stressed.   Diabetic Labs Latest Ref Rng & Units 02/04/2016 04/24/2015 09/03/2014 06/11/2014 05/11/2013  HbA1c <5.7 % - 5.7(H) - 5.5 -  Chol 0 - 200 mg/dL - - - 210(H) 184  HDL >=46 mg/dL - - - 57 51  Calc LDL 0 - 99 mg/dL - - - 127(H) 118(H)  Triglycerides <150 mg/dL - - - 132 77  Creatinine 0.44 - 1.00 mg/dL 0.91 0.88 0.94 0.81 0.90   BP/Weight 04/21/2016 02/04/2016 12/04/2015 07/02/2015 04/17/2015 09/03/2014 [id number]  Systolic BP 123456 123456 0000000 [id number] 0000000 [id number] 0000000  Diastolic BP 80 76 82 80 78 80 82  Wt. (Lbs) 155.04 160 157 163 166 165.08 168.04  BMI 27.46 28.34 27.81 28.88 29.41 29.25 29.77   No flowsheet data found. Updated lab needed at/ before next visit.

## 2016-04-21 NOTE — Patient Instructions (Addendum)
Physical exam in 4 .5 month, call if you need me sooner  Mammogram due   Reduce xanax to one daily, start new for anxiety buspar one twice daily  Congrats on weight loss, keep it up!   Cut back on cigarettes, start at 9 per day, then down to 8 per day in next 2 weeks, then continue to cut back every 2 weeks   Fasting lipid, TSH, chem 7  HBa1C in 4.5 month  It is important that you exercise regularly at least 30 minutes 5 times a week. If you develop chest pain, have severe difficulty breathing, or feel very tired, stop exercising immediately and seek medical attention    Please work on good  health habits so that your health will improve. 1. Commitment to daily physical activity for 30 to 60  minutes, if you are able to do this.  2. Commitment to wise food choices. Aim for half of your  food intake to be vegetable and fruit, one quarter starchy foods, and one quarter protein. Try to eat on a regular schedule  3 meals per day, snacking between meals should be limited to vegetables or fruits or small portions of nuts. 64 ounces of water per day is generally recommended, unless you have specific health conditions, like heart failure or kidney failure where you will need to limit fluid intake.  3. Commitment to sufficient and a  good quality of physical and mental rest daily, generally between 6 to 8 hours per day.  WITH PERSISTANCE AND PERSEVERANCE, THE IMPOSSIBLE , BECOMES THE NORM!    Steps to Quit Smoking Smoking tobacco can be bad for your health. It can also affect almost every organ in your body. Smoking puts you and people around you at risk for many serious long-lasting (chronic) diseases. Quitting smoking is hard, but it is one of the best things that you can do for your health. It is never too late to quit. What are the benefits of quitting smoking? When you quit smoking, you lower your risk for getting serious diseases and conditions. They can include:  Lung cancer or lung  disease.  Heart disease.  Stroke.  Heart attack.  Not being able to have children (infertility).  Weak bones (osteoporosis) and broken bones (fractures). If you have coughing, wheezing, and shortness of breath, those symptoms may get better when you quit. You may also get sick less often. If you are pregnant, quitting smoking can help to lower your chances of having a baby of low birth weight. What can I do to help me quit smoking? Talk with your doctor about what can help you quit smoking. Some things you can do (strategies) include:  Quitting smoking totally, instead of slowly cutting back how much you smoke over a period of time.  Going to in-person counseling. You are more likely to quit if you go to many counseling sessions.  Using resources and support systems, such as:  Online chats with a Social worker.  Phone quitlines.  Printed Furniture conservator/restorer.  Support groups or group counseling.  Text messaging programs.  Mobile phone apps or applications.  Taking medicines. Some of these medicines may have nicotine in them. If you are pregnant or breastfeeding, do not take any medicines to quit smoking unless your doctor says it is okay. Talk with your doctor about counseling or other things that can help you. Talk with your doctor about using more than one strategy at the same time, such as taking medicines while you  are also going to in-person counseling. This can help make quitting easier. What things can I do to make it easier to quit? Quitting smoking might feel very hard at first, but there is a lot that you can do to make it easier. Take these steps:  Talk to your family and friends. Ask them to support and encourage you.  Call phone quitlines, reach out to support groups, or work with a Social worker.  Ask people who smoke to not smoke around you.  Avoid places that make you want (trigger) to smoke, such as:  Bars.  Parties.  Smoke-break areas at work.  Spend time  with people who do not smoke.  Lower the stress in your life. Stress can make you want to smoke. Try these things to help your stress:  Getting regular exercise.  Deep-breathing exercises.  Yoga.  Meditating.  Doing a body scan. To do this, close your eyes, focus on one area of your body at a time from head to toe, and notice which parts of your body are tense. Try to relax the muscles in those areas.  Download or buy apps on your mobile phone or tablet that can help you stick to your quit plan. There are many free apps, such as QuitGuide from the [employer] Office manager for Disease Control and Prevention). You can find more support from smokefree.gov and other websites. This information is not intended to replace advice given to you by your health care provider. Make sure you discuss any questions you have with your health care provider. Document Released: 12/05/2008 Document Revised: 10/07/2015 Document Reviewed: 06/25/2014 Elsevier Interactive Patient Education  2017 [employer].

## 2016-04-21 NOTE — Assessment & Plan Note (Signed)
Improved Patient re-educated about  the importance of commitment to a  minimum of 150 minutes of exercise per week.  The importance of healthy food choices with portion control discussed. Encouraged to start a food diary, count calories and to consider  joining a support group. Sample diet sheets offered. Goals set by the patient for the next several months.   Weight /BMI 04/21/2016 02/04/2016 12/04/2015  WEIGHT 155 lb 0.6 oz 160 lb 157 lb  HEIGHT 5\' 3"  5\' 3"  5\' 3"   BMI 27.46 kg/m2 28.34 kg/m2 27.81 kg/m2

## 2016-04-21 NOTE — Assessment & Plan Note (Signed)
Improved, decrease dose of xanax, and start buspar

## 2016-04-21 NOTE — Assessment & Plan Note (Signed)
Improved and controlled on current med , continue same 

## 2016-04-26 ENCOUNTER — Ambulatory Visit (HOSPITAL_COMMUNITY): Payer: BLUE CROSS/BLUE SHIELD

## 2016-05-10 ENCOUNTER — Ambulatory Visit (HOSPITAL_COMMUNITY)
Admission: RE | Admit: 2016-05-10 | Discharge: 2016-05-10 | Disposition: A | Discharge status: Home / Self Care | Payer: BLUE CROSS/BLUE SHIELD | Source: Ambulatory Visit | Attending: Family Medicine | Admitting: Family Medicine

## 2016-05-10 ENCOUNTER — Other Ambulatory Visit: Payer: Self-pay | Admitting: Family Medicine

## 2016-05-10 DIAGNOSIS — Z1231 Encounter for screening mammogram for malignant neoplasm of breast: Secondary | ICD-10-CM | POA: Diagnosis not present

## 2016-05-10 DIAGNOSIS — F32A Depression, unspecified: Secondary | ICD-10-CM

## 2016-05-10 DIAGNOSIS — F329 Major depressive disorder, single episode, unspecified: Secondary | ICD-10-CM

## 2016-05-10 DIAGNOSIS — Z1239 Encounter for other screening for malignant neoplasm of breast: Secondary | ICD-10-CM

## 2016-05-31 ENCOUNTER — Emergency Department (HOSPITAL_COMMUNITY)
Admission: EM | Admit: 2016-05-31 | Discharge: 2016-05-31 | Disposition: A | Discharge status: Home / Self Care | Payer: BLUE CROSS/BLUE SHIELD | Attending: Emergency Medicine | Admitting: Emergency Medicine

## 2016-05-31 ENCOUNTER — Encounter (HOSPITAL_COMMUNITY): Payer: Self-pay | Admitting: *Deleted

## 2016-05-31 DIAGNOSIS — M25512 Pain in left shoulder: Secondary | ICD-10-CM | POA: Diagnosis not present

## 2016-05-31 DIAGNOSIS — I10 Essential (primary) hypertension: Secondary | ICD-10-CM | POA: Diagnosis not present

## 2016-05-31 DIAGNOSIS — Z79899 Other long term (current) drug therapy: Secondary | ICD-10-CM | POA: Insufficient documentation

## 2016-05-31 DIAGNOSIS — F17200 Nicotine dependence, unspecified, uncomplicated: Secondary | ICD-10-CM | POA: Insufficient documentation

## 2016-05-31 MED ORDER — IBUPROFEN 400 MG PO TABS
600.0000 mg | ORAL_TABLET | Freq: Once | ORAL | Status: AC
Start: 2016-05-31 — End: 2016-05-31
  Administered 2016-05-31: 600 mg via ORAL
  Filled 2016-05-31: qty 2

## 2016-05-31 MED ORDER — IBUPROFEN 600 MG PO TABS: 600.0000 mg | ORAL_TABLET | Freq: Four times a day (QID) | ORAL | 0 refills | Status: DC | PRN

## 2016-05-31 NOTE — Discharge Instructions (Signed)
Rest - use sling during the day. Take it off a couple times a day to do range of motion exercises so your shoulder does not get stiff. You can take it off for sleep as well. Ice - ice for 20 minutes at a time, several times a day Ibuprofen - take with food. Take up to 3-4 times daily Take flexeril for muscle spasms as needed Follow up with your PCP

## 2016-05-31 NOTE — ED Provider Notes (Signed)
[facility] DEPT Provider Note   CSN: [id number] Arrival date & time: 05/31/16  0815     History   Chief Complaint Chief Complaint  Patient presents with  . Arm Pain    HPI Catherine Mclean is a 56 y.o. female who presents with left arm pain. 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 She has had the pain on and off for the past couple months but it has been minimal. Worse with reaching upwards. She denies recent injury and is no longer lifting. Today she woke up with severe pain in the arm with movement. She tried to make an appointment with her PCP but they could not see her today. No chest pain, SOB, numbness, or tingling.  HPI  Past Medical History:  Diagnosis Date  . Depression   . Hypertension     Patient Active Problem List   Diagnosis Date Noted  . Prediabetes 07/06/2015  . Overweight (BMI 25.0-29.9) 07/06/2015  . Spasm of muscle 09/03/2014  . Depression 12/24/2013  . Medical non-compliance 05/06/2013  . GAD (generalized anxiety disorder) 05/03/2013  . Insomnia 05/03/2013  . ANEMIA, HX OF 11/20/2008  . Hyperlipemia 01/29/2008  . NICOTINE ADDICTION 10/26/2007  . Essential hypertension 05/12/2007    Past Surgical History:  Procedure Laterality Date  . OVARY SURGERY      OB History    No data available       Home Medications    Prior to Admission medications   Medication Sig Start Date End Date Taking? Authorizing Provider  ALPRAZolam Duanne Moron) 0.5 MG tablet One tablet once daily as needed 04/21/16   Fayrene Helper, MD  bisoprolol-hydrochlorothiazide [facility]) 5-6.25 MG tablet TAKE 1 TABLET BY MOUTH DAILY 10/07/15   Fayrene Helper, MD  busPIRone (BUSPAR) 5 MG tablet Take 1 tablet (5 mg total) by mouth 2 (two) times daily. 04/21/16   Fayrene Helper, MD  cyclobenzaprine (FLEXERIL) 10 MG tablet TAKE 1 TABLET BY MOUTH THREE TIMES  DAILY AS NEEDED FOR MUSCLES SPASMS 12/09/15   Fayrene Helper, MD  traZODone (DESYREL) 100 MG tablet TAKE 1 TABLET BY MOUTH EVERY NIGHT AT BEDTIME 05/10/16   Fayrene Helper, MD  zolpidem (AMBIEN) 5 MG tablet TAKE 1 TABLET BY MOUTH EVERY DAY AT BEDTIME FOR SLEEP 04/14/16   Fayrene Helper, MD    Family History Family History  Problem Relation Age of Onset  . Hypertension Sister   . Hypertension Sister   . Hyperlipidemia Sister   . Cancer Sister   . Arthritis/Rheumatoid Sister     Social History Social History  Substance Use Topics  . Smoking status: Current Every Day Smoker    Packs/day: 0.50  . Smokeless tobacco: Never Used  . Alcohol use Yes     Comment: occ     Allergies   Hydrochlorothiazide w-triamterene   Review of Systems Review of Systems  Respiratory: Negative for shortness of breath.   Cardiovascular: Negative for chest pain.  Musculoskeletal: Positive for arthralgias and myalgias. Negative for joint swelling.  Neurological: Negative for numbness.     Physical Exam Updated Vital Signs BP 139/73   Pulse (!) 43   Resp 18   Ht 5\' 2"  (1.575 m)   Wt 66.2 kg   LMP 05/10/2016   SpO2 100%   BMI 26.70 kg/m   Physical Exam  Constitutional:  She is oriented to person, place, and time. She appears well-developed and well-nourished. No distress.  HENT:  Head: Normocephalic and atraumatic.  Eyes: Conjunctivae are normal. Pupils are equal, round, and reactive to light. Right eye exhibits no discharge. Left eye exhibits no discharge. No scleral icterus.  Neck: Normal range of motion.  Cardiovascular: Normal rate.   Pulmonary/Chest: Effort normal. No respiratory distress.  Abdominal: She exhibits no distension.  Musculoskeletal:  Left shoulder: No obvious swelling or deformity. Tenderness to palpation over the posterior and lateral shoulder Decreased ROM due to pain with abduction. She is able to fully flex and extend her shoulder with minimal pain. N/V  intact.   Neurological: She is alert and oriented to person, place, and time.  Skin: Skin is warm and dry.  Psychiatric: She has a normal mood and affect. Her behavior is normal.  Nursing note and vitals reviewed.    ED Treatments / Results  Labs (all labs ordered are listed, but only abnormal results are displayed) Labs Reviewed - No data to display  EKG  EKG Interpretation None       Radiology No results found.  Procedures Procedures (including critical care time)  Medications Ordered in ED Medications  ibuprofen (ADVIL,MOTRIN) tablet 600 mg (600 mg Oral Given 05/31/16 1610)     Initial Impression / Assessment and Plan / ED Course  I have reviewed the triage vital signs and the nursing notes.  Pertinent labs & imaging results that were available during my care of the patient were reviewed by me and considered in my medical decision making (see chart for details).  56 year old female with rotator cuff tendonitis. She is bradycardic - likely from her beta blocker. She is also mildly hypertensive. Will give sling in ED and pain control. I don't think xray will be very helpful with no acute injury. She already has rx for muscle relaxers. Advised NSAIDs and follow up with PCP. She verbalized understanding. Return precautions given.  Final Clinical Impressions(s) / ED Diagnoses   Final diagnoses:  Acute pain of left shoulder    New Prescriptions New Prescriptions   No medications on file     Recardo Evangelist, PA-C 05/31/16 [id number]    Elnora Morrison, MD 06/01/16 [phone]

## 2016-05-31 NOTE — ED Triage Notes (Addendum)
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 Pt reports she can move the arm, but it causes severe pain. Pt has had this issue before and thought it was her rotator cuff. Denies chest pain, SOB.

## 2016-06-08 ENCOUNTER — Other Ambulatory Visit: Payer: Self-pay | Admitting: Family Medicine

## 2016-06-12 ENCOUNTER — Other Ambulatory Visit: Payer: Self-pay | Admitting: Family Medicine

## 2016-06-27 ENCOUNTER — Other Ambulatory Visit: Payer: Self-pay | Admitting: Family Medicine

## 2016-07-21 ENCOUNTER — Other Ambulatory Visit: Payer: Self-pay | Admitting: Family Medicine

## 2016-08-25 ENCOUNTER — Other Ambulatory Visit: Payer: Self-pay | Admitting: Family Medicine

## 2016-08-30 DIAGNOSIS — R7303 Prediabetes: Secondary | ICD-10-CM | POA: Diagnosis not present

## 2016-08-30 DIAGNOSIS — E782 Mixed hyperlipidemia: Secondary | ICD-10-CM | POA: Diagnosis not present

## 2016-08-30 DIAGNOSIS — I10 Essential (primary) hypertension: Secondary | ICD-10-CM | POA: Diagnosis not present

## 2016-08-30 LAB — BASIC METABOLIC PANEL
BUN: 12 mg/dL (ref 7–25)
CO2: 25 mmol/L (ref 20–31)
Calcium: 9.5 mg/dL (ref 8.6–10.4)
Chloride: 106 mmol/L (ref 98–110)
Creat: 0.92 mg/dL (ref 0.50–1.05)
Glucose, Bld: 92 mg/dL (ref 65–99)
Potassium: 3.6 mmol/L (ref 3.5–5.3)
Sodium: 139 mmol/L (ref 135–146)

## 2016-08-30 LAB — LIPID PANEL
Cholesterol: 206 mg/dL — ABNORMAL HIGH (ref <200)
HDL: 52 mg/dL (ref >50)
LDL Cholesterol: 137 mg/dL — ABNORMAL HIGH (ref <100)
Total CHOL/HDL Ratio: 4 Ratio (ref <5.0)
Triglycerides: 87 mg/dL (ref <150)
VLDL: 17 mg/dL (ref <30)

## 2016-08-30 LAB — TSH: TSH: 1.12 mIU/L

## 2016-08-31 LAB — HEMOGLOBIN A1C
Hgb A1c MFr Bld: 5.4 % (ref <5.7)
Mean Plasma Glucose: 108 mg/dL

## 2016-09-01 ENCOUNTER — Encounter: Payer: BLUE CROSS/BLUE SHIELD | Admitting: Family Medicine

## 2016-09-14 ENCOUNTER — Encounter: Payer: Self-pay | Admitting: Family Medicine

## 2016-09-14 ENCOUNTER — Ambulatory Visit (INDEPENDENT_AMBULATORY_CARE_PROVIDER_SITE_OTHER): Payer: BLUE CROSS/BLUE SHIELD | Admitting: Family Medicine

## 2016-09-14 VITALS — BP 130/70 | HR 52 | Temp 97.8°F | Resp 16 | Ht 62.0 in | Wt 150.8 lb

## 2016-09-14 DIAGNOSIS — M7552 Bursitis of left shoulder: Secondary | ICD-10-CM

## 2016-09-14 DIAGNOSIS — Z0000 Encounter for general adult medical examination without abnormal findings: Secondary | ICD-10-CM

## 2016-09-14 DIAGNOSIS — Z1211 Encounter for screening for malignant neoplasm of colon: Secondary | ICD-10-CM

## 2016-09-14 DIAGNOSIS — F411 Generalized anxiety disorder: Secondary | ICD-10-CM

## 2016-09-14 DIAGNOSIS — F17200 Nicotine dependence, unspecified, uncomplicated: Secondary | ICD-10-CM

## 2016-09-14 LAB — HEMOCCULT GUIAC POC 1CARD (OFFICE): Fecal Occult Blood, POC: NEGATIVE

## 2016-09-14 MED ORDER — PREDNISONE 5 MG (21) PO TBPK: 5.0000 mg | ORAL_TABLET | ORAL | 0 refills | Status: DC

## 2016-09-14 MED ORDER — IBUPROFEN 800 MG PO TABS
800.0000 mg | ORAL_TABLET | Freq: Three times a day (TID) | ORAL | 0 refills | Status: DC
Start: 2016-09-14 — End: 2017-03-28

## 2016-09-14 MED ORDER — ALPRAZOLAM 0.5 MG PO TBDP: ORAL_TABLET | ORAL | 0 refills | Status: DC

## 2016-09-14 MED ORDER — BUSPIRONE HCL 7.5 MG PO TABS: 7.5000 mg | ORAL_TABLET | Freq: Three times a day (TID) | ORAL | 3 refills | Status: DC

## 2016-09-14 NOTE — Patient Instructions (Addendum)
F/u in 4 month, call if you need me sooner  1 week of medication sent for left shoulder pain due to bursitis, if persists , call for referral to orthopedics  Congrats on normal blood sugar  Dose increase in buspar.  Pls continue to cut back on smoking need to quit  Xanax restricted to at most 3 per week, goal is to quit needing them  It is important that you exercise regularly at least 30 minutes 5 times a week. If you develop chest pain, have severe difficulty breathing, or feel very tired, stop exercising immediately and seek medical attention    Still need colonoscopy

## 2016-09-14 NOTE — Assessment & Plan Note (Signed)
Patient is asked and  confirms current  Nicotine use.  Five to seven minutes of time is spent in counseling the patient of the need to quit smoking  Advice to quit is delivered clearly specifically in reducing the risk of developing heart disease, having a stroke, or of developing all types of cancer, especially lung and oral cancer. Improvement in breathing and exercise tolerance and quality of life is also discussed, as is the economic benefit.  Assessment of willingness to quit or to make an attempt to quit is made and documented  Assistance in quit attempt is made with several and varied options presented, based on patient's desire and need. These include  literature, local classes available, 1800 QUIT NOW number, OTC and prescription medication.  The GOAL to be NICOTINE FREE is re emphasized.  The patient has set a personal goal of either reduction or discontinuation and follow up is arranged between 6 an 16 weeks.  Current is 8/day

## 2016-09-14 NOTE — Assessment & Plan Note (Signed)

## 2016-09-19 ENCOUNTER — Encounter: Payer: Self-pay | Admitting: Family Medicine

## 2016-09-19 NOTE — Assessment & Plan Note (Signed)
uncontrolled , increase dose of buspar and limit xanax  , with a view to discontinuation Behavioral modification discussed and encouraged also

## 2016-09-19 NOTE — Assessment & Plan Note (Signed)
Anti inflammatory course prescribed, for 1 week, if no response pt to be referred to ortho

## 2016-09-19 NOTE — Progress Notes (Signed)
Catherine Mclean     MRN: [id number]      DOB: 1960/07/29  HPI: Patient is in for annual physical exam. C/o left shoulder pain and swelling with reduced mobility, no direct trauma. C/o uncontrolled anxiety, understands and accepts the need to reduce xanax.Smoking varies with anxiety level and unwilling to set a quit date Recent labs,  are reviewed. Immunization is reviewed , and  updated if needed.   PE: BP 130/70 (BP Location: Left Arm, Patient Position: Sitting, Cuff Size: Normal)   Pulse (!) 52   Temp 97.8 F (36.6 C) (Other (Comment))   Resp 16   Ht 5\' 2"  (1.575 m)   Wt 150 lb 12 oz (68.4 kg)   SpO2 99%   BMI 27.57 kg/m   Pleasant  female, alert and oriented x 3, in no cardio-pulmonary distress. Afebrile. HEENT No facial trauma or asymetry. Sinuses non tender.  Extra occullar muscles intact, pupils equally reactive to light. External ears normal, tympanic membranes clear. Oropharynx moist, no exudate. Neck: supple, no adenopathy,JVD or thyromegaly.No bruits.  Chest: Clear to ascultation bilaterally.No crackles or wheezes. Non tender to palpation  Breast: No asymetry,no masses or lumps. No tenderness. No nipple discharge or inversion. No axillary or supraclavicular adenopathy  Cardiovascular system; Heart sounds normal,  S1 and  S2 ,no S3.  No murmur, or thrill. Apical beat not displaced Peripheral pulses normal.  Abdomen: Soft, non tender, no organomegaly or masses. No bruits. Bowel sounds normal. No guarding, tenderness or rebound.  Rectal:  Normal sphincter tone. No rectal mass. Guaiac negative stool.  GU: . Not examined  Musculoskeletal exam: Full ROM of spine, hips ,  and knees.Decreased ROM left shoulder with swelling  And tenderness. No muscle wasting or atrophy.   Neurologic: Cranial nerves 2 to 12 intact. Power, tone ,sensation and reflexes normal throughout. No disturbance in gait. No tremor.  Skin: Intact, no ulceration, erythema ,  scaling or rash noted. Pigmentation normal throughout  Psych; Normal mood and affect. Judgement and concentration normal   Assessment & Plan:  Annual physical exam Annual exam as documented. Counseling done  re healthy lifestyle involving commitment to 150 minutes exercise per week, heart healthy diet, and attaining healthy weight.The importance of adequate sleep also discussed. Regular seat belt use and home safety, is also discussed. Changes in health habits are decided on by the patient with goals and time frames  set for achieving them. Immunization and cancer screening needs are specifically addressed at this visit.   NICOTINE ADDICTION Patient is asked and  confirms current  Nicotine use.  Five to seven minutes of time is spent in counseling the patient of the need to quit smoking  Advice to quit is delivered clearly specifically in reducing the risk of developing heart disease, having a stroke, or of developing all types of cancer, especially lung and oral cancer. Improvement in breathing and exercise tolerance and quality of life is also discussed, as is the economic benefit.  Assessment of willingness to quit or to make an attempt to quit is made and documented  Assistance in quit attempt is made with several and varied options presented, based on patient's desire and need. These include  literature, local classes available, 1800 QUIT NOW number, OTC and prescription medication.  The GOAL to be NICOTINE FREE is re emphasized.  The patient has set a personal goal of either reduction or discontinuation and follow up is arranged between 6 an 16 weeks.  Current is 8/day

## 2016-10-05 ENCOUNTER — Other Ambulatory Visit: Payer: Self-pay | Admitting: Family Medicine

## 2016-10-06 ENCOUNTER — Other Ambulatory Visit: Payer: Self-pay | Admitting: Family Medicine

## 2016-10-28 ENCOUNTER — Other Ambulatory Visit: Payer: Self-pay | Admitting: Family Medicine

## 2016-10-28 DIAGNOSIS — F32A Depression, unspecified: Secondary | ICD-10-CM

## 2016-10-28 DIAGNOSIS — F329 Major depressive disorder, single episode, unspecified: Secondary | ICD-10-CM

## 2016-11-07 ENCOUNTER — Other Ambulatory Visit: Payer: Self-pay | Admitting: Family Medicine

## 2016-11-08 NOTE — Telephone Encounter (Signed)
Seen [phone]

## 2016-11-25 ENCOUNTER — Telehealth: Payer: Self-pay | Admitting: *Deleted

## 2016-11-25 NOTE — Telephone Encounter (Signed)
Patient called requesting FLU shot

## 2016-11-26 NOTE — Telephone Encounter (Signed)
Patient informed she could come in to get shot.

## 2016-11-30 ENCOUNTER — Ambulatory Visit (INDEPENDENT_AMBULATORY_CARE_PROVIDER_SITE_OTHER): Payer: BLUE CROSS/BLUE SHIELD

## 2016-11-30 DIAGNOSIS — Z23 Encounter for immunization: Secondary | ICD-10-CM | POA: Diagnosis not present

## 2016-12-23 ENCOUNTER — Other Ambulatory Visit: Payer: Self-pay | Admitting: Family Medicine

## 2017-01-10 ENCOUNTER — Other Ambulatory Visit: Payer: Self-pay | Admitting: Family Medicine

## 2017-01-10 DIAGNOSIS — F32A Depression, unspecified: Secondary | ICD-10-CM

## 2017-01-10 DIAGNOSIS — F329 Major depressive disorder, single episode, unspecified: Secondary | ICD-10-CM

## 2017-01-10 NOTE — Telephone Encounter (Signed)
Seen [phone]

## 2017-01-24 ENCOUNTER — Ambulatory Visit: Payer: BLUE CROSS/BLUE SHIELD | Admitting: Family Medicine

## 2017-02-24 ENCOUNTER — Telehealth: Payer: Self-pay | Admitting: Family Medicine

## 2017-02-24 ENCOUNTER — Ambulatory Visit: Payer: BLUE CROSS/BLUE SHIELD | Admitting: Family Medicine

## 2017-02-24 NOTE — Telephone Encounter (Signed)
Called patient back to r/s appt per voicemail.

## 2017-03-28 ENCOUNTER — Ambulatory Visit (HOSPITAL_COMMUNITY)
Admission: RE | Admit: 2017-03-28 | Discharge: 2017-03-28 | Disposition: A | Discharge status: Home / Self Care | Payer: BLUE CROSS/BLUE SHIELD | Source: Ambulatory Visit | Attending: Family Medicine | Admitting: Family Medicine

## 2017-03-28 ENCOUNTER — Ambulatory Visit (INDEPENDENT_AMBULATORY_CARE_PROVIDER_SITE_OTHER): Payer: BLUE CROSS/BLUE SHIELD | Admitting: Family Medicine

## 2017-03-28 ENCOUNTER — Encounter: Payer: Self-pay | Admitting: Family Medicine

## 2017-03-28 VITALS — BP 128/82 | HR 73 | Resp 16 | Ht 62.0 in | Wt 150.0 lb

## 2017-03-28 DIAGNOSIS — F5101 Primary insomnia: Secondary | ICD-10-CM | POA: Diagnosis not present

## 2017-03-28 DIAGNOSIS — R7303 Prediabetes: Secondary | ICD-10-CM

## 2017-03-28 DIAGNOSIS — G8929 Other chronic pain: Secondary | ICD-10-CM | POA: Diagnosis not present

## 2017-03-28 DIAGNOSIS — M25552 Pain in left hip: Secondary | ICD-10-CM

## 2017-03-28 DIAGNOSIS — I10 Essential (primary) hypertension: Secondary | ICD-10-CM

## 2017-03-28 DIAGNOSIS — M62838 Other muscle spasm: Secondary | ICD-10-CM | POA: Diagnosis not present

## 2017-03-28 DIAGNOSIS — Z1231 Encounter for screening mammogram for malignant neoplasm of breast: Secondary | ICD-10-CM

## 2017-03-28 DIAGNOSIS — M7552 Bursitis of left shoulder: Secondary | ICD-10-CM

## 2017-03-28 MED ORDER — TRAZODONE HCL 100 MG PO TABS: 100.0000 mg | ORAL_TABLET | Freq: Every day | ORAL | 1 refills | Status: DC

## 2017-03-28 MED ORDER — CYCLOBENZAPRINE HCL 10 MG PO TABS: ORAL_TABLET | ORAL | 0 refills | Status: DC

## 2017-03-28 MED ORDER — BISOPROLOL-HYDROCHLOROTHIAZIDE 5-6.25 MG PO TABS: 1.0000 | ORAL_TABLET | Freq: Every day | ORAL | 1 refills | Status: DC

## 2017-03-28 MED ORDER — PREDNISONE 20 MG PO TABS: ORAL_TABLET | ORAL | 0 refills | Status: DC

## 2017-03-28 MED ORDER — IBUPROFEN 800 MG PO TABS
800.0000 mg | ORAL_TABLET | Freq: Three times a day (TID) | ORAL | 0 refills | Status: DC
Start: 2017-03-28 — End: 2017-09-26

## 2017-03-28 NOTE — Progress Notes (Signed)
   Catherine Mclean     MRN: [id number]      DOB: October 06, 1960   HPI Catherine Mclean is here for follow up and re-evaluation of chronic medical conditions, medication management and review of any available recent lab and radiology data.  Preventive health is updated, specifically  Cancer screening and Immunization.    The PT denies any adverse reactions to current medications since the last visit.  Left shoulder pain and swelling with reduced mobility x 6 weeks. Left hip pain with limping x 6 weeks Quit smoking in anticipation of her first grandbaby in April!!! ROS Denies recent fever or chills. Denies sinus pressure, nasal congestion, ear pain or sore throat. Denies chest congestion, productive cough or wheezing. Denies chest pains, palpitations and leg swelling Denies abdominal pain, nausea, vomiting,diarrhea or constipation.   Denies dysuria, frequency, hesitancy or incontinence.  Denies headaches, seizures, numbness, or tingling. Denies depression, anxiety or insomnia. Denies skin break down or rash.   PE  BP 128/82   Pulse 73   Resp 16   Ht 5\' 2"  (1.575 m)   Wt 150 lb (68 kg)   SpO2 98%   BMI 27.44 kg/m   Patient alert and oriented and in no cardiopulmonary distress.  HEENT: No facial asymmetry, EOMI,   oropharynx pink and moist.  Neck supple no JVD, no mass.  Chest: Clear to auscultation bilaterally.  CVS: S1, S2 no murmurs, no S3.Regular rate.  ABD: Soft non tender.   Ext: No edema  MS: Adequate though reduced  ROM lumbar  spine,  Decreased ROM right shoulder, adequate ROM hips and knees.  Skin: Intact, no ulcerations or rash noted.  Psych: Good eye contact, normal affect. Memory intact not anxious or depressed appearing.  CNS: CN 2-12 intact, power,  normal throughout.no focal deficits noted.   Assessment & Plan  Acute shoulder bursitis, left Short sharp course o oral anti inflammatories. Pt to call back if persists for referral to orthopedics  Essential  hypertension Controlled, no change in medication DASH diet and commitment to daily physical activity for a minimum of 30 minutes discussed and encouraged, as a part of hypertension management. The importance of attaining a healthy weight is also discussed.  BP/Weight 03/28/2017 09/14/2016 05/31/2016 04/21/2016 02/04/2016 12/04/2015 4/48/1856  Systolic BP 314 970 263 785 885 027 741  Diastolic BP 82 70 73 80 76 82 80  Wt. (Lbs) 150 150.75 146 155.04 160 157 163  BMI 27.44 27.57 26.7 27.46 28.34 27.81 28.88       Insomnia Sleep hygiene reviewed and written information offered also. Prescription sent for  medication needed.   Spasm of muscle Intermittent back spasm continue use of muscle relaxants as needed  Chronic hip pain, left shoprt antio inflammatory course and X ray of hip. Clinical impression from exam is that the problem is from the spine and not the hip

## 2017-03-28 NOTE — Patient Instructions (Addendum)
Physical exam end July, call if you need me before  We will sched mammogram due March 20 or after and give you appt info  Fasting CBC, lipid, cmp and EGFr, TSH and vit D end June  Medication sent for left shoulder and hip pain if pain persists , call for ortho referral Please get X ray of left hip today Congrats on stopping smoking, welcome baby Costner!!!  Thank you  for choosing [facility]. We consider it a privelige to serve you.  Delivering excellent health care in a caring and  compassionate way is our goal.  Partnering with you,  so that together we can achieve this goal is our strategy.

## 2017-03-31 ENCOUNTER — Encounter: Payer: Self-pay | Admitting: Family Medicine

## 2017-04-03 ENCOUNTER — Encounter: Payer: Self-pay | Admitting: Family Medicine

## 2017-04-03 DIAGNOSIS — G8929 Other chronic pain: Secondary | ICD-10-CM | POA: Insufficient documentation

## 2017-04-03 DIAGNOSIS — M25552 Pain in left hip: Secondary | ICD-10-CM

## 2017-04-03 NOTE — Assessment & Plan Note (Signed)
shoprt antio inflammatory course and X ray of hip. Clinical impression from exam is that the problem is from the spine and not the hip

## 2017-04-03 NOTE — Assessment & Plan Note (Signed)
Intermittent back spasm continue use of muscle relaxants as needed

## 2017-04-03 NOTE — Assessment & Plan Note (Signed)
Controlled, no change in medication DASH diet and commitment to daily physical activity for a minimum of 30 minutes discussed and encouraged, as a part of hypertension management. The importance of attaining a healthy weight is also discussed.  BP/Weight 03/28/2017 09/14/2016 05/31/2016 04/21/2016 02/04/2016 12/04/2015 9/97/7414  Systolic BP 239 532 023 343 568 616 837  Diastolic BP 82 70 73 80 76 82 80  Wt. (Lbs) 150 150.75 146 155.04 160 157 163  BMI 27.44 27.57 26.7 27.46 28.34 27.81 28.88

## 2017-04-03 NOTE — Assessment & Plan Note (Signed)
Short sharp course o oral anti inflammatories. Pt to call back if persists for referral to orthopedics

## 2017-04-03 NOTE — Assessment & Plan Note (Signed)
Sleep hygiene reviewed and written information offered also. Prescription sent for  medication needed.  

## 2017-04-08 ENCOUNTER — Other Ambulatory Visit: Payer: Self-pay | Admitting: Family Medicine

## 2017-04-08 DIAGNOSIS — F32A Depression, unspecified: Secondary | ICD-10-CM

## 2017-04-08 DIAGNOSIS — F329 Major depressive disorder, single episode, unspecified: Secondary | ICD-10-CM

## 2017-09-22 ENCOUNTER — Other Ambulatory Visit: Payer: Self-pay | Admitting: Family Medicine

## 2017-09-22 DIAGNOSIS — F5101 Primary insomnia: Secondary | ICD-10-CM

## 2017-09-26 ENCOUNTER — Encounter: Payer: Self-pay | Admitting: Family Medicine

## 2017-09-26 ENCOUNTER — Ambulatory Visit (INDEPENDENT_AMBULATORY_CARE_PROVIDER_SITE_OTHER): Payer: BLUE CROSS/BLUE SHIELD | Admitting: Family Medicine

## 2017-09-26 ENCOUNTER — Other Ambulatory Visit (HOSPITAL_COMMUNITY)
Admission: RE | Admit: 2017-09-26 | Discharge: 2017-09-26 | Disposition: A | Discharge status: Home / Self Care | Payer: BLUE CROSS/BLUE SHIELD | Source: Ambulatory Visit | Attending: Family Medicine | Admitting: Family Medicine

## 2017-09-26 ENCOUNTER — Encounter (INDEPENDENT_AMBULATORY_CARE_PROVIDER_SITE_OTHER): Payer: Self-pay | Admitting: *Deleted

## 2017-09-26 ENCOUNTER — Other Ambulatory Visit: Payer: Self-pay

## 2017-09-26 VITALS — BP 120/68 | HR 64 | Resp 12 | Ht 62.0 in | Wt 151.1 lb

## 2017-09-26 DIAGNOSIS — M2352 Chronic instability of knee, left knee: Secondary | ICD-10-CM | POA: Diagnosis not present

## 2017-09-26 DIAGNOSIS — E663 Overweight: Secondary | ICD-10-CM | POA: Diagnosis not present

## 2017-09-26 DIAGNOSIS — I10 Essential (primary) hypertension: Secondary | ICD-10-CM

## 2017-09-26 DIAGNOSIS — Z0000 Encounter for general adult medical examination without abnormal findings: Secondary | ICD-10-CM | POA: Diagnosis not present

## 2017-09-26 DIAGNOSIS — Z23 Encounter for immunization: Secondary | ICD-10-CM | POA: Diagnosis not present

## 2017-09-26 DIAGNOSIS — E782 Mixed hyperlipidemia: Secondary | ICD-10-CM

## 2017-09-26 DIAGNOSIS — Z1211 Encounter for screening for malignant neoplasm of colon: Secondary | ICD-10-CM

## 2017-09-26 DIAGNOSIS — E559 Vitamin D deficiency, unspecified: Secondary | ICD-10-CM | POA: Diagnosis not present

## 2017-09-26 DIAGNOSIS — R7303 Prediabetes: Secondary | ICD-10-CM

## 2017-09-26 DIAGNOSIS — H547 Unspecified visual loss: Secondary | ICD-10-CM

## 2017-09-26 DIAGNOSIS — F5101 Primary insomnia: Secondary | ICD-10-CM

## 2017-09-26 MED ORDER — TRAZODONE HCL 100 MG PO TABS: 100.0000 mg | ORAL_TABLET | Freq: Every day | ORAL | 3 refills | Status: DC

## 2017-09-26 MED ORDER — BISOPROLOL-HYDROCHLOROTHIAZIDE 5-6.25 MG PO TABS: 1.0000 | ORAL_TABLET | Freq: Every day | ORAL | 3 refills | Status: DC

## 2017-09-26 NOTE — Patient Instructions (Addendum)
Physical exam in 1 year, call if you need me sooner.  Please schedule mammogram at checkout  Please give her an appointment to return just for a shingles vaccine with the nurse in 3 months  Please come opn any Friday in October for your Flu vaccine, you do NOT need an appointment   Please wait for your lab order sheet and then get labs today  You are referred for colonoscopy wiith Dr Laural Golden, you will get a letter mailed to you from his office to call and schedule your appointment   I am referring you to orthopedic Dr of your choice re left knee

## 2017-09-26 NOTE — Assessment & Plan Note (Signed)
Controlled, no change in medication DASH diet and commitment to daily physical activity for a minimum of 30 minutes discussed and encouraged, as a part of hypertension management. The importance of attaining a healthy weight is also discussed.  BP/Weight 09/26/2017 03/28/2017 09/14/2016 05/31/2016 04/21/2016 02/04/2016 71/59/5396  Systolic BP 728 979 150 413 643 837 793  Diastolic BP 68 82 70 73 80 76 82  Wt. (Lbs) 151.12 150 150.75 146 155.04 160 157  BMI 27.64 27.44 27.57 26.7 27.46 28.34 27.81

## 2017-09-26 NOTE — Assessment & Plan Note (Signed)
unchanged. Patient re-educated about  the importance of commitment to a  minimum of 150 minutes of exercise per week.  The importance of healthy food choices with portion control discussed. Encouraged to start a food diary, count calories and to consider  joining a support group. Sample diet sheets offered. Goals set by the patient for the next several months.   Weight /BMI 09/26/2017 03/28/2017 09/14/2016  WEIGHT 151 lb 1.9 oz 150 lb 150 lb 12 oz  HEIGHT 5\' 2"  5\' 2"  5\' 2"   BMI 27.64 kg/m2 27.44 kg/m2 27.57 kg/m2

## 2017-09-26 NOTE — Assessment & Plan Note (Signed)
Shingrix # 1 administered by cMA after informed consent, no adverse local s/e

## 2017-09-26 NOTE — Assessment & Plan Note (Signed)

## 2017-09-26 NOTE — Assessment & Plan Note (Signed)
Recommend formal eye exam

## 2017-09-26 NOTE — Progress Notes (Signed)
Catherine Mclean     MRN: [id number]      DOB: 06-Sep-1960  HPI: Patient is in for annual physical exam. Chronic medical conditions are review and medications refilled for 1 year. C/o poor sleep, however no change in medication , but sleep hygiene is reviewed 2 month h/o worsening instability of left knee and pain Immunization is reviewed , and  updated .   PE: BP 120/68 (BP Location: Left Arm, Patient Position: Sitting, Cuff Size: Normal)   Pulse 64   Resp 12   Ht 5\' 2"  (1.575 m)   Wt 151 lb 1.9 oz (68.5 kg)   BMI 27.64 kg/m    Pleasant  female, alert and oriented x 3, in no cardio-pulmonary distress. Afebrile. HEENT No facial trauma or asymetry. Sinuses non tender.  Extra occullar muscles intact, pupils equally reactive to light. External ears normal, tympanic membranes clear. Oropharynx moist, no exudate. Neck: supple, no adenopathy,JVD or thyromegaly.No bruits.  Chest: Clear to ascultation bilaterally.No crackles or wheezes. Non tender to palpation  Breast: No asymetry,no masses or lumps. No tenderness. No nipple discharge or inversion. No axillary or supraclavicular adenopathy  Cardiovascular system; Heart sounds normal,  S1 and  S2 ,no S3.  No murmur, or thrill. Apical beat not displaced Peripheral pulses normal.  Abdomen: Soft, non tender, no organomegaly or masses. No bruits. Bowel sounds normal. No guarding, tenderness or rebound.  Rectal:  Normal sphincter tone. No rectal mass. Guaiac negative stool.  GU: External genitalia normal female genitalia , normal female distribution of hair. No lesions. Urethral meatus normal in size, no  Prolapse, no lesions visibly  Present. Bladder non tender. Vagina pink and moist , with no visible lesions , discharge present . Adequate pelvic support no  cystocele or rectocele noted Cervix pink and appears healthy, no lesions or ulcerations noted, no discharge noted from os Uterus normal size, no adnexal masses, no  cervical motion or adnexal tenderness.   Musculoskeletal exam: Full ROM of spine, hips , shoulders and knees. No deformity ,swelling or crepitus noted. No muscle wasting or atrophy.   Neurologic: Cranial nerves 2 to 12 intact. Power, tone ,sensation and reflexes normal throughout. No disturbance in gait. No tremor.  Skin: Intact, no ulceration, erythema , scaling or rash noted. Pigmentation normal throughout  Psych; Normal mood and affect. Judgement and concentration normal   Assessment & Plan:  Annual physical exam Annual exam as documented. Counseling done  re healthy lifestyle involving commitment to 150 minutes exercise per week, heart healthy diet, and attaining healthy weight.The importance of adequate sleep also discussed. Regular seat belt use and home safety, is also discussed. Changes in health habits are decided on by the patient with goals and time frames  set for achieving them. Immunization and cancer screening needs are specifically addressed at this visit.   Need for shingles vaccine Shingrix # 1 administered by cMA after informed consent, no adverse local s/e  Recurrent left knee instability C/o recurrent left knee instability, worse in the past 2 months. Will refer to ortho for eval and management  Overweight (BMI 25.0-29.9) unchanged. Patient re-educated about  the importance of commitment to a  minimum of 150 minutes of exercise per week.  The importance of healthy food choices with portion control discussed. Encouraged to start a food diary, count calories and to consider  joining a support group. Sample diet sheets offered. Goals set by the patient for the next several months.   Weight /BMI 09/26/2017 03/28/2017  09/14/2016  WEIGHT 151 lb 1.9 oz 150 lb 150 lb 12 oz  HEIGHT 5\' 2"  5\' 2"  5\' 2"   BMI 27.64 kg/m2 27.44 kg/m2 27.57 kg/m2      Reduced vision Recommend formal eye exam  Insomnia Sleep hygiene reviewed and written information offered  also. Prescription sent for  medication needed.   Essential hypertension Controlled, no change in medication DASH diet and commitment to daily physical activity for a minimum of 30 minutes discussed and encouraged, as a part of hypertension management. The importance of attaining a healthy weight is also discussed.  BP/Weight 09/26/2017 03/28/2017 09/14/2016 05/31/2016 04/21/2016 02/04/2016 72/55/0016  Systolic BP 429 037 955 831 674 255 258  Diastolic BP 68 82 70 73 80 76 82  Wt. (Lbs) 151.12 150 150.75 146 155.04 160 157  BMI 27.64 27.44 27.57 26.7 27.46 28.34 27.81

## 2017-09-26 NOTE — Assessment & Plan Note (Signed)
C/o recurrent left knee instability, worse in the past 2 months. Will refer to ortho for eval and management

## 2017-09-26 NOTE — Assessment & Plan Note (Signed)
Sleep hygiene reviewed and written information offered also. Prescription sent for  medication needed.  

## 2017-09-27 DIAGNOSIS — Z1211 Encounter for screening for malignant neoplasm of colon: Secondary | ICD-10-CM | POA: Diagnosis not present

## 2017-09-27 DIAGNOSIS — Z0000 Encounter for general adult medical examination without abnormal findings: Secondary | ICD-10-CM | POA: Diagnosis not present

## 2017-09-28 LAB — LIPID PANEL
Cholesterol: 230 mg/dL — ABNORMAL HIGH (ref <200)
HDL: 52 mg/dL (ref >50)
LDL Cholesterol (Calc): 159 mg/dL (calc) — ABNORMAL HIGH
Non-HDL Cholesterol (Calc): 178 mg/dL (calc) — ABNORMAL HIGH (ref <130)
Total CHOL/HDL Ratio: 4.4 (calc) (ref <5.0)
Triglycerides: 89 mg/dL (ref <150)

## 2017-09-28 LAB — CBC
HCT: 40.3 % (ref 35.0–45.0)
Hemoglobin: 13.2 g/dL (ref 11.7–15.5)
MCH: 27.6 pg (ref 27.0–33.0)
MCHC: 32.8 g/dL (ref 32.0–36.0)
MCV: 84.1 fL (ref 80.0–100.0)
MPV: 11.9 fL (ref 7.5–12.5)
Platelets: 171 10*3/uL (ref 140–400)
RBC: 4.79 10*6/uL (ref 3.80–5.10)
RDW: 14.1 % (ref 11.0–15.0)
WBC: 8.3 10*3/uL (ref 3.8–10.8)

## 2017-09-28 LAB — VITAMIN D 25 HYDROXY (VIT D DEFICIENCY, FRACTURES): Vit D, 25-Hydroxy: 27 ng/mL — ABNORMAL LOW (ref 30–100)

## 2017-09-28 LAB — CYTOLOGY - PAP
Diagnosis: NEGATIVE
HPV: NOT DETECTED

## 2017-09-28 LAB — COMPLETE METABOLIC PANEL WITH GFR
AG Ratio: 1.4 (calc) (ref 1.0–2.5)
ALT: 11 U/L (ref 6–29)
AST: 24 U/L (ref 10–35)
Albumin: 4.3 g/dL (ref 3.6–5.1)
Alkaline phosphatase (APISO): 89 U/L (ref 33–130)
BUN: 13 mg/dL (ref 7–25)
CO2: 29 mmol/L (ref 20–32)
Calcium: 9.6 mg/dL (ref 8.6–10.4)
Chloride: 106 mmol/L (ref 98–110)
Creat: 0.9 mg/dL (ref 0.50–1.05)
GFR, Est African American: 81 mL/min/{1.73_m2} (ref >=60)
GFR, Est Non African American: 70 mL/min/{1.73_m2} (ref >=60)
Globulin: 3.1 g/dL (calc) (ref 1.9–3.7)
Glucose, Bld: 96 mg/dL (ref 65–139)
Potassium: 3.7 mmol/L (ref 3.5–5.3)
Sodium: 140 mmol/L (ref 135–146)
Total Bilirubin: 0.3 mg/dL (ref 0.2–1.2)
Total Protein: 7.4 g/dL (ref 6.1–8.1)

## 2017-09-28 LAB — TSH: TSH: 0.99 mIU/L (ref 0.40–4.50)

## 2017-10-03 ENCOUNTER — Encounter (HOSPITAL_COMMUNITY): Payer: Self-pay

## 2017-10-03 ENCOUNTER — Ambulatory Visit (HOSPITAL_COMMUNITY)
Admission: RE | Admit: 2017-10-03 | Discharge: 2017-10-03 | Disposition: A | Discharge status: Home / Self Care | Payer: BLUE CROSS/BLUE SHIELD | Source: Ambulatory Visit | Attending: Family Medicine | Admitting: Family Medicine

## 2017-10-03 DIAGNOSIS — Z1231 Encounter for screening mammogram for malignant neoplasm of breast: Secondary | ICD-10-CM

## 2017-10-13 DIAGNOSIS — G8929 Other chronic pain: Secondary | ICD-10-CM | POA: Diagnosis not present

## 2017-10-13 DIAGNOSIS — M2352 Chronic instability of knee, left knee: Secondary | ICD-10-CM | POA: Diagnosis not present

## 2017-10-13 DIAGNOSIS — M25562 Pain in left knee: Secondary | ICD-10-CM | POA: Diagnosis not present

## 2017-10-17 ENCOUNTER — Other Ambulatory Visit (HOSPITAL_COMMUNITY): Payer: Self-pay | Admitting: Orthopedic Surgery

## 2017-10-17 DIAGNOSIS — M25362 Other instability, left knee: Secondary | ICD-10-CM

## 2017-10-19 ENCOUNTER — Other Ambulatory Visit (INDEPENDENT_AMBULATORY_CARE_PROVIDER_SITE_OTHER): Payer: Self-pay | Admitting: *Deleted

## 2017-10-19 DIAGNOSIS — Z09 Encounter for follow-up examination after completed treatment for conditions other than malignant neoplasm: Secondary | ICD-10-CM | POA: Insufficient documentation

## 2017-10-19 DIAGNOSIS — Z1211 Encounter for screening for malignant neoplasm of colon: Secondary | ICD-10-CM

## 2017-10-21 ENCOUNTER — Ambulatory Visit (HOSPITAL_COMMUNITY): Payer: BLUE CROSS/BLUE SHIELD

## 2017-10-27 ENCOUNTER — Ambulatory Visit (HOSPITAL_COMMUNITY)
Admission: RE | Admit: 2017-10-27 | Discharge: 2017-10-27 | Disposition: A | Discharge status: Home / Self Care | Payer: BLUE CROSS/BLUE SHIELD | Source: Ambulatory Visit | Attending: Orthopedic Surgery | Admitting: Orthopedic Surgery

## 2017-10-27 DIAGNOSIS — M1712 Unilateral primary osteoarthritis, left knee: Secondary | ICD-10-CM | POA: Insufficient documentation

## 2017-10-27 DIAGNOSIS — M25362 Other instability, left knee: Secondary | ICD-10-CM

## 2017-10-27 DIAGNOSIS — M25562 Pain in left knee: Secondary | ICD-10-CM | POA: Diagnosis not present

## 2017-11-10 ENCOUNTER — Other Ambulatory Visit: Payer: Self-pay | Admitting: Family Medicine

## 2017-11-17 ENCOUNTER — Ambulatory Visit (INDEPENDENT_AMBULATORY_CARE_PROVIDER_SITE_OTHER): Payer: BLUE CROSS/BLUE SHIELD

## 2017-11-17 DIAGNOSIS — Z23 Encounter for immunization: Secondary | ICD-10-CM

## 2017-11-18 DIAGNOSIS — M1712 Unilateral primary osteoarthritis, left knee: Secondary | ICD-10-CM | POA: Diagnosis not present

## 2017-11-18 DIAGNOSIS — G8929 Other chronic pain: Secondary | ICD-10-CM | POA: Diagnosis not present

## 2017-11-22 ENCOUNTER — Encounter (INDEPENDENT_AMBULATORY_CARE_PROVIDER_SITE_OTHER): Payer: Self-pay | Admitting: *Deleted

## 2017-11-22 ENCOUNTER — Telehealth (INDEPENDENT_AMBULATORY_CARE_PROVIDER_SITE_OTHER): Payer: Self-pay | Admitting: *Deleted

## 2017-11-22 NOTE — Telephone Encounter (Signed)
Patient needs suprep 

## 2017-11-23 MED ORDER — SUPREP BOWEL PREP KIT 17.5-3.13-1.6 GM/177ML PO SOLN: 1.0000 | Freq: Once | ORAL | 0 refills | Status: AC

## 2017-12-12 ENCOUNTER — Telehealth (INDEPENDENT_AMBULATORY_CARE_PROVIDER_SITE_OTHER): Payer: Self-pay | Admitting: *Deleted

## 2017-12-12 NOTE — Telephone Encounter (Signed)
agree

## 2017-12-12 NOTE — Telephone Encounter (Signed)
Referring MD/PCP: simpson   Procedure: tcs  Reason/Indication:  screening  Has patient had this procedure before?  no  If so, when, by whom and where?    Is there a family history of colon cancer?  no  Who?  What age when diagnosed?    Is patient diabetic?   no      Does patient have prosthetic heart valve or mechanical valve?  no  Do you have a pacemaker?  no  Has patient ever had endocarditis? no  Has patient had joint replacement within last 12 months?  no  Is patient constipated or do they take laxatives? no  Does patient have a history of alcohol/drug use?  no  Is patient on blood thinner such as Coumadin, Plavix and/or Aspirin? no  Medications: bisoprolol/hctz daily, trazadone 100 mg nightly  10/16/17 NO NEED FOR PROPOFOL PER DR REHMAN  Allergies: hctz/triam  Medication Adjustment per Dr Lindi Adie, NP:   Procedure date & time: 01/12/18 at 930

## 2017-12-22 ENCOUNTER — Other Ambulatory Visit: Payer: Self-pay | Admitting: Family Medicine

## 2017-12-22 DIAGNOSIS — F5101 Primary insomnia: Secondary | ICD-10-CM

## 2018-01-02 ENCOUNTER — Ambulatory Visit (INDEPENDENT_AMBULATORY_CARE_PROVIDER_SITE_OTHER): Payer: BLUE CROSS/BLUE SHIELD

## 2018-01-02 DIAGNOSIS — Z23 Encounter for immunization: Secondary | ICD-10-CM

## 2018-01-02 NOTE — Progress Notes (Signed)
Received shingrix in left deltoid #2

## 2018-01-12 ENCOUNTER — Encounter (HOSPITAL_COMMUNITY): Admission: RE | Payer: Self-pay | Source: Ambulatory Visit

## 2018-01-12 ENCOUNTER — Ambulatory Visit (HOSPITAL_COMMUNITY)
Admission: RE | Admit: 2018-01-12 | Payer: BLUE CROSS/BLUE SHIELD | Source: Ambulatory Visit | Admitting: Internal Medicine

## 2018-01-12 ENCOUNTER — Other Ambulatory Visit (INDEPENDENT_AMBULATORY_CARE_PROVIDER_SITE_OTHER): Payer: Self-pay | Admitting: *Deleted

## 2018-01-12 ENCOUNTER — Encounter (INDEPENDENT_AMBULATORY_CARE_PROVIDER_SITE_OTHER): Payer: Self-pay | Admitting: *Deleted

## 2018-01-12 DIAGNOSIS — Z1211 Encounter for screening for malignant neoplasm of colon: Secondary | ICD-10-CM

## 2018-01-12 SURGERY — COLONOSCOPY
Anesthesia: Moderate Sedation

## 2018-02-27 ENCOUNTER — Encounter (INDEPENDENT_AMBULATORY_CARE_PROVIDER_SITE_OTHER): Payer: Self-pay | Admitting: *Deleted

## 2018-02-27 NOTE — Telephone Encounter (Addendum)
Error

## 2018-02-27 NOTE — Telephone Encounter (Signed)
This encounter was created in error - please disregard.

## 2018-03-07 ENCOUNTER — Emergency Department (HOSPITAL_COMMUNITY)
Admission: EM | Admit: 2018-03-07 | Discharge: 2018-03-07 | Disposition: A | Discharge status: Home / Self Care | Payer: BLUE CROSS/BLUE SHIELD | Attending: Emergency Medicine | Admitting: Emergency Medicine

## 2018-03-07 ENCOUNTER — Encounter (HOSPITAL_COMMUNITY): Payer: Self-pay

## 2018-03-07 ENCOUNTER — Other Ambulatory Visit: Payer: Self-pay

## 2018-03-07 DIAGNOSIS — Z87891 Personal history of nicotine dependence: Secondary | ICD-10-CM | POA: Insufficient documentation

## 2018-03-07 DIAGNOSIS — Z79899 Other long term (current) drug therapy: Secondary | ICD-10-CM | POA: Diagnosis not present

## 2018-03-07 DIAGNOSIS — M542 Cervicalgia: Secondary | ICD-10-CM | POA: Diagnosis not present

## 2018-03-07 DIAGNOSIS — M5412 Radiculopathy, cervical region: Secondary | ICD-10-CM | POA: Diagnosis not present

## 2018-03-07 DIAGNOSIS — I10 Essential (primary) hypertension: Secondary | ICD-10-CM | POA: Insufficient documentation

## 2018-03-07 MED ORDER — PREDNISONE 50 MG PO TABS
60.0000 mg | ORAL_TABLET | Freq: Once | ORAL | Status: AC
  Administered 2018-03-07: 60 mg via ORAL
  Filled 2018-03-07: qty 1

## 2018-03-07 MED ORDER — CYCLOBENZAPRINE HCL 10 MG PO TABS
10.0000 mg | ORAL_TABLET | Freq: Once | ORAL | Status: AC
  Administered 2018-03-07: 10 mg via ORAL
  Filled 2018-03-07: qty 1

## 2018-03-07 MED ORDER — CYCLOBENZAPRINE HCL 10 MG PO TABS: 10.0000 mg | ORAL_TABLET | Freq: Three times a day (TID) | ORAL | 0 refills | Status: DC | PRN

## 2018-03-07 MED ORDER — PREDNISONE 20 MG PO TABS: ORAL_TABLET | ORAL | 0 refills | Status: DC

## 2018-03-07 MED ORDER — KETOROLAC TROMETHAMINE 30 MG/ML IJ SOLN
30.0000 mg | Freq: Once | INTRAMUSCULAR | Status: AC
  Administered 2018-03-07: 30 mg via INTRAMUSCULAR
  Filled 2018-03-07: qty 1

## 2018-03-07 MED ORDER — NAPROXEN 375 MG PO TABS: 375.0000 mg | ORAL_TABLET | Freq: Two times a day (BID) | ORAL | 0 refills | Status: DC

## 2018-03-07 NOTE — ED Provider Notes (Signed)
[facility] EMERGENCY DEPARTMENT Provider Note   CSN: [id number] Arrival date & time: 03/07/18  9233     History   Chief Complaint Chief Complaint  Patient presents with  . Neck Pain    HPI Catherine Mclean is a 58 y.o. female.  HPI  58 year old female presents with acute left-sided neck/back pain and radiation into her left arm.  Started a few days ago but has been much worse over the last 24 hours and today the pain was severe.  It radiates from her neck down to her trapezius and upper chest and into her left arm on the ulnar aspect.  Some tingling but no numbness and no weakness.  It is painful to move her arm.  She took some leftover muscle relaxers early in the course but has run out.  Has also tried some ibuprofen and heat.  No fevers.  No incontinence or leg weakness.  No trauma or shortness of breath.  Past Medical History:  Diagnosis Date  . Depression   . Hypertension     Patient Active Problem List   Diagnosis Date Noted  . Special screening for malignant neoplasms, colon 10/19/2017  . Need for shingles vaccine 09/26/2017  . Recurrent left knee instability 09/26/2017  . Reduced vision 09/26/2017  . Prediabetes 07/06/2015  . Overweight (BMI 25.0-29.9) 07/06/2015  . Spasm of muscle 09/03/2014  . Annual physical exam 06/12/2014  . Insomnia 05/03/2013  . ANEMIA, HX OF 11/20/2008  . Hyperlipemia 01/29/2008  . Essential hypertension 05/12/2007    Past Surgical History:  Procedure Laterality Date  . OVARY SURGERY       OB History   No obstetric history on file.      Home Medications    Prior to Admission medications   Medication Sig Start Date End Date Taking? Authorizing Provider  bisoprolol-hydrochlorothiazide (ZIAC) 5-6.25 MG tablet Take 1 tablet by mouth daily. 09/26/17   Fayrene Helper, MD  bisoprolol-hydrochlorothiazide [facility]) 5-6.25 MG tablet TAKE 1 TABLET BY MOUTH DAILY 11/11/17   Fayrene Helper, MD  cyclobenzaprine (FLEXERIL) 10 MG tablet  Take 1 tablet (10 mg total) by mouth 3 (three) times daily as needed for muscle spasms. 03/07/18   Sherwood Gambler, MD  naproxen (NAPROSYN) 375 MG tablet Take 1 tablet (375 mg total) by mouth 2 (two) times daily. 03/07/18   Sherwood Gambler, MD  predniSONE (DELTASONE) 20 MG tablet 3 tabs po daily x 2 days, then 2 tabs x 3 days, then 1.5 tabs x 3 days, then 1 tab x 3 days, then 0.5 tabs x 3 days 03/08/18   Sherwood Gambler, MD  traZODone (DESYREL) 100 MG tablet Take 1 tablet (100 mg total) by mouth at bedtime. 09/26/17   Fayrene Helper, MD  traZODone (DESYREL) 100 MG tablet TAKE 1 TABLET(100 MG) BY MOUTH AT BEDTIME 12/22/17   Fayrene Helper, MD    Family History Family History  Problem Relation Age of Onset  . Hypertension Sister   . Hypertension Sister   . Hyperlipidemia Sister   . Cancer Sister   . Arthritis/Rheumatoid Sister     Social History Social History   Tobacco Use  . Smoking status: Former Smoker    Last attempt to quit: 12/19/2016    Years since quitting: 1.2  . Smokeless tobacco: Never Used  Substance Use Topics  . Alcohol use: Yes    Comment: occ  . Drug use: No     Allergies   Hydrochlorothiazide w-triamterene  Review of Systems Review of Systems  Constitutional: Negative for fever.  Respiratory: Negative for shortness of breath.   Musculoskeletal: Positive for neck pain.  Neurological: Negative for weakness and numbness.     Physical Exam Updated Vital Signs BP 120/83 (BP Location: Right Arm)   Pulse (!) 57   Temp 97.9 F (36.6 C) (Oral)   Resp 18   Ht 5\' 3"  (1.6 m)   Wt 68 kg   SpO2 100%   BMI 26.57 kg/m   Physical Exam Vitals signs and nursing note reviewed.  Constitutional:      Appearance: She is well-developed. She is not ill-appearing or diaphoretic.  HENT:     Head: Normocephalic and atraumatic.     Right Ear: External ear normal.     Left Ear: External ear normal.     Nose: Nose normal.  Eyes:     General:        Right  eye: No discharge.        Left eye: No discharge.  Neck:     Musculoskeletal: Muscular tenderness present. No spinous process tenderness.   Cardiovascular:     Rate and Rhythm: Normal rate and regular rhythm.     Pulses:          Radial pulses are 2+ on the left side.     Heart sounds: Normal heart sounds.  Pulmonary:     Effort: Pulmonary effort is normal.     Breath sounds: Normal breath sounds.  Abdominal:     General: There is no distension.  Musculoskeletal:     Comments: Diffuse tenderness to left lateral posterior neck, left trapezius. Normal strength/sensation in LUE.   Skin:    General: Skin is warm and dry.  Neurological:     Mental Status: She is alert.  Psychiatric:        Mood and Affect: Mood is not anxious.      ED Treatments / Results  Labs (all labs ordered are listed, but only abnormal results are displayed) Labs Reviewed - No data to display  EKG EKG Interpretation  Date/Time:  Tuesday March 07 2018 08:11:22 EST Ventricular Rate:  54 PR Interval:    QRS Duration: 92 QT Interval:  423 QTC Calculation: 401 R Axis:   9 Text Interpretation:  Sinus rhythm Low voltage, precordial leads Borderline T abnormalities, anterior leads no significant change since Dec 2017 Confirmed by Sherwood Gambler [phone]) on 03/07/2018 8:17:22 AM   Radiology No results found.  Procedures Procedures (including critical care time)  Medications Ordered in ED Medications  ketorolac (TORADOL) 30 MG/ML injection 30 mg (30 mg Intramuscular Given 03/07/18 0815)  cyclobenzaprine (FLEXERIL) tablet 10 mg (10 mg Oral Given 03/07/18 0813)  predniSONE (DELTASONE) tablet 60 mg (60 mg Oral Given 03/07/18 0813)     Initial Impression / Assessment and Plan / ED Course  I have reviewed the triage vital signs and the nursing notes.  Pertinent labs & imaging results that were available during my care of the patient were reviewed by me and considered in my medical decision making (see  chart for details).     Patient appears to have acute cervical radiculopathy.  Given her age, ECG obtained and is benign.  Otherwise, there has been no trauma and there are no acute hard neurologic signs to be concern for acute cauda equina or need an emergent MRI.  She will be given IM Toradol, Flexeril and prednisone and started on a steroid taper in addition  to NSAIDs and muscle relaxer.  She is advised to follow-up with PCP for outpatient MRI and further work-up.  We discussed strict return precautions.  Final Clinical Impressions(s) / ED Diagnoses   Final diagnoses:  Cervical radiculopathy    ED Discharge Orders         Ordered    predniSONE (DELTASONE) 20 MG tablet     03/07/18 0818    naproxen (NAPROSYN) 375 MG tablet  2 times daily     03/07/18 0818    cyclobenzaprine (FLEXERIL) 10 MG tablet  3 times daily PRN     03/07/18 0818           Sherwood Gambler, MD 03/07/18 [phone]

## 2018-03-07 NOTE — Discharge Instructions (Addendum)
If you develop severe or uncontrolled pain, weakness or numbness in your arms or legs, trouble breathing or speaking, blurry vision, incontinence of your bowels or bladder, fever, or any other new/concerning symptoms then return to the ER for evaluation.  Do not take anti-inflammatories as you have been prescribed naproxen.  This includes ibuprofen, Aleve, Advil, Motrin, etc.  Do not drive or operate heavy machinery or use alcohol while on the Flexeril.

## 2018-03-07 NOTE — ED Triage Notes (Signed)
Pt c/o pain in neck radiating down left arm and tingling in left hand since yesterday.  Reports pain worse with any type of movement.  Denies injury.  Pt says she woke up hurting yesterday.

## 2018-03-10 ENCOUNTER — Telehealth (INDEPENDENT_AMBULATORY_CARE_PROVIDER_SITE_OTHER): Payer: Self-pay | Admitting: *Deleted

## 2018-03-10 NOTE — Telephone Encounter (Signed)
Referring MD/PCP: simpson   Procedure: tcs  Reason/Indication:  screening  Has patient had this procedure before?  no             If so, when, by whom and where?    Is there a family history of colon cancer?  no             Who?  What age when diagnosed?    Is patient diabetic?   no                                                  Does patient have prosthetic heart valve or mechanical valve?  no  Do you have a pacemaker?  no  Has patient ever had endocarditis? no  Has patient had joint replacement within last 12 months?  no  Is patient constipated or do they take laxatives? no  Does patient have a history of alcohol/drug use?  no  Is patient on blood thinner such as Coumadin, Plavix and/or Aspirin? no  Medications: bisoprolol/hctz daily, trazadone 100 mg nightly  10/16/17 NO NEED FOR PROPOFOL PER DR REHMAN  Allergies: hctz/triam  Medication Adjustment per Dr Lindi Adie, NP:   Procedure date & time: 04/12/18 at 930

## 2018-03-14 NOTE — Telephone Encounter (Signed)
agree

## 2018-03-21 ENCOUNTER — Ambulatory Visit: Payer: BLUE CROSS/BLUE SHIELD | Admitting: Family Medicine

## 2018-04-04 ENCOUNTER — Ambulatory Visit (HOSPITAL_COMMUNITY)
Admission: RE | Admit: 2018-04-04 | Discharge: 2018-04-04 | Disposition: A | Discharge status: Home / Self Care | Payer: BLUE CROSS/BLUE SHIELD | Source: Ambulatory Visit | Attending: Family Medicine | Admitting: Family Medicine

## 2018-04-04 ENCOUNTER — Encounter: Payer: Self-pay | Admitting: Family Medicine

## 2018-04-04 ENCOUNTER — Ambulatory Visit: Payer: BLUE CROSS/BLUE SHIELD | Admitting: Family Medicine

## 2018-04-04 VITALS — BP 110/74 | HR 69 | Resp 14 | Ht 62.0 in | Wt 148.0 lb

## 2018-04-04 DIAGNOSIS — I10 Essential (primary) hypertension: Secondary | ICD-10-CM

## 2018-04-04 DIAGNOSIS — Z09 Encounter for follow-up examination after completed treatment for conditions other than malignant neoplasm: Secondary | ICD-10-CM | POA: Diagnosis not present

## 2018-04-04 DIAGNOSIS — E663 Overweight: Secondary | ICD-10-CM

## 2018-04-04 DIAGNOSIS — M542 Cervicalgia: Secondary | ICD-10-CM | POA: Diagnosis not present

## 2018-04-04 DIAGNOSIS — M4722 Other spondylosis with radiculopathy, cervical region: Secondary | ICD-10-CM | POA: Diagnosis not present

## 2018-04-04 DIAGNOSIS — F5101 Primary insomnia: Secondary | ICD-10-CM

## 2018-04-04 DIAGNOSIS — E782 Mixed hyperlipidemia: Secondary | ICD-10-CM

## 2018-04-04 DIAGNOSIS — E559 Vitamin D deficiency, unspecified: Secondary | ICD-10-CM

## 2018-04-04 DIAGNOSIS — M62838 Other muscle spasm: Secondary | ICD-10-CM

## 2018-04-04 DIAGNOSIS — R7303 Prediabetes: Secondary | ICD-10-CM

## 2018-04-04 MED ORDER — IBUPROFEN 800 MG PO TABS: 800.0000 mg | ORAL_TABLET | Freq: Three times a day (TID) | ORAL | 1 refills | Status: DC | PRN

## 2018-04-04 MED ORDER — TRAZODONE HCL 100 MG PO TABS: ORAL_TABLET | ORAL | 5 refills | Status: DC

## 2018-04-04 MED ORDER — PREDNISONE 10 MG PO TABS: 10.0000 mg | ORAL_TABLET | Freq: Two times a day (BID) | ORAL | 0 refills | Status: DC

## 2018-04-04 MED ORDER — TIZANIDINE HCL 4 MG PO TABS: ORAL_TABLET | ORAL | 1 refills | Status: DC

## 2018-04-04 NOTE — Patient Instructions (Addendum)
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 Take for only 3 days at a time and only when you have a flare.Take tylenol Es for regular use for arthritis if needed  Practice good sleep hygiene and trazoddone is increased to TWO at bedtime   Fasting CBC, lipid, cmp and EGFr, lipid and TSH and Vit D last week in July before physical exam,  Thank you  for choosing [facility]. We consider it a privelige to serve you.  Delivering excellent health care in a caring and  compassionate way is our goal.  Partnering with you,  so that together we can achieve this goal is our strategy.

## 2018-04-08 ENCOUNTER — Encounter: Payer: Self-pay | Admitting: Family Medicine

## 2018-04-08 DIAGNOSIS — M47812 Spondylosis without myelopathy or radiculopathy, cervical region: Secondary | ICD-10-CM | POA: Insufficient documentation

## 2018-04-08 NOTE — Assessment & Plan Note (Signed)
Hyperlipidemia:Low fat diet discussed and encouraged.   Lipid Panel  Lab Results  Component Value Date   CHOL 230 (H) 09/26/2017   HDL 52 09/26/2017   LDLCALC 159 (H) 09/26/2017   TRIG 89 09/26/2017   CHOLHDL 4.4 09/26/2017   Needs to lower fat intake Updated lab needed at/ before next visit.

## 2018-04-08 NOTE — Progress Notes (Signed)
Catherine Mclean     MRN: [id number]      DOB: Jun 26, 1960   HPI Ms. Catherine Mclean is here for follow up and re-evaluation recent ed visit when she presented with severe neck and upper left extremity pain and spasm, states her symptomswere so severe , she was worried that she may have been having a heart attack, pain and spasm are most likely due to severe C spine disease, she benefited from a course of prednisone and is requesting a refill ROS Denies recent fever or chills. Denies sinus pressure, nasal congestion, ear pain or sore throat. Denies chest congestion, productive cough or wheezing. Denies chest pains, palpitations and leg swelling Denies abdominal pain, nausea, vomiting,diarrhea or constipation.   Denies dysuria, frequency, hesitancy or incontinence.  Denies headaches, seizures, numbness, or tingling. Denies depression, anxiety has chronic insomnia. Denies skin break down or rash.   PE  BP 110/74   Pulse 69   Resp 14   Ht 5\' 2"  (1.575 m)   Wt 148 lb (67.1 kg)   SpO2 99%   BMI 27.07 kg/m   Patient alert and oriented and in no cardiopulmonary distress.  HEENT: No facial asymmetry, EOMI,   oropharynx pink and moist.  Neck decreased ROM with left neck spasm, no JVD, no mass.  Chest: Clear to auscultation bilaterally.  CVS: S1, S2 no murmurs, no S3.Regular rate.  ABD: Soft non tender.   Ext: No edema  MS: Adequate ROM spine, shoulders, hips and knees.  Skin: Intact, no ulcerations or rash noted.  Psych: Good eye contact, normal affect. Memory intact not anxious or depressed appearing.  CNS: CN 2-12 intact, power,  normal throughout.no focal deficits noted.   Assessment & Plan  Encounter for examination following treatment at hospital Patient in for follow up of recent Ed visit. Discharge summary,  reviewed, and any questions or concerns about recent visit  are discussed. Specific issues requiring follow up are specifically addressed.   DJD (degenerative joint  disease) of cervical spine X ray of C spine ordered at visit and reviewed, ,the report is severe arthritis and disc disease , pt made aware Additional anti inflammatory course prescribed  Trapezius muscle spasm Muscle relaxant for as needed bedtime use is prescribed  Essential hypertension Controlled, no change in medication DASH diet and commitment to daily physical activity for a minimum of 30 minutes discussed and encouraged, as a part of hypertension management. The importance of attaining a healthy weight is also discussed.  BP/Weight 04/04/2018 03/07/2018 09/26/2017 03/28/2017 09/14/2016 05/31/2016 4/94/4967  Systolic BP 591 638 466 599 357 017 793  Diastolic BP 74 83 68 82 70 73 80  Wt. (Lbs) 148 150 151.12 150 150.75 146 155.04  BMI 27.07 26.57 27.64 27.44 27.57 26.7 27.46       Hyperlipemia Hyperlipidemia:Low fat diet discussed and encouraged.   Lipid Panel  Lab Results  Component Value Date   CHOL 230 (H) 09/26/2017   HDL 52 09/26/2017   LDLCALC 159 (H) 09/26/2017   TRIG 89 09/26/2017   CHOLHDL 4.4 09/26/2017   Needs to lower fat intake Updated lab needed at/ before next visit.     Overweight (BMI 25.0-29.9) Unchanged Patient re-educated about  the importance of commitment to a  minimum of 150 minutes of exercise per week as able.  The importance of healthy food choices with portion control discussed, as well as eating regularly and within a 12 hour window most days. The need to choose "clean , green"  food 50 to 75% of the time is discussed, as well as to make water the primary drink and set a goal of 64 ounces water daily.  Encouraged to start a food diary,  and to consider  joining a support group. Sample diet sheets offered. Goals set by the patient for the next several months.   Weight /BMI 04/04/2018 03/07/2018 09/26/2017  WEIGHT 148 lb 150 lb 151 lb 1.9 oz  HEIGHT 5\' 2"  5\' 3"  5\' 2"   BMI 27.07 kg/m2 26.57 kg/m2 27.64 kg/m2      Prediabetes Patient  educated about the importance of limiting  Carbohydrate intake , the need to commit to daily physical activity for a minimum of 30 minutes , and to commit weight loss. The fact that changes in all these areas will reduce or eliminate all together the development of diabetes is stressed.   Diabetic Labs Latest Ref Rng & Units 09/26/2017 08/30/2016 02/04/2016 04/24/2015 09/03/2014  HbA1c <5.7 % - 5.4 - 5.7(H) -  Chol <200 mg/dL 230(H) 206(H) - - -  HDL >50 mg/dL 52 52 - - -  Calc LDL mg/dL (calc) 159(H) 137(H) - - -  Triglycerides <150 mg/dL 89 87 - - -  Creatinine 0.50 - 1.05 mg/dL 0.90 0.92 0.91 0.88 0.94   BP/Weight 04/04/2018 03/07/2018 09/26/2017 03/28/2017 09/14/2016 05/31/2016 5/32/9924  Systolic BP 268 341 962 229 798 921 194  Diastolic BP 74 83 68 82 70 73 80  Wt. (Lbs) 148 150 151.12 150 150.75 146 155.04  BMI 27.07 26.57 27.64 27.44 27.57 26.7 27.46   No flowsheet data found.    Insomnia Sleep hygiene reviewed and written information offered also. Prescription sent for  medication needed.

## 2018-04-08 NOTE — Assessment & Plan Note (Signed)
Sleep hygiene reviewed and written information offered also. Prescription sent for  medication needed.  

## 2018-04-08 NOTE — Assessment & Plan Note (Addendum)
X ray of C spine ordered at visit and reviewed, ,the report is severe arthritis and disc disease , pt made aware Additional anti inflammatory course prescribed

## 2018-04-08 NOTE — Assessment & Plan Note (Signed)
Patient in for follow up of recent Ed visit. Discharge summary,  reviewed, and any questions or concerns about recent visit  are discussed. Specific issues requiring follow up are specifically addressed.

## 2018-04-08 NOTE — Assessment & Plan Note (Signed)
Controlled, no change in medication DASH diet and commitment to daily physical activity for a minimum of 30 minutes discussed and encouraged, as a part of hypertension management. The importance of attaining a healthy weight is also discussed.  BP/Weight 04/04/2018 03/07/2018 09/26/2017 03/28/2017 09/14/2016 05/31/2016 05/26/7094  Systolic BP 438 381 840 375 436 067 703  Diastolic BP 74 83 68 82 70 73 80  Wt. (Lbs) 148 150 151.12 150 150.75 146 155.04  BMI 27.07 26.57 27.64 27.44 27.57 26.7 27.46

## 2018-04-08 NOTE — Assessment & Plan Note (Signed)
Unchanged Patient re-educated about  the importance of commitment to a  minimum of 150 minutes of exercise per week as able.  The importance of healthy food choices with portion control discussed, as well as eating regularly and within a 12 hour window most days. The need to choose "clean , green" food 50 to 75% of the time is discussed, as well as to make water the primary drink and set a goal of 64 ounces water daily.  Encouraged to start a food diary,  and to consider  joining a support group. Sample diet sheets offered. Goals set by the patient for the next several months.   Weight /BMI 04/04/2018 03/07/2018 09/26/2017  WEIGHT 148 lb 150 lb 151 lb 1.9 oz  HEIGHT 5\' 2"  5\' 3"  5\' 2"   BMI 27.07 kg/m2 26.57 kg/m2 27.64 kg/m2

## 2018-04-08 NOTE — Assessment & Plan Note (Signed)
Patient educated about the importance of limiting  Carbohydrate intake , the need to commit to daily physical activity for a minimum of 30 minutes , and to commit weight loss. The fact that changes in all these areas will reduce or eliminate all together the development of diabetes is stressed.   Diabetic Labs Latest Ref Rng & Units 09/26/2017 08/30/2016 02/04/2016 04/24/2015 09/03/2014  HbA1c <5.7 % - 5.4 - 5.7(H) -  Chol <200 mg/dL 230(H) 206(H) - - -  HDL >50 mg/dL 52 52 - - -  Calc LDL mg/dL (calc) 159(H) 137(H) - - -  Triglycerides <150 mg/dL 89 87 - - -  Creatinine 0.50 - 1.05 mg/dL 0.90 0.92 0.91 0.88 0.94   BP/Weight 04/04/2018 03/07/2018 09/26/2017 03/28/2017 09/14/2016 05/31/2016 5/40/9811  Systolic BP 914 782 956 213 086 578 469  Diastolic BP 74 83 68 82 70 73 80  Wt. (Lbs) 148 150 151.12 150 150.75 146 155.04  BMI 27.07 26.57 27.64 27.44 27.57 26.7 27.46   No flowsheet data found.

## 2018-04-08 NOTE — Assessment & Plan Note (Signed)
Muscle relaxant for as needed bedtime use is prescribed

## 2018-04-12 ENCOUNTER — Ambulatory Visit (HOSPITAL_COMMUNITY)
Admission: RE | Admit: 2018-04-12 | Discharge: 2018-04-12 | Disposition: A | Discharge status: Home / Self Care | Payer: BLUE CROSS/BLUE SHIELD | Attending: Internal Medicine | Admitting: Internal Medicine

## 2018-04-12 ENCOUNTER — Encounter (HOSPITAL_COMMUNITY): Payer: Self-pay | Admitting: *Deleted

## 2018-04-12 ENCOUNTER — Encounter (HOSPITAL_COMMUNITY)
Admission: RE | Disposition: A | Discharge status: Home / Self Care | Payer: Self-pay | Source: Home / Self Care | Attending: Internal Medicine

## 2018-04-12 ENCOUNTER — Other Ambulatory Visit: Payer: Self-pay

## 2018-04-12 DIAGNOSIS — K6389 Other specified diseases of intestine: Secondary | ICD-10-CM | POA: Diagnosis not present

## 2018-04-12 DIAGNOSIS — Z79899 Other long term (current) drug therapy: Secondary | ICD-10-CM | POA: Insufficient documentation

## 2018-04-12 DIAGNOSIS — Z791 Long term (current) use of non-steroidal anti-inflammatories (NSAID): Secondary | ICD-10-CM | POA: Diagnosis not present

## 2018-04-12 DIAGNOSIS — K598 Other specified functional intestinal disorders: Secondary | ICD-10-CM | POA: Diagnosis not present

## 2018-04-12 DIAGNOSIS — Z8261 Family history of arthritis: Secondary | ICD-10-CM | POA: Diagnosis not present

## 2018-04-12 DIAGNOSIS — I10 Essential (primary) hypertension: Secondary | ICD-10-CM | POA: Diagnosis not present

## 2018-04-12 DIAGNOSIS — F329 Major depressive disorder, single episode, unspecified: Secondary | ICD-10-CM | POA: Insufficient documentation

## 2018-04-12 DIAGNOSIS — K644 Residual hemorrhoidal skin tags: Secondary | ICD-10-CM | POA: Insufficient documentation

## 2018-04-12 DIAGNOSIS — D123 Benign neoplasm of transverse colon: Secondary | ICD-10-CM | POA: Insufficient documentation

## 2018-04-12 DIAGNOSIS — Z8379 Family history of other diseases of the digestive system: Secondary | ICD-10-CM | POA: Diagnosis not present

## 2018-04-12 DIAGNOSIS — F419 Anxiety disorder, unspecified: Secondary | ICD-10-CM | POA: Insufficient documentation

## 2018-04-12 DIAGNOSIS — Z888 Allergy status to other drugs, medicaments and biological substances status: Secondary | ICD-10-CM | POA: Diagnosis not present

## 2018-04-12 DIAGNOSIS — Z87891 Personal history of nicotine dependence: Secondary | ICD-10-CM | POA: Insufficient documentation

## 2018-04-12 DIAGNOSIS — Z8249 Family history of ischemic heart disease and other diseases of the circulatory system: Secondary | ICD-10-CM | POA: Diagnosis not present

## 2018-04-12 DIAGNOSIS — K6289 Other specified diseases of anus and rectum: Secondary | ICD-10-CM | POA: Diagnosis not present

## 2018-04-12 DIAGNOSIS — Z1211 Encounter for screening for malignant neoplasm of colon: Secondary | ICD-10-CM | POA: Diagnosis not present

## 2018-04-12 DIAGNOSIS — Z09 Encounter for follow-up examination after completed treatment for conditions other than malignant neoplasm: Secondary | ICD-10-CM

## 2018-04-12 HISTORY — DX: Anxiety disorder, unspecified: F41.9

## 2018-04-12 HISTORY — PX: BIOPSY: SHX5522

## 2018-04-12 HISTORY — PX: COLONOSCOPY: SHX5424

## 2018-04-12 HISTORY — PX: POLYPECTOMY: SHX5525

## 2018-04-12 SURGERY — COLONOSCOPY
Anesthesia: Moderate Sedation

## 2018-04-12 MED ORDER — MEPERIDINE HCL 50 MG/ML IJ SOLN
INTRAMUSCULAR | Status: AC
  Filled 2018-04-12: qty 1

## 2018-04-12 MED ORDER — MIDAZOLAM HCL 5 MG/5ML IJ SOLN
INTRAMUSCULAR | Status: DC | PRN
  Administered 2018-04-12 (×5): 2 mg via INTRAVENOUS

## 2018-04-12 MED ORDER — MIDAZOLAM HCL 5 MG/5ML IJ SOLN
INTRAMUSCULAR | Status: AC
  Filled 2018-04-12: qty 5

## 2018-04-12 MED ORDER — MEPERIDINE HCL 50 MG/ML IJ SOLN
INTRAMUSCULAR | Status: DC | PRN
  Administered 2018-04-12 (×4): 25 mg via INTRAVENOUS

## 2018-04-12 MED ORDER — STERILE WATER FOR IRRIGATION IR SOLN
Status: DC | PRN
  Administered 2018-04-12: 1.5 mL

## 2018-04-12 MED ORDER — MIDAZOLAM HCL 5 MG/5ML IJ SOLN
INTRAMUSCULAR | Status: AC
  Filled 2018-04-12: qty 10

## 2018-04-12 MED ORDER — SODIUM CHLORIDE 0.9 % IV SOLN
INTRAVENOUS | Status: DC
  Administered 2018-04-12: 1000 mL via INTRAVENOUS

## 2018-04-12 NOTE — Op Note (Signed)
[facility] Patient Name: Catherine Mclean Procedure Date: 04/12/2018 8:19 AM MRN: [id number] Date of Birth: 05/21/1960 Attending MD: Hildred Laser , MD CSN: [id number] Age: 58 Admit Type: Outpatient Procedure:                Colonoscopy Indications:              Screening for colorectal malignant neoplasm Providers:                Hildred Laser, MD, Lurline Del, RN, Aram Candela Referring MD:             Norwood Levo. Moshe Cipro, MD Medicines:                Meperidine 100 mg IV, Midazolam 10 mg IV Complications:            No immediate complications. Estimated Blood Loss:     Estimated blood loss was minimal. Procedure:                Pre-Anesthesia Assessment:                           - Prior to the procedure, a History and Physical                            was performed, and patient medications and                            allergies were reviewed. The patient's tolerance of                            previous anesthesia was also reviewed. The risks                            and benefits of the procedure and the sedation                            options and risks were discussed with the patient.                            All questions were answered, and informed consent                            was obtained. Prior Anticoagulants: The patient                            last took ibuprofen 7 days prior to the procedure.                            ASA Grade Assessment: II - A patient with mild                            systemic disease. After reviewing the risks and                            benefits, the patient was deemed in satisfactory  condition to undergo the procedure.                           After obtaining informed consent, the colonoscope                            was passed under direct vision. Throughout the                            procedure, the patient's blood pressure, pulse, and                            oxygen saturations were  monitored continuously. The                            PCF-[id number] ([id number]) was introduced through the                            anus and advanced to the the terminal ileum, with                            identification of the appendiceal orifice and IC                            valve. The colonoscopy was performed without                            difficulty. The patient tolerated the procedure                            well. The quality of the bowel preparation was                            adequate. The terminal ileum, ileocecal valve,                            appendiceal orifice, and rectum were photographed. Scope In: 9:00:10 AM Scope Out: 9:18:22 AM Scope Withdrawal Time: 0 hours 13 minutes 36 seconds  Total Procedure Duration: 0 hours 18 minutes 12 seconds  Findings:      The perianal and digital rectal examinations were normal.      The terminal ileum appeared normal.      An area of mildly congested mucosa with erosions was found at the       appendiceal orifice. This was biopsied with a cold forceps for       histology. The pathology specimen was placed into Bottle Number 1.      A 5 mm polyp was found in the splenic flexure. The polyp was sessile.       The polyp was removed with a cold snare. Resection and retrieval were       complete. The pathology specimen was placed into Bottle Number 2.      External hemorrhoids were found during retroflexion. The hemorrhoids       were small.      Anal papilla(e) were hypertrophied. Impression:               -  The examined portion of the ileum was normal.                           - Congested eroded mucosa at the appendiceal                            orifice. Biopsied.                           - One 5 mm polyp at the splenic flexure, removed                            with a cold snare. Resected and retrieved.                           - External hemorrhoids.                           - Anal papilla(e) were hypertrophied.                            Comment: focal inflammation at appendiceal orifice                            possibly due to NSAID use. Moderate Sedation:      Moderate (conscious) sedation was administered by the endoscopy nurse       and supervised by the endoscopist. The following parameters were       monitored: oxygen saturation, heart rate, blood pressure, CO2       capnography and response to care. Total physician intraservice time was       29 minutes. Recommendation:           - Patient has a contact number available for                            emergencies. The signs and symptoms of potential                            delayed complications were discussed with the                            patient. Return to normal activities tomorrow.                            Written discharge instructions were provided to the                            patient.                           - Resume previous diet today.                           - Continue present medications.                           -  No aspirin, ibuprofen, naproxen, or other                            non-steroidal anti-inflammatory drugs for 1 day.                           - Await pathology results.                           - Repeat colonoscopy for surveillance based on                            pathology results. Procedure Code(s):        --- Professional ---                           [id number], Colonoscopy, flexible; with removal of                            tumor(s), polyp(s), or other lesion(s) by snare                            technique                           45380, 59, Colonoscopy, flexible; with biopsy,                            single or multiple                           99153, Moderate sedation; each additional 15                            minutes intraservice time                           G0500, Moderate sedation services provided by the                            same physician or other qualified health  care                            professional performing a gastrointestinal                            endoscopic service that sedation supports,                            requiring the presence of an independent trained                            observer to assist in the monitoring of the                            patient's level of consciousness and physiological  status; initial 15 minutes of intra-service time;                            patient age 51 years or older (additional time may                            be reported with [phone], as appropriate) Diagnosis Code(s):        --- Professional ---                           Z12.11, Encounter for screening for malignant                            neoplasm of colon                           K64.4, Residual hemorrhoidal skin tags                           K63.89, Other specified diseases of intestine                           D12.3, Benign neoplasm of transverse colon (hepatic                            flexure or splenic flexure)                           K62.89, Other specified diseases of anus and rectum CPT copyright 2018 American [employer]. All rights reserved. The codes documented in this report are preliminary and upon coder review may  be revised to meet current compliance requirements. Hildred Laser, MD Hildred Laser, MD 04/12/2018 9:34:14 AM This report has been signed electronically. Number of Addenda: 0

## 2018-04-12 NOTE — Discharge Instructions (Signed)
Colonoscopy, Adult, Care After This sheet gives you information about how to care for yourself after your procedure. Your health care provider may also give you more specific instructions. If you have problems or questions, contact your health care provider. What can I expect after the procedure? After the procedure, it is common to have:  A small amount of blood in your stool for 24 hours after the procedure.  Some gas.  Mild abdominal cramping or bloating. Follow these instructions at home: General instructions  For the first 24 hours after the procedure: ? Do not drive or use machinery. ? Do not sign important documents. ? Do not drink alcohol. ? Do your regular daily activities at a slower pace than normal. ? Eat soft, easy-to-digest foods.  Take over-the-counter or prescription medicines only as told by your health care provider. Relieving cramping and bloating   Try walking around when you have cramps or feel bloated.  Apply heat to your abdomen as told by your health care provider. Use a heat source that your health care provider recommends, such as a moist heat pack or a heating pad. ? Place a towel between your skin and the heat source. ? Leave the heat on for 20-30 minutes. ? Remove the heat if your skin turns bright red. This is especially important if you are unable to feel pain, heat, or cold. You may have a greater risk of getting burned. Eating and drinking   Drink enough fluid to keep your urine pale yellow.  Resume your normal diet as instructed by your health care provider. Avoid heavy or fried foods that are hard to digest.  Avoid drinking alcohol for as long as instructed by your health care provider. Contact a health care provider if:  You have blood in your stool 2-3 days after the procedure. Get help right away if:  You have more than a small spotting of blood in your stool.  You pass large blood clots in your stool.  Your abdomen is  swollen.  You have nausea or vomiting.  You have a fever.  You have increasing abdominal pain that is not relieved with medicine. Summary  After the procedure, it is common to have a small amount of blood in your stool. You may also have mild abdominal cramping and bloating.  For the first 24 hours after the procedure, do not drive or use machinery, sign important documents, or drink alcohol.  Contact your health care provider if you have a lot of blood in your stool, nausea or vomiting, a fever, or increased abdominal pain. This information is not intended to replace advice given to you by your health care provider. Make sure you discuss any questions you have with your health care provider. Document Released: 09/23/2003 Document Revised: 12/01/2016 Document Reviewed: 04/22/2015 Elsevier Interactive Patient Education  2019 [employer] Colon Polyps  Polyps are tissue growths inside the body. Polyps can grow in many places, including the large intestine (colon). A polyp may be a round bump or a mushroom-shaped growth. You could have one polyp or several. Most colon polyps are noncancerous (benign). However, some colon polyps can become cancerous over time. Finding and removing the polyps early can help prevent this. What are the causes? The exact cause of colon polyps is not known. What increases the risk? You are more likely to develop this condition if you:  Have a family history of colon cancer or colon polyps.  Are older than 50 or older than 45 if  you are African American.  Have inflammatory bowel disease, such as ulcerative colitis or Crohn's disease.  Have certain hereditary conditions, such as: ? Familial adenomatous polyposis. ? Lynch syndrome. ? Turcot syndrome. ? Peutz-Jeghers syndrome.  Are overweight.  Smoke cigarettes.  Do not get enough exercise.  Drink too much alcohol.  Eat a diet that is high in fat and red meat and low in fiber.  Had childhood  cancer that was treated with abdominal radiation. What are the signs or symptoms? Most polyps do not cause symptoms. If you have symptoms, they may include:  Blood coming from your rectum when having a bowel movement.  Blood in your stool. The stool may look dark red or black.  Abdominal pain.  A change in bowel habits, such as constipation or diarrhea. How is this diagnosed? This condition is diagnosed with a colonoscopy. This is a procedure in which a lighted, flexible scope is inserted into the anus and then passed into the colon to examine the area. Polyps are sometimes found when a colonoscopy is done as part of routine cancer screening tests. How is this treated? Treatment for this condition involves removing any polyps that are found. Most polyps can be removed during a colonoscopy. Those polyps will then be tested for cancer. Additional treatment may be needed depending on the results of testing. Follow these instructions at home: Lifestyle  Maintain a healthy weight, or lose weight if recommended by your health care provider.  Exercise every day or as told by your health care provider.  Do not use any products that contain nicotine or tobacco, such as cigarettes and e-cigarettes. If you need help quitting, ask your health care provider.  If you drink alcohol, limit how much you have: ? 0-1 drink a day for women. ? 0-2 drinks a day for men.  Be aware of how much alcohol is in your drink. In the [place] one drink equals one 12 oz bottle of beer (355 mL), one 5 oz glass of wine (148 mL), or one 1 oz shot of hard liquor (44 mL). Eating and drinking   Eat foods that are high in fiber, such as fruits, vegetables, and whole grains.  Eat foods that are high in calcium and vitamin D, such as milk, cheese, yogurt, eggs, liver, fish, and broccoli.  Limit foods that are high in fat, such as fried foods and desserts.  Limit the amount of red meat and processed meat you eat, such as  hot dogs, sausage, bacon, and lunch meats. General instructions  Keep all follow-up visits as told by your health care provider. This is important. ? This includes having regularly scheduled colonoscopies. ? Talk to your health care provider about when you need a colonoscopy. Contact a health care provider if:  You have new or worsening bleeding during a bowel movement.  You have new or increased blood in your stool.  You have a change in bowel habits.  You lose weight for no known reason. Summary  Polyps are tissue growths inside the body. Polyps can grow in many places, including the colon.  Most colon polyps are noncancerous (benign), but some can become cancerous over time.  This condition is diagnosed with a colonoscopy.  Treatment for this condition involves removing any polyps that are found. Most polyps can be removed during a colonoscopy. This information is not intended to replace advice given to you by your health care provider. Make sure you discuss any questions you have with your health  care provider. Document Released: 11/05/2003 Document Revised: 05/26/2017 Document Reviewed: 05/26/2017 Elsevier Interactive Patient Education  2019 [employer]. No aspirin or NSAIDs for 24 hours. Do not take ibuprofen if you are taking or Naprosyn. Resume other medications and diet as before. No driving for 24 hours. Physician will call with biopsy results.

## 2018-04-12 NOTE — H&P (Signed)
Catherine Mclean is an 58 y.o. female.   Chief Complaint: Patient is here for colonoscopy. HPI: Patient is 58 year old African-American female who is here for screening colonoscopy.  She denies abdominal pain or change in bowel habits.  She states she noted hematochezia with a bowel movement about a week ago.  She reports that she was constipated when this occurred. Family history is negative for colorectal carcinoma. She has 1 sister with ulcerative colitis.  Past Medical History:  Diagnosis Date  . Anxiety   . Depression   . Hypertension     Past Surgical History:  Procedure Laterality Date  . OVARY SURGERY      Family History  Problem Relation Age of Onset  . Hypertension Sister   . Hypertension Sister   . Hyperlipidemia Sister   . Ulcerative colitis Sister   . Arthritis/Rheumatoid Sister    Social History:  reports that she quit smoking about 15 months ago. She has never used smokeless tobacco. She reports current alcohol use. She reports that she does not use drugs.  Allergies:  Allergies  Allergen Reactions  . Hydrochlorothiazide W-Triamterene     MYOCITIS    Facility-Administered Medications Prior to Admission  Medication Dose Route Frequency Provider Last Rate Last Dose  . Influenza (>/= 3 years) inactive virus vaccine (FLVIRIN/FLUZONE) injection SUSP 0.5 mL  0.5 mL Intramuscular Once Fayrene Helper, MD       Medications Prior to Admission  Medication Sig Dispense Refill  . bisoprolol-hydrochlorothiazide (ZIAC) 5-6.25 MG tablet Take 1 tablet by mouth daily. 90 tablet 3  . predniSONE (DELTASONE) 10 MG tablet Take 1 tablet (10 mg total) by mouth 2 (two) times daily with a meal. 20 tablet 0  . traZODone (DESYREL) 100 MG tablet Take two tablets at bedtime for sleep (Patient taking differently: Take 100 mg by mouth at bedtime. ) 60 tablet 5  . cyclobenzaprine (FLEXERIL) 10 MG tablet Take 1 tablet (10 mg total) by mouth 3 (three) times daily as needed for muscle  spasms. (Patient not taking: Reported on 04/06/2018) 15 tablet 0  . ibuprofen (ADVIL,MOTRIN) 800 MG tablet Take 1 tablet (800 mg total) by mouth every 8 (eight) hours as needed. 30 tablet 1  . naproxen (NAPROSYN) 375 MG tablet Take 1 tablet (375 mg total) by mouth 2 (two) times daily. (Patient not taking: Reported on 04/06/2018) 20 tablet 0  . tiZANidine (ZANAFLEX) 4 MG tablet Take one tablet two times daily as needed, for muscle spasm 40 tablet 1    No results found for this or any previous visit (from the past 48 hour(s)). No results found.  ROS  Blood pressure (!) 143/81, pulse 67, temperature (!) 97.5 F (36.4 C), temperature source Oral, resp. rate 19, height 5\' 2"  (1.575 m), weight 67.1 kg, SpO2 100 %. Physical Exam  Constitutional: She appears well-developed and well-nourished.  HENT:  Mouth/Throat: Oropharynx is clear and moist.  Eyes: Conjunctivae are normal. No scleral icterus.  Neck: No thyromegaly present.  Cardiovascular: Normal rate, regular rhythm and normal heart sounds.  No murmur heard. Respiratory: Effort normal and breath sounds normal.  GI:  Abdomen is symmetrical.  She has Pfannenstiel scar.  Abdomen is soft and nontender with organomegaly or masses.  Musculoskeletal:        General: No edema.  Lymphadenopathy:    She has no cervical adenopathy.  Neurological: She is alert.  Skin: Skin is warm and dry.     Assessment/Plan Average risk screening colonoscopy.  [employer]  Laural Golden, MD 04/12/2018, 8:41 AM

## 2018-04-14 ENCOUNTER — Encounter (HOSPITAL_COMMUNITY): Payer: Self-pay | Admitting: Internal Medicine

## 2018-06-06 ENCOUNTER — Other Ambulatory Visit: Payer: Self-pay | Admitting: Family Medicine

## 2018-08-21 ENCOUNTER — Other Ambulatory Visit: Payer: Self-pay | Admitting: Family Medicine

## 2018-10-02 ENCOUNTER — Encounter (INDEPENDENT_AMBULATORY_CARE_PROVIDER_SITE_OTHER): Payer: Self-pay

## 2018-10-02 ENCOUNTER — Other Ambulatory Visit: Payer: Self-pay

## 2018-10-02 ENCOUNTER — Encounter: Payer: Self-pay | Admitting: Family Medicine

## 2018-10-02 ENCOUNTER — Ambulatory Visit (INDEPENDENT_AMBULATORY_CARE_PROVIDER_SITE_OTHER): Payer: BLUE CROSS/BLUE SHIELD | Admitting: Family Medicine

## 2018-10-02 VITALS — BP 124/78 | HR 61 | Resp 15 | Ht 62.0 in | Wt 146.0 lb

## 2018-10-02 DIAGNOSIS — Z1231 Encounter for screening mammogram for malignant neoplasm of breast: Secondary | ICD-10-CM | POA: Diagnosis not present

## 2018-10-02 DIAGNOSIS — M62838 Other muscle spasm: Secondary | ICD-10-CM | POA: Diagnosis not present

## 2018-10-02 DIAGNOSIS — Z0000 Encounter for general adult medical examination without abnormal findings: Secondary | ICD-10-CM

## 2018-10-02 DIAGNOSIS — M542 Cervicalgia: Secondary | ICD-10-CM | POA: Insufficient documentation

## 2018-10-02 DIAGNOSIS — F17210 Nicotine dependence, cigarettes, uncomplicated: Secondary | ICD-10-CM

## 2018-10-02 DIAGNOSIS — F419 Anxiety disorder, unspecified: Secondary | ICD-10-CM | POA: Diagnosis not present

## 2018-10-02 MED ORDER — BISOPROLOL-HYDROCHLOROTHIAZIDE 5-6.25 MG PO TABS: 1.0000 | ORAL_TABLET | Freq: Every day | ORAL | 3 refills | Status: DC

## 2018-10-02 MED ORDER — PREDNISONE 10 MG PO TABS: 10.0000 mg | ORAL_TABLET | Freq: Two times a day (BID) | ORAL | 0 refills | Status: AC

## 2018-10-02 MED ORDER — METHYLPREDNISOLONE ACETATE 80 MG/ML IJ SUSP
80.0000 mg | Freq: Once | INTRAMUSCULAR | Status: AC
  Administered 2018-10-02: 09:00:00 80 mg via INTRAMUSCULAR

## 2018-10-02 MED ORDER — HYDROXYZINE HCL 50 MG PO TABS: ORAL_TABLET | ORAL | 3 refills | Status: DC

## 2018-10-02 MED ORDER — KETOROLAC TROMETHAMINE 60 MG/2ML IM SOLN
60.0000 mg | Freq: Once | INTRAMUSCULAR | Status: AC
  Administered 2018-10-02: 09:00:00 60 mg via INTRAMUSCULAR

## 2018-10-02 MED ORDER — BUSPIRONE HCL 5 MG PO TABS: 5.0000 mg | ORAL_TABLET | Freq: Two times a day (BID) | ORAL | 3 refills | Status: DC

## 2018-10-02 MED ORDER — TIZANIDINE HCL 4 MG PO TABS: ORAL_TABLET | ORAL | 5 refills | Status: DC

## 2018-10-02 NOTE — Progress Notes (Signed)
     Catherine Mclean     MRN: [id number]      DOB: 1961/02/17  HPI: Patient is in for annual physical exam. C/o poor sleep, increased anxiety increased neckpain and stiffness x 4 months, Smoking x 4 months, wants to quit  No labs available will get tioday Immunization is reviewed , and  updated if needed.   PE: BP 124/78   Pulse 61   Resp 15   Ht 5\' 2"  (1.575 m)   Wt 146 lb (66.2 kg)   SpO2 100%   BMI 26.70 kg/m   Pleasant  female, alert and oriented x 3, in no cardio-pulmonary distress. Afebrile. HEENT No facial trauma or asymetry. Sinuses non tender.  Extra occullar muscles intact, External ears normal,  Neck: decreased ROM with spasm, no adenopathy,JVD or thyromegaly.No bruits.  Chest: Clear to ascultation bilaterally.No crackles or wheezes. Non tender to palpation  Breast: Not examined  Cardiovascular system; Heart sounds normal,  S1 and  S2 ,no S3.  No murmur, or thrill. Apical beat not displaced Peripheral pulses normal.  Abdomen: Soft, non tender, no organomegaly or masses. No bruits. Bowel sounds normal. No guarding, tenderness or rebound.  Musculoskeletal exam: Full ROM of , hips , shoulders and knees. No deformity ,swelling or crepitus noted. No muscle wasting or atrophy.   Neurologic: Cranial nerves 2 to 12 intact. Power, tone ,sensation and reflexes normal throughout. No disturbance in gait. No tremor.  Skin: Intact, no ulceration, erythema , scaling or rash noted. Pigmentation normal throughout  Psych; Mildly anxious  mood and affect. Judgement and concentration normal   Assessment & Plan:  Neck pain Uncontrolled.Toradol and depo medrol administered IM in the office , to be followed by a short course of oral prednisone   Annual physical exam Annual exam as documented. Counseling done  re healthy lifestyle involving commitment to 150 minutes exercise per week, heart healthy diet, and attaining healthy weight.The importance of adequate  sleep also discussed. Regular seat belt use and home safety, is also discussed. Changes in health habits are decided on by the patient with goals and time frames  set for achieving them. Immunization and cancer screening needs are specifically addressed at this visit.   Trapezius muscle spasm Increase dose of Zanaflex to twice daily  Anxiety Increased and uncontrolled, start twice daily buspar and behavioral management  Smoking 1/2 pack a day or less Asked:confirms currently smokes 7 to 10 cigarettes/ day Assess: Unwilling to set a  Quit date  but cutting back Advise: needs to QUIT to reduce risk of cancer, cardio and cerebrovascular disease Assist: counseled for 5 minutes and literature provided Arrange: follow up in 3 months

## 2018-10-02 NOTE — Assessment & Plan Note (Signed)
Uncontrolled.Toradol and depo medrol administered IM in the office , to be followed by a short course of oral prednisone   

## 2018-10-02 NOTE — Patient Instructions (Addendum)
Follow-up with MD in 2 months call if you need me sooner.  Please schedule mammogram at checkout.  Medication office today for neck pain Toradol 60 mg IM and Depo-Medrol 80 mg IM to be followed by prednisone for 5 days only.  Dose increase in muscle relaxant Zanaflex to 1 tablet 2 times daily.  New for anxiety which is currently uncontrolled is BuSpar 5 mg 1 tablet 2 times daily.  Currently smoking 7 cigarettes/day you have decided on quitting which is good a quit date is set for 2 months from now.  Please  Please call [phone] quit now for help with smoking cessation  FASTING lab s tomorrow, already ordered  Thanks for choosing [facility], we consider it a privelige to serve you.

## 2018-10-07 ENCOUNTER — Encounter: Payer: Self-pay | Admitting: Family Medicine

## 2018-10-07 DIAGNOSIS — F419 Anxiety disorder, unspecified: Secondary | ICD-10-CM | POA: Insufficient documentation

## 2018-10-07 DIAGNOSIS — F17210 Nicotine dependence, cigarettes, uncomplicated: Secondary | ICD-10-CM | POA: Insufficient documentation

## 2018-10-07 NOTE — Assessment & Plan Note (Signed)
Increased and uncontrolled, start twice daily buspar and behavioral management

## 2018-10-07 NOTE — Assessment & Plan Note (Signed)

## 2018-10-07 NOTE — Assessment & Plan Note (Signed)
Asked:confirms currently smokes 7 to 10 cigarettes/ day Assess: Unwilling to set a  Quit date  but cutting back Advise: needs to QUIT to reduce risk of cancer, cardio and cerebrovascular disease Assist: counseled for 5 minutes and literature provided Arrange: follow up in 3 months

## 2018-10-07 NOTE — Assessment & Plan Note (Signed)
Increase dose of Zanaflex to twice daily

## 2018-10-09 ENCOUNTER — Ambulatory Visit (HOSPITAL_COMMUNITY): Payer: BLUE CROSS/BLUE SHIELD

## 2018-10-16 ENCOUNTER — Ambulatory Visit: Payer: BLUE CROSS/BLUE SHIELD

## 2018-10-16 ENCOUNTER — Telehealth: Payer: Self-pay

## 2018-10-16 ENCOUNTER — Other Ambulatory Visit: Payer: Self-pay

## 2018-10-16 DIAGNOSIS — Z23 Encounter for immunization: Secondary | ICD-10-CM

## 2018-10-16 DIAGNOSIS — E782 Mixed hyperlipidemia: Secondary | ICD-10-CM

## 2018-10-16 DIAGNOSIS — I10 Essential (primary) hypertension: Secondary | ICD-10-CM

## 2018-10-16 DIAGNOSIS — E559 Vitamin D deficiency, unspecified: Secondary | ICD-10-CM

## 2018-10-16 NOTE — Telephone Encounter (Signed)
Labs ordered.

## 2018-10-16 NOTE — Progress Notes (Signed)
Flu vaccine given.

## 2018-10-17 LAB — LIPID PANEL
Cholesterol: 227 mg/dL — ABNORMAL HIGH (ref <200)
HDL: 57 mg/dL (ref >=50)
LDL Cholesterol (Calc): 147 mg/dL (calc) — ABNORMAL HIGH
Non-HDL Cholesterol (Calc): 170 mg/dL (calc) — ABNORMAL HIGH (ref <130)
Total CHOL/HDL Ratio: 4 (calc) (ref <5.0)
Triglycerides: 113 mg/dL (ref <150)

## 2018-10-17 LAB — COMPLETE METABOLIC PANEL WITH GFR
AG Ratio: 1.4 (calc) (ref 1.0–2.5)
ALT: 13 U/L (ref 6–29)
AST: 27 U/L (ref 10–35)
Albumin: 4.4 g/dL (ref 3.6–5.1)
Alkaline phosphatase (APISO): 98 U/L (ref 37–153)
BUN: 11 mg/dL (ref 7–25)
CO2: 27 mmol/L (ref 20–32)
Calcium: 10.2 mg/dL (ref 8.6–10.4)
Chloride: 105 mmol/L (ref 98–110)
Creat: 0.96 mg/dL (ref 0.50–0.99)
GFR, Est African American: 75 mL/min/{1.73_m2} (ref >=60)
GFR, Est Non African American: 64 mL/min/{1.73_m2} (ref >=60)
Globulin: 3.1 g/dL (calc) (ref 1.9–3.7)
Glucose, Bld: 95 mg/dL (ref 65–99)
Potassium: 4 mmol/L (ref 3.5–5.3)
Sodium: 140 mmol/L (ref 135–146)
Total Bilirubin: 0.5 mg/dL (ref 0.2–1.2)
Total Protein: 7.5 g/dL (ref 6.1–8.1)

## 2018-10-17 LAB — CBC
HCT: 41.7 % (ref 35.0–45.0)
Hemoglobin: 13.6 g/dL (ref 11.7–15.5)
MCH: 27.5 pg (ref 27.0–33.0)
MCHC: 32.6 g/dL (ref 32.0–36.0)
MCV: 84.4 fL (ref 80.0–100.0)
MPV: 11.1 fL (ref 7.5–12.5)
Platelets: 210 10*3/uL (ref 140–400)
RBC: 4.94 10*6/uL (ref 3.80–5.10)
RDW: 13.7 % (ref 11.0–15.0)
WBC: 9.3 10*3/uL (ref 3.8–10.8)

## 2018-10-17 LAB — VITAMIN D 25 HYDROXY (VIT D DEFICIENCY, FRACTURES): Vit D, 25-Hydroxy: 25 ng/mL — ABNORMAL LOW (ref 30–100)

## 2018-10-17 LAB — TSH: TSH: 1.2 mIU/L (ref 0.40–4.50)

## 2018-10-18 ENCOUNTER — Encounter: Payer: Self-pay | Admitting: Family Medicine

## 2018-11-22 ENCOUNTER — Other Ambulatory Visit: Payer: Self-pay | Admitting: Family Medicine

## 2018-12-21 ENCOUNTER — Telehealth: Payer: BLUE CROSS/BLUE SHIELD | Admitting: Family Medicine

## 2018-12-21 ENCOUNTER — Other Ambulatory Visit: Payer: Self-pay

## 2019-02-26 ENCOUNTER — Encounter: Payer: Self-pay | Admitting: Family Medicine

## 2019-02-26 ENCOUNTER — Telehealth (INDEPENDENT_AMBULATORY_CARE_PROVIDER_SITE_OTHER): Payer: BLUE CROSS/BLUE SHIELD | Admitting: Family Medicine

## 2019-02-26 ENCOUNTER — Other Ambulatory Visit: Payer: Self-pay

## 2019-02-26 VITALS — BP 124/78 | HR 61 | Resp 18 | Ht 62.0 in | Wt 146.0 lb

## 2019-02-26 DIAGNOSIS — G4700 Insomnia, unspecified: Secondary | ICD-10-CM

## 2019-02-26 DIAGNOSIS — Z720 Tobacco use: Secondary | ICD-10-CM

## 2019-02-26 DIAGNOSIS — F5101 Primary insomnia: Secondary | ICD-10-CM

## 2019-02-26 DIAGNOSIS — E785 Hyperlipidemia, unspecified: Secondary | ICD-10-CM

## 2019-02-26 DIAGNOSIS — E663 Overweight: Secondary | ICD-10-CM | POA: Diagnosis not present

## 2019-02-26 DIAGNOSIS — Z6826 Body mass index (BMI) 26.0-26.9, adult: Secondary | ICD-10-CM | POA: Diagnosis not present

## 2019-02-26 DIAGNOSIS — F17210 Nicotine dependence, cigarettes, uncomplicated: Secondary | ICD-10-CM

## 2019-02-26 DIAGNOSIS — I10 Essential (primary) hypertension: Secondary | ICD-10-CM | POA: Diagnosis not present

## 2019-02-26 DIAGNOSIS — Z1231 Encounter for screening mammogram for malignant neoplasm of breast: Secondary | ICD-10-CM

## 2019-02-26 MED ORDER — ALPRAZOLAM 0.25 MG PO TABS: ORAL_TABLET | ORAL | 1 refills | Status: DC

## 2019-02-26 MED ORDER — TEMAZEPAM 30 MG PO CAPS: 30.0000 mg | ORAL_CAPSULE | Freq: Every evening | ORAL | 2 refills | Status: DC | PRN

## 2019-02-26 NOTE — Progress Notes (Signed)
Virtual Visit via Telephone Note  I connected with Catherine Mclean on 02/26/19 at  1:40 PM EST by telephone and verified that I am speaking with the correct person using two identifiers.  Location: Patient: home Provider: office   I discussed the limitations, risks, security and privacy concerns of performing an evaluation and management service by telephone and the availability of in person appointments. I also discussed with the patient that there may be a patient responsible charge related to this service. The patient expressed understanding and agreed to proceed.   History of Present Illness: F/u chronic problems, c/o poor sleep, wants alternative manahgement for sleep, difficulty staying asleep , on average 5 hours. Denies recent fever or chills. Denies sinus pressure, nasal congestion, ear pain or sore throat. Denies chest congestion, productive cough or wheezing. Denies chest pains, palpitations and leg swelling Denies abdominal pain, nausea, vomiting,diarrhea or constipation.   Denies dysuria, frequency, hesitancy or incontinence. Denies joint pain, swelling and limitation in mobility. Denies headaches, seizures, numbness, or tingling. Denies skin break down or rash.       Observations/Objective: BP 124/78   Pulse 61   Resp 18   Ht 5\' 2"  (1.575 m)   Wt 146 lb (66.2 kg)   BMI 26.70 kg/m  Good communication with no confusion and intact memory. Alert and oriented x 3 No signs of respiratory distress during speech    Assessment and Plan: Essential hypertension Controlled, no change in medication DASH diet and commitment to daily physical activity for a minimum of 30 minutes discussed and encouraged, as a part of hypertension management. The importance of attaining a healthy weight is also discussed.  BP/Weight 02/26/2019 10/02/2018 04/12/2018 04/04/2018 03/07/2018 [id number]  Systolic BP [id number] [id number] [id number] [id number]  Diastolic BP 78 78 75 74 83 68 82  Wt. (Lbs)  146 146 148 148 150 151.12 150  BMI 26.7 26.7 27.07 27.07 26.57 27.64 27.44       Smoking 1/2 pack a day or less Asked:confirms currently smokes 1 to 2 cigarettes/ day Assess: Willing to quit , cutting backunwilling to set a quit date Advise: needs to QUIT to reduce risk of cancer, cardio and cerebrovascular disease Assist: counseled for 5 minutes and literature provided Arrange: follow up in 3 months   Overweight (BMI 25.0-29.9)  Patient re-educated about  the importance of commitment to a  minimum of 150 minutes of exercise per week as able.  The importance of healthy food choices with portion control discussed, as well as eating regularly and within a 12 hour window most days. The need to choose "clean , green" food 50 to 75% of the time is discussed, as well as to make water the primary drink and set a goal of 64 ounces water daily.    Weight /BMI 02/26/2019 10/02/2018 04/12/2018  WEIGHT 146 lb 146 lb 148 lb  HEIGHT 5\' 2"  5\' 2"  5\' 2"   BMI 26.7 kg/m2 26.7 kg/m2 27.07 kg/m2      Insomnia Sleep hygiene reviewed and written information offered also. Prescription sent for  medication needed.     Follow Up Instructions:    I discussed the assessment and treatment plan with the patient. The patient was provided an opportunity to ask questions and all were answered. The patient agreed with the plan and demonstrated an understanding of the instructions.   The patient was advised to call back or seek an in-person evaluation if the symptoms worsen or if the condition fails  to improve as anticipated.  I provided 21 minutes of non-face-to-face time during this encounter.   Tula Nakayama, MD

## 2019-02-26 NOTE — Patient Instructions (Addendum)
F/U with MD , re evaluate insomnia  3rd week in februaury, call if you ned me sooner  Please schedule mammogram at checkout, appt 3 pm or after please  Please stop smoking once more, you report smoking 1 / day  Mew for sleep is Restoril and as needed xanax.  Please get fasting lipid chem 7 and EGFR , 1 week before follow up     Insomnia Insomnia is a sleep disorder that makes it difficult to fall asleep or stay asleep. Insomnia can cause fatigue, low energy, difficulty concentrating, mood swings, and poor performance at work or school. There are three different ways to classify insomnia:  Difficulty falling asleep.  Difficulty staying asleep.  Waking up too early in the morning. Any type of insomnia can be long-term (chronic) or short-term (acute). Both are common. Short-term insomnia usually lasts for three months or less. Chronic insomnia occurs at least three times a week for longer than three months. What are the causes? Insomnia may be caused by another condition, situation, or substance, such as:  Anxiety.  Certain medicines.  Gastroesophageal reflux disease (GERD) or other gastrointestinal conditions.  Asthma or other breathing conditions.  Restless legs syndrome, sleep apnea, or other sleep disorders.  Chronic pain.  Menopause.  Stroke.  Abuse of alcohol, tobacco, or illegal drugs.  Mental health conditions, such as depression.  Caffeine.  Neurological disorders, such as Alzheimer's disease.  An overactive thyroid (hyperthyroidism). Sometimes, the cause of insomnia may not be known. What increases the risk? Risk factors for insomnia include:  Gender. Women are affected more often than men.  Age. Insomnia is more common as you get older.  Stress.  Lack of exercise.  Irregular work schedule or working night shifts.  Traveling between different time zones.  Certain medical and mental health conditions. What are the signs or symptoms? If you  have insomnia, the main symptom is having trouble falling asleep or having trouble staying asleep. This may lead to other symptoms, such as:  Feeling fatigued or having low energy.  Feeling nervous about going to sleep.  Not feeling rested in the morning.  Having trouble concentrating.  Feeling irritable, anxious, or depressed. How is this diagnosed? This condition may be diagnosed based on:  Your symptoms and medical history. Your health care provider may ask about: ? Your sleep habits. ? Any medical conditions you have. ? Your mental health.  A physical exam. How is this treated? Treatment for insomnia depends on the cause. Treatment may focus on treating an underlying condition that is causing insomnia. Treatment may also include:  Medicines to help you sleep.  Counseling or therapy.  Lifestyle adjustments to help you sleep better. Follow these instructions at home: Eating and drinking   Limit or avoid alcohol, caffeinated beverages, and cigarettes, especially close to bedtime. These can disrupt your sleep.  Do not eat a large meal or eat spicy foods right before bedtime. This can lead to digestive discomfort that can make it hard for you to sleep. Sleep habits   Keep a sleep diary to help you and your health care provider figure out what could be causing your insomnia. Write down: ? When you sleep. ? When you wake up during the night. ? How well you sleep. ? How rested you feel the next day. ? Any side effects of medicines you are taking. ? What you eat and drink.  Make your bedroom a dark, comfortable place where it is easy to fall asleep. ? Put  up shades or blackout curtains to block light from outside. ? Use a white noise machine to block noise. ? Keep the temperature cool.  Limit screen use before bedtime. This includes: ? Watching TV. ? Using your smartphone, tablet, or computer.  Stick to a routine that includes going to bed and waking up at the same  times every day and night. This can help you fall asleep faster. Consider making a quiet activity, such as reading, part of your nighttime routine.  Try to avoid taking naps during the day so that you sleep better at night.  Get out of bed if you are still awake after 15 minutes of trying to sleep. Keep the lights down, but try reading or doing a quiet activity. When you feel sleepy, go back to bed. General instructions  Take over-the-counter and prescription medicines only as told by your health care provider.  Exercise regularly, as told by your health care provider. Avoid exercise starting several hours before bedtime.  Use relaxation techniques to manage stress. Ask your health care provider to suggest some techniques that may work well for you. These may include: ? Breathing exercises. ? Routines to release muscle tension. ? Visualizing peaceful scenes.  Make sure that you drive carefully. Avoid driving if you feel very sleepy.  Keep all follow-up visits as told by your health care provider. This is important. Contact a health care provider if:  You are tired throughout the day.  You have trouble in your daily routine due to sleepiness.  You continue to have sleep problems, or your sleep problems get worse. Get help right away if:  You have serious thoughts about hurting yourself or someone else. If you ever feel like you may hurt yourself or others, or have thoughts about taking your own life, get help right away. You can go to your nearest emergency department or call:  Your local emergency services (911 in the [place]  A suicide crisis helpline, such as the [facility] at (214)571-1484. This is open 24 hours a day. Summary  Insomnia is a sleep disorder that makes it difficult to fall asleep or stay asleep.  Insomnia can be long-term (chronic) or short-term (acute).  Treatment for insomnia depends on the cause. Treatment may focus on treating an  underlying condition that is causing insomnia.  Keep a sleep diary to help you and your health care provider figure out what could be causing your insomnia. This information is not intended to replace advice given to you by your health care provider. Make sure you discuss any questions you have with your health care provider. Document Revised: 01/21/2017 Document Reviewed: 11/18/2016 Elsevier Patient Education  2020 [employer].

## 2019-03-02 ENCOUNTER — Encounter: Payer: Self-pay | Admitting: Family Medicine

## 2019-03-02 NOTE — Assessment & Plan Note (Signed)
Controlled, no change in medication DASH diet and commitment to daily physical activity for a minimum of 30 minutes discussed and encouraged, as a part of hypertension management. The importance of attaining a healthy weight is also discussed.  BP/Weight 02/26/2019 10/02/2018 04/12/2018 04/04/2018 03/07/2018 [id number]  Systolic BP A999333 [id number] [id number] 123456 [id number] 0000000  Diastolic BP 78 78 75 74 83 68 82  Wt. (Lbs) 146 146 148 148 150 151.12 150  BMI 26.7 26.7 27.07 27.07 26.57 27.64 27.44

## 2019-03-02 NOTE — Assessment & Plan Note (Signed)
Sleep hygiene reviewed and written information offered also. Prescription sent for  medication needed.  

## 2019-03-02 NOTE — Assessment & Plan Note (Signed)
Asked:confirms currently smokes 1 to 2 cigarettes/ day Assess: Willing to quit , cutting backunwilling to set a quit date Advise: needs to QUIT to reduce risk of cancer, cardio and cerebrovascular disease Assist: counseled for 5 minutes and literature provided Arrange: follow up in 3 months

## 2019-03-02 NOTE — Assessment & Plan Note (Signed)
  Patient re-educated about  the importance of commitment to a  minimum of 150 minutes of exercise per week as able.  The importance of healthy food choices with portion control discussed, as well as eating regularly and within a 12 hour window most days. The need to choose "clean , green" food 50 to 75% of the time is discussed, as well as to make water the primary drink and set a goal of 64 ounces water daily.    Weight /BMI 02/26/2019 10/02/2018 04/12/2018  WEIGHT 146 lb 146 lb 148 lb  HEIGHT 5\' 2"  5\' 2"  5\' 2"   BMI 26.7 kg/m2 26.7 kg/m2 27.07 kg/m2

## 2019-03-05 ENCOUNTER — Ambulatory Visit (HOSPITAL_COMMUNITY)
Admission: RE | Admit: 2019-03-05 | Discharge: 2019-03-05 | Disposition: A | Discharge status: Home / Self Care | Payer: BLUE CROSS/BLUE SHIELD | Source: Ambulatory Visit | Attending: Family Medicine | Admitting: Family Medicine

## 2019-03-05 ENCOUNTER — Other Ambulatory Visit: Payer: Self-pay

## 2019-03-05 DIAGNOSIS — Z1231 Encounter for screening mammogram for malignant neoplasm of breast: Secondary | ICD-10-CM | POA: Insufficient documentation

## 2019-04-10 ENCOUNTER — Ambulatory Visit: Payer: BLUE CROSS/BLUE SHIELD | Admitting: Family Medicine

## 2019-04-11 ENCOUNTER — Other Ambulatory Visit: Payer: Self-pay | Admitting: Family Medicine

## 2019-04-17 ENCOUNTER — Ambulatory Visit: Payer: BLUE CROSS/BLUE SHIELD | Admitting: Family Medicine

## 2019-04-20 DIAGNOSIS — I10 Essential (primary) hypertension: Secondary | ICD-10-CM | POA: Diagnosis not present

## 2019-04-20 DIAGNOSIS — E785 Hyperlipidemia, unspecified: Secondary | ICD-10-CM | POA: Diagnosis not present

## 2019-04-21 LAB — LIPID PANEL
Cholesterol: 204 mg/dL — ABNORMAL HIGH (ref <200)
HDL: 53 mg/dL (ref >=50)
LDL Cholesterol (Calc): 132 mg/dL (calc) — ABNORMAL HIGH
Non-HDL Cholesterol (Calc): 151 mg/dL (calc) — ABNORMAL HIGH (ref <130)
Total CHOL/HDL Ratio: 3.8 (calc) (ref <5.0)
Triglycerides: 89 mg/dL (ref <150)

## 2019-04-21 LAB — BASIC METABOLIC PANEL WITH GFR
BUN: 9 mg/dL (ref 7–25)
CO2: 27 mmol/L (ref 20–32)
Calcium: 9.8 mg/dL (ref 8.6–10.4)
Chloride: 106 mmol/L (ref 98–110)
Creat: 0.85 mg/dL (ref 0.50–0.99)
GFR, Est African American: 86 mL/min/{1.73_m2} (ref >=60)
GFR, Est Non African American: 74 mL/min/{1.73_m2} (ref >=60)
Glucose, Bld: 86 mg/dL (ref 65–99)
Potassium: 3.9 mmol/L (ref 3.5–5.3)
Sodium: 140 mmol/L (ref 135–146)

## 2019-04-22 ENCOUNTER — Encounter: Payer: Self-pay | Admitting: Family Medicine

## 2019-04-25 ENCOUNTER — Encounter: Payer: Self-pay | Admitting: Family Medicine

## 2019-04-25 ENCOUNTER — Ambulatory Visit (INDEPENDENT_AMBULATORY_CARE_PROVIDER_SITE_OTHER): Payer: BLUE CROSS/BLUE SHIELD | Admitting: Family Medicine

## 2019-04-25 ENCOUNTER — Other Ambulatory Visit: Payer: Self-pay

## 2019-04-25 VITALS — BP 124/78 | Ht 62.0 in | Wt 146.0 lb

## 2019-04-25 DIAGNOSIS — M542 Cervicalgia: Secondary | ICD-10-CM | POA: Diagnosis not present

## 2019-04-25 DIAGNOSIS — F5101 Primary insomnia: Secondary | ICD-10-CM

## 2019-04-25 DIAGNOSIS — M25562 Pain in left knee: Secondary | ICD-10-CM

## 2019-04-25 DIAGNOSIS — I10 Essential (primary) hypertension: Secondary | ICD-10-CM | POA: Diagnosis not present

## 2019-04-25 DIAGNOSIS — G8929 Other chronic pain: Secondary | ICD-10-CM

## 2019-04-25 MED ORDER — BISOPROLOL-HYDROCHLOROTHIAZIDE 5-6.25 MG PO TABS: 1.0000 | ORAL_TABLET | Freq: Every day | ORAL | 1 refills | Status: DC

## 2019-04-25 MED ORDER — TIZANIDINE HCL 4 MG PO TABS: ORAL_TABLET | ORAL | 3 refills | Status: DC

## 2019-04-25 MED ORDER — METHYLPREDNISOLONE ACETATE 80 MG/ML IJ SUSP
80.0000 mg | Freq: Once | INTRAMUSCULAR | Status: AC
  Administered 2019-04-25: 80 mg via INTRAMUSCULAR

## 2019-04-25 MED ORDER — PREDNISONE 10 MG PO TABS: ORAL_TABLET | ORAL | 0 refills | Status: DC

## 2019-04-25 NOTE — Patient Instructions (Addendum)
Annual physical exam end August when diue , call if you need me  Sooner  DepoMedrol 80 mg IM in office today followed by short course of o prednisone for left knee pain, if unsuccessful , call for referral to orthopedics  Congrats on remaining nicotine free  Thankful sleep is good on current medication  Please cut back on fried and fatty foods, cholesterol is improving but still not at goal

## 2019-04-28 DIAGNOSIS — M25562 Pain in left knee: Secondary | ICD-10-CM | POA: Insufficient documentation

## 2019-04-28 NOTE — Assessment & Plan Note (Signed)
Controlled, no change in medication DASH diet and commitment to daily physical activity for a minimum of 30 minutes discussed and encouraged, as a part of hypertension management. The importance of attaining a healthy weight is also discussed.  BP/Weight 04/25/2019 02/26/2019 10/02/2018 04/12/2018 04/04/2018 [id number] [id number]  Systolic BP A999333 A999333 A999333 [id number] A999333 123456 123456  Diastolic BP 78 78 78 75 74 83 68  Wt. (Lbs) 146 146 146 148 148 150 151.12  BMI 26.7 26.7 26.7 27.07 27.07 26.57 27.64

## 2019-04-28 NOTE — Assessment & Plan Note (Signed)
Excellent response to current med, continue same Sleep hygiene reviewed and written information offered also. Prescription sent for  medication needed.

## 2019-04-28 NOTE — Assessment & Plan Note (Signed)
Uncontrolled, prednisone and depo medrol

## 2019-04-28 NOTE — Assessment & Plan Note (Signed)
Currently uncontrolled, depo medrol 80 mg iM followed by oral prednisone for a short course

## 2019-04-28 NOTE — Progress Notes (Signed)
Virtual Visit via Telephone Note  I connected with Catherine Mclean on 04/28/19 at  3:00 PM EST by telephone and verified that I am speaking with the correct person using two identifiers.  Location: Patient: home, however came in for depo medrol injection after the phon visit on the same day Provider: office   I discussed the limitations, risks, security and privacy concerns of performing an evaluation and management service by telephone and the availability of in person appointments. I also discussed with the patient that there may be a patient responsible charge related to this service. The patient expressed understanding and agreed to proceed.   History of Present Illness: F/U chronic problems, medication review, and refill medication when necessary. Review most recent labs and order labs which are due Review preventive health and update with necessary referrals or immunizations as indicated Denies recent fever or chills. Denies sinus pressure, nasal congestion, ear pain or sore throat. Denies chest congestion, productive cough or wheezing. Denies chest pains, palpitations and leg swelling Denies abdominal pain, nausea, vomiting,diarrhea or constipation.   Denies dysuria, frequency, hesitancy or incontinence. C/o pain in neck and left knee wants injection in office before returning to ortho as he has done in the past Denies headaches, seizures, numbness, or tingling. Denies depression, anxiety or insomnia. Denies skin break down or rash.       Observations/Objective: BP 124/78   Ht 5\' 2"  (1.575 m)   Wt 146 lb (66.2 kg)   BMI 26.70 kg/m  Good communication with no confusion and intact memory. Alert and oriented x 3 No signs of respiratory distress during speech    Assessment and Plan: Essential hypertension Controlled, no change in medication DASH diet and commitment to daily physical activity for a minimum of 30 minutes discussed and encouraged, as a part of hypertension  management. The importance of attaining a healthy weight is also discussed.  BP/Weight 04/25/2019 02/26/2019 10/02/2018 04/12/2018 04/04/2018 [id number]  Systolic BP A999333 A999333 A999333 [id number] A999333 [id number] 123456  Diastolic BP 78 78 78 75 74 83 68  Wt. (Lbs) 146 146 146 148 148 150 151.12  BMI 26.7 26.7 26.7 27.07 27.07 26.57 27.64       Insomnia Excellent response to current med, continue same Sleep hygiene reviewed and written information offered also. Prescription sent for  medication needed.   Left knee pain Currently uncontrolled, depo medrol 80 mg iM followed by oral prednisone for a short course  Neck pain Uncontrolled, prednisone and depo medrol    Follow Up Instructions:    I discussed the assessment and treatment plan with the patient. The patient was provided an opportunity to ask questions and all were answered. The patient agreed with the plan and demonstrated an understanding of the instructions.   The patient was advised to call back or seek an in-person evaluation if the symptoms worsen or if the condition fails to improve as anticipated.  I provided 25 minutes of non-face-to-face time during this encounter.   Tula Nakayama, MD

## 2019-05-01 ENCOUNTER — Telehealth: Payer: BLUE CROSS/BLUE SHIELD | Admitting: Family Medicine

## 2019-05-03 DIAGNOSIS — Z23 Encounter for immunization: Secondary | ICD-10-CM | POA: Diagnosis not present

## 2019-05-11 ENCOUNTER — Other Ambulatory Visit: Payer: Self-pay | Admitting: Family Medicine

## 2019-05-29 DIAGNOSIS — Z23 Encounter for immunization: Secondary | ICD-10-CM | POA: Diagnosis not present

## 2019-06-14 ENCOUNTER — Other Ambulatory Visit: Payer: Self-pay | Admitting: Family Medicine

## 2019-06-14 MED ORDER — TEMAZEPAM 30 MG PO CAPS: ORAL_CAPSULE | ORAL | 3 refills | Status: DC

## 2019-06-26 ENCOUNTER — Ambulatory Visit: Payer: BLUE CROSS/BLUE SHIELD

## 2019-06-26 ENCOUNTER — Other Ambulatory Visit: Payer: Self-pay

## 2019-06-26 ENCOUNTER — Other Ambulatory Visit (HOSPITAL_COMMUNITY)
Admission: RE | Admit: 2019-06-26 | Discharge: 2019-06-26 | Disposition: A | Discharge status: Home / Self Care | Payer: BLUE CROSS/BLUE SHIELD | Source: Other Acute Inpatient Hospital | Attending: Family Medicine | Admitting: Family Medicine

## 2019-06-26 DIAGNOSIS — Z79899 Other long term (current) drug therapy: Secondary | ICD-10-CM | POA: Insufficient documentation

## 2019-07-01 LAB — MISC LABCORP TEST (SEND OUT): Labcorp test code: 738526

## 2019-07-03 ENCOUNTER — Other Ambulatory Visit: Payer: Self-pay

## 2019-07-03 ENCOUNTER — Encounter: Payer: Self-pay | Admitting: Family Medicine

## 2019-07-03 ENCOUNTER — Telehealth (INDEPENDENT_AMBULATORY_CARE_PROVIDER_SITE_OTHER): Payer: BLUE CROSS/BLUE SHIELD | Admitting: Family Medicine

## 2019-07-03 VITALS — BP 124/78 | Ht 62.0 in | Wt 146.0 lb

## 2019-07-03 DIAGNOSIS — Z79899 Other long term (current) drug therapy: Secondary | ICD-10-CM

## 2019-07-03 DIAGNOSIS — F5101 Primary insomnia: Secondary | ICD-10-CM

## 2019-07-03 DIAGNOSIS — M4722 Other spondylosis with radiculopathy, cervical region: Secondary | ICD-10-CM

## 2019-07-03 DIAGNOSIS — M2352 Chronic instability of knee, left knee: Secondary | ICD-10-CM | POA: Diagnosis not present

## 2019-07-03 DIAGNOSIS — M25512 Pain in left shoulder: Secondary | ICD-10-CM | POA: Diagnosis not present

## 2019-07-03 MED ORDER — TEMAZEPAM 30 MG PO CAPS: ORAL_CAPSULE | ORAL | 1 refills | Status: DC

## 2019-07-03 MED ORDER — ALPRAZOLAM 0.25 MG PO TABS: ORAL_TABLET | ORAL | 0 refills | Status: DC

## 2019-07-03 NOTE — Progress Notes (Signed)
Virtual Visit via Telephone Note   This visit type was conducted due to national recommendations for restrictions regarding the COVID-19 Pandemic (e.g. social distancing) in an effort to limit this patient's exposure and mitigate transmission in our community.  Due to her co-morbid illnesses, this patient is at least at moderate risk for complications without adequate follow up.  This format is felt to be most appropriate for this patient at this time.  The patient did not have access to video technology/had technical difficulties with video requiring transitioning to audio format only (telephone).  All issues noted in this document were discussed and addressed.  No physical exam could be performed with this format.   Evaluation Performed:  Follow-up visit  Date:  07/06/2019   ID:  Catherine Mclean, DOB 01-31-1961, MRN [id number]  Patient Location: Home Provider Location: Office  Location of Patient: Home Location of Provider: Telehealth Consent was obtain for visit to be over via telehealth. I verified that I am speaking with the correct person using two identifiers.  PCP:  Catherine Helper, MD   Chief Complaint: Insomnia, neck and knee pain  History of Present Illness:    Catherine Mclean is a 59 y.o. 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  She has a very significant history for joint and Ortho type pain.  Most recently she reports that she is having left knee, left shoulder discomfort and at times neck pain.  She reports sometimes prednisone helps for her arthritis flares.  She describes this discomfort as throbbing and aching.  Activity can make it worse especially when she is up on her feet for 8 hours or more.  Ice and heat does not really do much good for her.  She reports that is discomforting all the time. Today is a 6 out of 10.  She is open for Ortho  referral.  Additionally she left a urine last week for refill of Xanax.  The patient does not have symptoms concerning for COVID-19 infection (fever, chills, cough, or new shortness of breath).   Past Medical, Surgical, Social History, Allergies, and Medications have been Reviewed.  Past Medical History:  Diagnosis Date  . Anxiety   . Depression   . Hypertension    Past Surgical History:  Procedure Laterality Date  . BIOPSY  04/12/2018   Procedure: BIOPSY;  Surgeon: Catherine Houston, MD;  [facility]: [facility];  Service: Endoscopy;;  appendicial orffice mucosa  . COLONOSCOPY N/A 04/12/2018   Procedure: COLONOSCOPY;  Surgeon: Catherine Houston, MD;  Location: [facility];  Service: Endoscopy;  Laterality: N/A;  930  . OVARY SURGERY    . POLYPECTOMY  04/12/2018   Procedure: POLYPECTOMY;  Surgeon: Catherine Houston, MD;  Location: [facility];  Service: Endoscopy;;  splenic flexure     Current Meds  Medication Sig  . ALPRAZolam (XANAX) 0.25 MG tablet Take one tablet by mouth ,if needed, for anxiety and insomnia  . bisoprolol-hydrochlorothiazide (ZIAC) 5-6.25 MG tablet Take 1 tablet by mouth daily.  . temazepam (RESTORIL) 30 MG capsule TAKE 1 CAPSULE(30 MG) BY MOUTH AT BEDTIME AS NEEDED FOR SLEEP  . tiZANidine (ZANAFLEX) 4 MG tablet TAKE 1 TABLET BY MOUTH TWICE DAILY FOR NECK SPASM  . [DISCONTINUED] ALPRAZolam (XANAX) 0.25 MG tablet Take one tablet by mouth ,if needed, for anxiety and insomnia  . [DISCONTINUED] temazepam (RESTORIL) 30 MG capsule TAKE 1  CAPSULE(30 MG) BY MOUTH AT BEDTIME AS NEEDED FOR SLEEP   Current Facility-Administered Medications for the 07/03/19 encounter (Video Visit) with Catherine Mayo, NP  Medication  . Influenza (>/= 3 years) inactive virus vaccine (FLVIRIN/FLUZONE) injection SUSP 0.5 mL     Allergies:   Hydrochlorothiazide w-triamterene   ROS:   Please see the history of present illness.    All other systems reviewed and are negative.   Labs/Other  Tests and Data Reviewed:    Recent Labs: 10/16/2018: ALT 13; Hemoglobin 13.6; Platelets 210; TSH 1.20 04/20/2019: BUN 9; Creat 0.85; Potassium 3.9; Sodium 140   Recent Lipid Panel Lab Results  Component Value Date/Time   CHOL 204 (H) 04/20/2019 08:25 AM   TRIG 89 04/20/2019 08:25 AM   HDL 53 04/20/2019 08:25 AM   CHOLHDL 3.8 04/20/2019 08:25 AM   LDLCALC 132 (H) 04/20/2019 08:25 AM    Wt Readings from Last 3 Encounters:  07/03/19 146 lb (66.2 kg)  04/25/19 146 lb (66.2 kg)  02/26/19 146 lb (66.2 kg)     Objective:    Vital Signs:  BP 124/78   Ht 5\' 2"  (1.575 m)   Wt 146 lb (66.2 kg)   BMI 26.70 kg/m    VITAL SIGNS:  reviewed GEN:  Alert and oriented RESPIRATORY:  No shortness of breath noted in conversation PSYCH:  Normal affect and mood  ASSESSMENT & PLAN:     1. Primary insomnia  - temazepam (RESTORIL) 30 MG capsule; TAKE 1 CAPSULE(30 MG) BY MOUTH AT BEDTIME AS NEEDED FOR SLEEP  Dispense: 30 capsule; Refill: 1 - ALPRAZolam (XANAX) 0.25 MG tablet; Take one tablet by mouth ,if needed, for anxiety and insomnia  Dispense: 15 tablet; Refill: 0  2. Recurrent left knee instability  - Ambulatory referral to Orthopedic Surgery  3. Osteoarthritis of spine with radiculopathy, cervical region  - Ambulatory referral to Orthopedic Surgery  4. Acute pain of left shoulder   Time:   Today, I have spent 10 minutes with the patient with telehealth technology discussing the above problems.     Medication Adjustments/Labs and Tests Ordered: Current medicines are reviewed at length with the patient today.  Concerns regarding medicines are outlined above.   Tests Ordered: Orders Placed This Encounter  Procedures  . Ambulatory referral to Orthopedic Surgery    Medication Changes: Meds ordered this encounter  Medications  . temazepam (RESTORIL) 30 MG capsule    Sig: TAKE 1 CAPSULE(30 MG) BY MOUTH AT BEDTIME AS NEEDED FOR SLEEP    Dispense:  30 capsule    Refill:  1     Order Specific Question:   Supervising Provider    Answer:   SIMPSON, Mclean E [id number]  . ALPRAZolam (XANAX) 0.25 MG tablet    Sig: Take one tablet by mouth ,if needed, for anxiety and insomnia    Dispense:  15 tablet    Refill:  0    Fifteen tablets to last 30 days    Order Specific Question:   Supervising Provider    Answer:   Catherine Mclean [id number]    Disposition:  Follow up 10/10/2019 Signed, Catherine Mayo, NP  07/06/2019 3:10 PM     [facility]

## 2019-07-06 ENCOUNTER — Encounter: Payer: Self-pay | Admitting: Family Medicine

## 2019-07-06 DIAGNOSIS — Z79899 Other long term (current) drug therapy: Secondary | ICD-10-CM | POA: Insufficient documentation

## 2019-07-06 NOTE — Patient Instructions (Addendum)
I appreciate the opportunity to provide you with care for your health and wellness. Today we discussed: Insomnia and knee pain  Follow up: As scheduled in August  No labs or referrals today  Please continue to practice social distancing to keep you, your family, and our community safe.  If you must go out, please wear a mask and practice good handwashing.  It was a pleasure to see you and I look forward to continuing to work together on your health and well-being. Please do not hesitate to call the office if you need care or have questions about your care.  Have a wonderful day and week. With Gratitude, Cherly Beach, DNP, AGNP-BC

## 2019-07-06 NOTE — Assessment & Plan Note (Signed)
Continues to have knee instability and discomfort referral to Ortho.

## 2019-07-06 NOTE — Assessment & Plan Note (Signed)
Use of Restoril is very helpful.  Reviewed sleep hygiene again.  Prescription sent back in.

## 2019-07-06 NOTE — Assessment & Plan Note (Signed)
Continues to have pain referral to orthopedics made.

## 2019-07-06 NOTE — Assessment & Plan Note (Signed)
Pain in shoulder referral to Ortho.

## 2019-07-06 NOTE — Assessment & Plan Note (Signed)
Updated controlled substance secondary to change in provider.  PDMP was reviewed prior and compliance confirmed with this and urine drug screen.  Refill made today.

## 2019-09-06 ENCOUNTER — Other Ambulatory Visit: Payer: Self-pay | Admitting: Family Medicine

## 2019-10-10 ENCOUNTER — Encounter: Payer: BLUE CROSS/BLUE SHIELD | Admitting: Family Medicine

## 2019-10-22 ENCOUNTER — Other Ambulatory Visit: Payer: Self-pay

## 2019-10-22 ENCOUNTER — Encounter: Payer: Self-pay | Admitting: Family Medicine

## 2019-10-22 ENCOUNTER — Ambulatory Visit (INDEPENDENT_AMBULATORY_CARE_PROVIDER_SITE_OTHER): Payer: BLUE CROSS/BLUE SHIELD | Admitting: Family Medicine

## 2019-10-22 VITALS — BP 139/67 | HR 48 | Temp 97.4°F | Resp 18 | Ht 62.0 in | Wt 145.4 lb

## 2019-10-22 DIAGNOSIS — Z0000 Encounter for general adult medical examination without abnormal findings: Secondary | ICD-10-CM

## 2019-10-22 DIAGNOSIS — E559 Vitamin D deficiency, unspecified: Secondary | ICD-10-CM

## 2019-10-22 DIAGNOSIS — E663 Overweight: Secondary | ICD-10-CM

## 2019-10-22 DIAGNOSIS — R001 Bradycardia, unspecified: Secondary | ICD-10-CM | POA: Insufficient documentation

## 2019-10-22 DIAGNOSIS — Z23 Encounter for immunization: Secondary | ICD-10-CM

## 2019-10-22 DIAGNOSIS — I10 Essential (primary) hypertension: Secondary | ICD-10-CM | POA: Diagnosis not present

## 2019-10-22 DIAGNOSIS — E785 Hyperlipidemia, unspecified: Secondary | ICD-10-CM

## 2019-10-22 DIAGNOSIS — R7989 Other specified abnormal findings of blood chemistry: Secondary | ICD-10-CM

## 2019-10-22 MED ORDER — HYDROCHLOROTHIAZIDE 12.5 MG PO CAPS: 12.5000 mg | ORAL_CAPSULE | Freq: Every day | ORAL | 3 refills | Status: DC

## 2019-10-22 NOTE — Patient Instructions (Addendum)
Physical exam with MD in 8 to 10 weeks, call if you need me sooner  STOP ziac, new for blood pressure is HCTZ 12.5 mg ione daily and potassium 10 meq one daily  Fasting labs this week, CBC, lipid, cmp and eGFr, TSH and vit D  If you feel light headed, extra tired , or short of breath , please go to the ED, heart rate is lower than normal and lower that what it has been  Thanks for choosing [facility], we consider it a privelige to serve you.

## 2019-10-22 NOTE — Assessment & Plan Note (Addendum)
Asymptomatic and no new medications, stop ziac, change to HCTZ and refer to cardiology, urgent

## 2019-10-28 NOTE — Assessment & Plan Note (Signed)

## 2019-10-28 NOTE — Progress Notes (Signed)
    Catherine Mclean     MRN: [id number]      DOB: 1960/08/27  HPI: Patient is in for annual physical exam. Bradycardia which is new is noted incidently. There have been no changes in medications, and she is asymptomatic Recent labs, if available are reviewed. Immunization is reviewed , and  updated  PE: Pleasant  female, alert and oriented x 3, in no cardio-pulmonary distress. Afebrile. HEENT No facial trauma or asymetry. Sinuses non tender.  Extra occullar muscles intact.. External ears normal, . Neck: supple, no adenopathy,JVD or thyromegaly.No bruits.  Chest: Clear to ascultation bilaterally.No crackles or wheezes. Non tender to palpation  Cardiovascular system; Heart sounds normal,  S1 and  S2 ,no S3.  No murmur, or thrill. Apical beat not displaced Peripheral pulses normal. EKG: sinus  Bradycardia, rate 44, no ischemia or LVH  Abdomen: Soft, non tender, no organomegaly or masses. No bruits. Bowel sounds normal. No guarding, tenderness or rebound.      Musculoskeletal exam: Full ROM of spine, hips , shoulders and knees. No deformity ,swelling or crepitus noted. No muscle wasting or atrophy.   Neurologic: Cranial nerves 2 to 12 intact. Power, tone ,sensation and reflexes normal throughout. No disturbance in gait. No tremor.  Skin: Intact, no ulceration, erythema , scaling or rash noted. Pigmentation normal throughout  Psych; Normal mood and affect. Judgement and concentration normal   Assessment & Plan:  Bradycardia on ECG Asymptomatic and no new medications, stop ziac, change to HCTZ and refer to cardiology, urgent  Annual physical exam Annual exam as documented. Counseling done  re healthy lifestyle involving commitment to 150 minutes exercise per week, heart healthy diet, and attaining healthy weight.The importance of adequate sleep also discussed. Regular seat belt use and home safety, is also discussed. Changes in health habits are decided on by  the patient with goals and time frames  set for achieving them. Immunization and cancer screening needs are specifically addressed at this visit.   Overweight (BMI 25.0-29.9)  Patient re-educated about  the importance of commitment to a  minimum of 150 minutes of exercise per week as able.  The importance of healthy food choices with portion control discussed, as well as eating regularly and within a 12 hour window most days. The need to choose "clean , green" food 50 to 75% of the time is discussed, as well as to make water the primary drink and set a goal of 64 ounces water daily.    Weight /BMI 10/22/2019 07/03/2019 04/25/2019  WEIGHT 145 lb 6.4 oz 146 lb 146 lb  HEIGHT 5\' 2"  5\' 2"  5\' 2"   BMI 26.59 kg/m2 26.7 kg/m2 26.7 kg/m2

## 2019-10-28 NOTE — Assessment & Plan Note (Signed)
  Patient re-educated about  the importance of commitment to a  minimum of 150 minutes of exercise per week as able.  The importance of healthy food choices with portion control discussed, as well as eating regularly and within a 12 hour window most days. The need to choose "clean , green" food 50 to 75% of the time is discussed, as well as to make water the primary drink and set a goal of 64 ounces water daily.    Weight /BMI 10/22/2019 07/03/2019 04/25/2019  WEIGHT 145 lb 6.4 oz 146 lb 146 lb  HEIGHT 5\' 2"  5\' 2"  5\' 2"   BMI 26.59 kg/m2 26.7 kg/m2 26.7 kg/m2

## 2019-10-30 ENCOUNTER — Other Ambulatory Visit: Payer: Self-pay | Admitting: Family Medicine

## 2019-10-30 ENCOUNTER — Telehealth: Payer: Self-pay

## 2019-10-30 ENCOUNTER — Other Ambulatory Visit: Payer: Self-pay

## 2019-10-30 MED ORDER — BUSPIRONE HCL 5 MG PO TABS: ORAL_TABLET | ORAL | 3 refills | Status: DC

## 2019-10-30 MED ORDER — TEMAZEPAM 30 MG PO CAPS: 30.0000 mg | ORAL_CAPSULE | Freq: Every evening | ORAL | 5 refills | Status: DC | PRN

## 2019-10-30 MED ORDER — TIZANIDINE HCL 4 MG PO TABS
ORAL_TABLET | ORAL | 4 refills | Status: DC
Start: 2019-10-30 — End: 2020-04-21

## 2019-10-30 NOTE — Telephone Encounter (Signed)
Med sent in, she needs a January appointment scheduled for f/u, none in the system, thanks

## 2019-10-30 NOTE — Telephone Encounter (Signed)
Patient agrees to try buspar

## 2019-10-30 NOTE — Telephone Encounter (Signed)
Med is prescribed

## 2019-10-30 NOTE — Telephone Encounter (Signed)
Appt scheduled for 1/10

## 2019-10-30 NOTE — Telephone Encounter (Signed)
States she has been out of her temazepam for a few days and needs it sent to the pharmacy

## 2019-10-30 NOTE — Telephone Encounter (Signed)
HAS NOT BEEN ON THIS REGULARLY, I RECOMMEND BUSPAR ONE AT BEDTIME FOR ANXIETY INSTEAD OF Duanne Moron

## 2019-10-30 NOTE — Telephone Encounter (Signed)
When I called her back she is also wanting the xanax refilled as well.

## 2019-10-30 NOTE — Telephone Encounter (Signed)
Can u let patient know and if she agrees dr Moshe Cipro has to send the med in or clarify the dose

## 2019-11-20 ENCOUNTER — Other Ambulatory Visit: Payer: Self-pay | Admitting: Family Medicine

## 2019-12-17 ENCOUNTER — Ambulatory Visit: Payer: BLUE CROSS/BLUE SHIELD | Admitting: Cardiology

## 2020-02-15 ENCOUNTER — Other Ambulatory Visit: Payer: Self-pay | Admitting: Family Medicine

## 2020-03-03 ENCOUNTER — Ambulatory Visit: Payer: BLUE CROSS/BLUE SHIELD | Admitting: Family Medicine

## 2020-03-04 ENCOUNTER — Other Ambulatory Visit (HOSPITAL_COMMUNITY): Payer: Self-pay | Admitting: Family Medicine

## 2020-03-04 ENCOUNTER — Encounter: Payer: Self-pay | Admitting: Family Medicine

## 2020-03-04 ENCOUNTER — Other Ambulatory Visit: Payer: Self-pay

## 2020-03-04 ENCOUNTER — Telehealth (INDEPENDENT_AMBULATORY_CARE_PROVIDER_SITE_OTHER): Payer: BLUE CROSS/BLUE SHIELD | Admitting: Family Medicine

## 2020-03-04 VITALS — BP 131/70

## 2020-03-04 DIAGNOSIS — Z1231 Encounter for screening mammogram for malignant neoplasm of breast: Secondary | ICD-10-CM | POA: Diagnosis not present

## 2020-03-04 DIAGNOSIS — F419 Anxiety disorder, unspecified: Secondary | ICD-10-CM

## 2020-03-04 DIAGNOSIS — I10 Essential (primary) hypertension: Secondary | ICD-10-CM

## 2020-03-04 DIAGNOSIS — F5104 Psychophysiologic insomnia: Secondary | ICD-10-CM

## 2020-03-04 DIAGNOSIS — E663 Overweight: Secondary | ICD-10-CM

## 2020-03-04 DIAGNOSIS — E782 Mixed hyperlipidemia: Secondary | ICD-10-CM | POA: Diagnosis not present

## 2020-03-04 DIAGNOSIS — E559 Vitamin D deficiency, unspecified: Secondary | ICD-10-CM

## 2020-03-04 NOTE — Patient Instructions (Signed)
Annual physical exam in office with MD with Pap and 6 months call if you need me sooner.  No changes in current medications.  Please schedule mammogram for an early morning appointment at as soon as this is possible.  It is currently due.  Please get fasting CBC lipid CMP EGFR TSH and vitamin D in the next 1 to 2 weeks.  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  It is important that you exercise regularly at least 30 minutes 5 times a week. If you develop chest pain, have severe difficulty breathing, or feel very tired, stop exercising immediately and seek medical attention  Think about what you will eat, plan ahead. Choose " clean, green, fresh or frozen" over canned, processed or packaged foods which are more sugary, salty and fatty. 70 to 75% of food eaten should be vegetables and fruit. Three meals at set times with snacks allowed between meals, but they must be fruit or vegetables. Aim to eat over a 12 hour period , example 7 am to 7 pm, and STOP after  your last meal of the day. Drink water,generally about 64 ounces per day, no other drink is as healthy. Fruit juice is best enjoyed in a healthy way, by EATING the fruit. Thanks for choosing [facility], we consider it a privelige to serve you.

## 2020-03-04 NOTE — Progress Notes (Signed)
Virtual Visit via Telephone Note  I connected with Catherine Mclean on 03/04/20 at  2:20 PM EST by telephone and verified that I am speaking with the correct person using two identifiers.  Location: Patient: home Provider: work   I discussed the limitations, risks, security and privacy concerns of performing an evaluation and management service by telephone and the availability of in person appointments. I also discussed with the patient that there may be a patient responsible charge related to this service. The patient expressed understanding and agreed to proceed.   History of Present Illness:   Denies recent fever or chills. Denies sinus pressure, nasal congestion, ear pain or sore throat. Denies chest congestion, productive cough or wheezing. Denies chest pains, palpitations and leg swelling Denies abdominal pain, nausea, vomiting,diarrhea or constipation.   Denies dysuria, frequency, hesitancy or incontinence. Denies joint pain, swelling and limitation in mobility. Denies headaches, seizures, numbness, or tingling. Denies depression, anxiety or insomnia. Denies skin break down or rash.     Observations/Objective: BP 131/70  Good communication with no confusion and intact memory. Alert and oriented x 3 No signs of respiratory distress during speech   Assessment and Plan: Essential hypertension Controlled, no change in medication DASH diet and commitment to daily physical activity for a minimum of 30 minutes discussed and encouraged, as a part of hypertension management. The importance of attaining a healthy weight is also discussed.  BP/Weight 03/04/2020 10/22/2019 07/03/2019 04/25/2019 02/26/2019 10/02/2018 10/16/2351  Systolic BP 614 431 540 086 761 950 932  Diastolic BP 70 67 78 78 78 78 75  Wt. (Lbs) - 145.4 146 146 146 146 148  BMI - 26.59 26.7 26.7 26.7 26.7 27.07       Hyperlipemia Hyperlipidemia:Low fat diet discussed and encouraged.   Lipid Panel  Lab Results   Component Value Date   CHOL 204 (H) 04/20/2019   HDL 53 04/20/2019   LDLCALC 132 (H) 04/20/2019   TRIG 89 04/20/2019   CHOLHDL 3.8 04/20/2019     Updated lab needed at/ before next visit.   Anxiety Controlled, no change in medication   Overweight (BMI 25.0-29.9)  Patient re-educated about  the importance of commitment to a  minimum of 150 minutes of exercise per week as able.  The importance of healthy food choices with portion control discussed, as well as eating regularly and within a 12 hour window most days. The need to choose "clean , green" food 50 to 75% of the time is discussed, as well as to make water the primary drink and set a goal of 64 ounces water daily.    Weight /BMI 10/22/2019 07/03/2019 04/25/2019  WEIGHT 145 lb 6.4 oz 146 lb 146 lb  HEIGHT 5\' 2"  5\' 2"  5\' 2"   BMI 26.59 kg/m2 26.7 kg/m2 26.7 kg/m2      Insomnia Sleep hygiene reviewed and written information offered also. Prescription sent for  medication needed. Controlled, no change in medication     Follow Up Instructions:    I discussed the assessment and treatment plan with the patient. The patient was provided an opportunity to ask questions and all were answered. The patient agreed with the plan and demonstrated an understanding of the instructions.   The patient was advised to call back or seek an in-person evaluation if the symptoms worsen or if the condition fails to improve as anticipated.  I provided 20 minutes of non-face-to-face time during this encounter.   Tula Nakayama, MD

## 2020-03-06 ENCOUNTER — Encounter: Payer: Self-pay | Admitting: Family Medicine

## 2020-03-06 MED ORDER — HYDROCHLOROTHIAZIDE 12.5 MG PO CAPS: 12.5000 mg | ORAL_CAPSULE | Freq: Every day | ORAL | 2 refills | Status: DC

## 2020-03-06 MED ORDER — TEMAZEPAM 30 MG PO CAPS: 30.0000 mg | ORAL_CAPSULE | Freq: Every day | ORAL | 5 refills | Status: DC

## 2020-03-06 MED ORDER — BUSPIRONE HCL 5 MG PO TABS: ORAL_TABLET | ORAL | 1 refills | Status: DC

## 2020-03-06 NOTE — Assessment & Plan Note (Signed)
Controlled, no change in medication  

## 2020-03-06 NOTE — Assessment & Plan Note (Signed)
Controlled, no change in medication DASH diet and commitment to daily physical activity for a minimum of 30 minutes discussed and encouraged, as a part of hypertension management. The importance of attaining a healthy weight is also discussed.  BP/Weight 03/04/2020 10/22/2019 07/03/2019 04/25/2019 02/26/2019 10/02/2018 7/48/2707  Systolic BP 867 544 920 100 712 197 588  Diastolic BP 70 67 78 78 78 78 75  Wt. (Lbs) - 145.4 146 146 146 146 148  BMI - 26.59 26.7 26.7 26.7 26.7 27.07

## 2020-03-06 NOTE — Assessment & Plan Note (Signed)
Sleep hygiene reviewed and written information offered also. Prescription sent for  medication needed. Controlled, no change in medication  

## 2020-03-06 NOTE — Assessment & Plan Note (Signed)
Hyperlipidemia:Low fat diet discussed and encouraged.   Lipid Panel  Lab Results  Component Value Date   CHOL 204 (H) 04/20/2019   HDL 53 04/20/2019   LDLCALC 132 (H) 04/20/2019   TRIG 89 04/20/2019   CHOLHDL 3.8 04/20/2019     Updated lab needed at/ before next visit.

## 2020-03-06 NOTE — Assessment & Plan Note (Signed)
  Patient re-educated about  the importance of commitment to a  minimum of 150 minutes of exercise per week as able.  The importance of healthy food choices with portion control discussed, as well as eating regularly and within a 12 hour window most days. The need to choose "clean , green" food 50 to 75% of the time is discussed, as well as to make water the primary drink and set a goal of 64 ounces water daily.    Weight /BMI 10/22/2019 07/03/2019 04/25/2019  WEIGHT 145 lb 6.4 oz 146 lb 146 lb  HEIGHT 5' 2" 5' 2" 5' 2"  BMI 26.59 kg/m2 26.7 kg/m2 26.7 kg/m2     

## 2020-03-17 ENCOUNTER — Ambulatory Visit (HOSPITAL_COMMUNITY): Payer: BLUE CROSS/BLUE SHIELD

## 2020-04-14 ENCOUNTER — Other Ambulatory Visit: Payer: Self-pay

## 2020-04-14 ENCOUNTER — Ambulatory Visit (HOSPITAL_COMMUNITY)
Admission: RE | Admit: 2020-04-14 | Discharge: 2020-04-14 | Disposition: A | Discharge status: Home / Self Care | Payer: BLUE CROSS/BLUE SHIELD | Source: Ambulatory Visit | Attending: Family Medicine | Admitting: Family Medicine

## 2020-04-14 DIAGNOSIS — Z1231 Encounter for screening mammogram for malignant neoplasm of breast: Secondary | ICD-10-CM | POA: Insufficient documentation

## 2020-04-21 ENCOUNTER — Other Ambulatory Visit: Payer: Self-pay | Admitting: Family Medicine

## 2020-05-14 ENCOUNTER — Other Ambulatory Visit: Payer: Self-pay | Admitting: Family Medicine

## 2020-05-15 ENCOUNTER — Other Ambulatory Visit: Payer: Self-pay | Admitting: Nurse Practitioner

## 2020-05-15 ENCOUNTER — Telehealth: Payer: Self-pay

## 2020-05-15 MED ORDER — TEMAZEPAM 30 MG PO CAPS: ORAL_CAPSULE | ORAL | 2 refills | Status: DC

## 2020-05-15 NOTE — Telephone Encounter (Signed)
It won't send electronically for some reason. Says there are invalid pharmacy details. I printed it.

## 2020-05-15 NOTE — Telephone Encounter (Signed)
faxed

## 2020-05-15 NOTE — Telephone Encounter (Signed)
Requesting refill of temazepam (the rx was printed yesterday under dr simpsons name) can you send it electronically?

## 2020-08-26 ENCOUNTER — Telehealth: Payer: Self-pay

## 2020-08-26 ENCOUNTER — Other Ambulatory Visit: Payer: Self-pay

## 2020-08-26 DIAGNOSIS — F5104 Psychophysiologic insomnia: Secondary | ICD-10-CM

## 2020-08-26 MED ORDER — TEMAZEPAM 30 MG PO CAPS: ORAL_CAPSULE | ORAL | 2 refills | Status: DC

## 2020-08-26 NOTE — Telephone Encounter (Signed)
Pt aware.

## 2020-08-26 NOTE — Telephone Encounter (Signed)
Rx printed to be signed and faxed to pharmacy.

## 2020-08-26 NOTE — Telephone Encounter (Signed)
Pt needs auth for Temazepam called in --she has been without all weekend

## 2020-08-27 ENCOUNTER — Other Ambulatory Visit: Payer: Self-pay

## 2020-08-27 DIAGNOSIS — F5104 Psychophysiologic insomnia: Secondary | ICD-10-CM

## 2020-08-27 MED ORDER — TEMAZEPAM 30 MG PO CAPS: ORAL_CAPSULE | ORAL | 2 refills | Status: DC

## 2020-08-27 NOTE — Telephone Encounter (Signed)
Rx has not been denied, rx resent.

## 2020-08-27 NOTE — Telephone Encounter (Signed)
Patient called said [facility] on [address] denied medication.

## 2020-09-02 ENCOUNTER — Encounter: Payer: BLUE CROSS/BLUE SHIELD | Admitting: Family Medicine

## 2020-09-18 ENCOUNTER — Encounter: Payer: BLUE CROSS/BLUE SHIELD | Admitting: Family Medicine

## 2020-10-01 ENCOUNTER — Other Ambulatory Visit: Payer: Self-pay | Admitting: Family Medicine

## 2020-10-17 ENCOUNTER — Encounter: Payer: Self-pay | Admitting: Family Medicine

## 2020-10-17 ENCOUNTER — Ambulatory Visit (INDEPENDENT_AMBULATORY_CARE_PROVIDER_SITE_OTHER): Payer: BLUE CROSS/BLUE SHIELD | Admitting: Family Medicine

## 2020-10-17 ENCOUNTER — Other Ambulatory Visit: Payer: Self-pay

## 2020-10-17 VITALS — BP 134/72 | HR 86 | Resp 16 | Ht 62.0 in | Wt 138.1 lb

## 2020-10-17 DIAGNOSIS — I10 Essential (primary) hypertension: Secondary | ICD-10-CM | POA: Diagnosis not present

## 2020-10-17 DIAGNOSIS — F411 Generalized anxiety disorder: Secondary | ICD-10-CM | POA: Diagnosis not present

## 2020-10-17 DIAGNOSIS — E782 Mixed hyperlipidemia: Secondary | ICD-10-CM

## 2020-10-17 DIAGNOSIS — Z23 Encounter for immunization: Secondary | ICD-10-CM | POA: Diagnosis not present

## 2020-10-17 DIAGNOSIS — E663 Overweight: Secondary | ICD-10-CM

## 2020-10-17 DIAGNOSIS — F322 Major depressive disorder, single episode, severe without psychotic features: Secondary | ICD-10-CM

## 2020-10-17 DIAGNOSIS — E559 Vitamin D deficiency, unspecified: Secondary | ICD-10-CM

## 2020-10-17 DIAGNOSIS — F5104 Psychophysiologic insomnia: Secondary | ICD-10-CM

## 2020-10-17 MED ORDER — FLUOXETINE HCL 20 MG PO TABS: 20.0000 mg | ORAL_TABLET | Freq: Every day | ORAL | 3 refills | Status: DC

## 2020-10-17 MED ORDER — TEMAZEPAM 30 MG PO CAPS: 30.0000 mg | ORAL_CAPSULE | Freq: Every evening | ORAL | 3 refills | Status: DC | PRN

## 2020-10-17 MED ORDER — BUSPIRONE HCL 10 MG PO TABS
10.0000 mg | ORAL_TABLET | Freq: Two times a day (BID) | ORAL | 3 refills | Status: DC
Start: 2020-10-17 — End: 2021-04-17

## 2020-10-17 NOTE — Patient Instructions (Addendum)
CPE and pap in 6 weeks, cal if you need me sooner  Flu vaccine today  Labs today CBC, lipid, cmp and EGFR, TSH, and Vit D  You are referred to therapist  New for depression and anxiery, buspar 10 mg  at night, fluoxetine 20 mg every morning and continue restoril 30 mg at night  Thanks for choosing [facility], we consider it a privelige to serve you.

## 2020-10-17 NOTE — Assessment & Plan Note (Signed)
Buspar 10 mg at bedtime

## 2020-10-18 ENCOUNTER — Encounter: Payer: Self-pay | Admitting: Family Medicine

## 2020-10-18 LAB — CMP14+EGFR
ALT: 12 IU/L (ref 0–32)
AST: 34 IU/L (ref 0–40)
Albumin/Globulin Ratio: 1.7 (ref 1.2–2.2)
Albumin: 4.8 g/dL (ref 3.8–4.8)
Alkaline Phosphatase: 119 IU/L (ref 44–121)
BUN/Creatinine Ratio: 7 — ABNORMAL LOW (ref 12–28)
BUN: 7 mg/dL — ABNORMAL LOW (ref 8–27)
Bilirubin Total: 0.3 mg/dL (ref 0.0–1.2)
CO2: 23 mmol/L (ref 20–29)
Calcium: 10.3 mg/dL (ref 8.7–10.3)
Chloride: 101 mmol/L (ref 96–106)
Creatinine, Ser: 0.94 mg/dL (ref 0.57–1.00)
Globulin, Total: 2.8 g/dL (ref 1.5–4.5)
Glucose: 88 mg/dL (ref 65–99)
Potassium: 3.9 mmol/L (ref 3.5–5.2)
Sodium: 139 mmol/L (ref 134–144)
Total Protein: 7.6 g/dL (ref 6.0–8.5)
eGFR: 69 mL/min/{1.73_m2} (ref >59)

## 2020-10-18 LAB — CBC
Hematocrit: 43.6 % (ref 34.0–46.6)
Hemoglobin: 14.2 g/dL (ref 11.1–15.9)
MCH: 27.8 pg (ref 26.6–33.0)
MCHC: 32.6 g/dL (ref 31.5–35.7)
MCV: 86 fL (ref 79–97)
Platelets: 232 10*3/uL (ref 150–450)
RBC: 5.1 x10E6/uL (ref 3.77–5.28)
RDW: 13.6 % (ref 11.7–15.4)
WBC: 9.1 10*3/uL (ref 3.4–10.8)

## 2020-10-18 LAB — LIPID PANEL
Chol/HDL Ratio: 4.1 ratio (ref 0.0–4.4)
Cholesterol, Total: 232 mg/dL — ABNORMAL HIGH (ref 100–199)
HDL: 56 mg/dL (ref >39)
LDL Chol Calc (NIH): 156 mg/dL — ABNORMAL HIGH (ref 0–99)
Triglycerides: 110 mg/dL (ref 0–149)
VLDL Cholesterol Cal: 20 mg/dL (ref 5–40)

## 2020-10-18 LAB — VITAMIN D 25 HYDROXY (VIT D DEFICIENCY, FRACTURES): Vit D, 25-Hydroxy: 19.4 ng/mL — ABNORMAL LOW (ref 30.0–100.0)

## 2020-10-18 LAB — TSH: TSH: 0.842 u[IU]/mL (ref 0.450–4.500)

## 2020-10-18 NOTE — Assessment & Plan Note (Addendum)
Improved Patient re-educated about  the importance of commitment to a  minimum of 150 minutes of exercise per week as able.  The importance of healthy food choices with portion control discussed, as well as eating regularly and within a 12 hour window most days. The need to choose "clean , green" food 50 to 75% of the time is discussed, as well as to make water the primary drink and set a goal of 64 ounces water daily.    Weight /BMI 10/17/2020 10/22/2019 07/03/2019  WEIGHT 138 lb 1.9 oz 145 lb 6.4 oz 146 lb  HEIGHT '5\' 2"'$  '5\' 2"'$  '5\' 2"'$   BMI 25.26 kg/m2 26.59 kg/m2 26.7 kg/m2

## 2020-10-18 NOTE — Assessment & Plan Note (Signed)
Hyperlipidemia:Low fat diet discussed and encouraged.   Lipid Panel  Lab Results  Component Value Date   CHOL 232 (H) 10/17/2020   HDL 56 10/17/2020   LDLCALC 156 (H) 10/17/2020   TRIG 110 10/17/2020   CHOLHDL 4.1 10/17/2020     Needs to reduce fried and fatty foods

## 2020-10-18 NOTE — Assessment & Plan Note (Signed)
Start medication, not suicidal or homicidal , refer to Therapy

## 2020-10-18 NOTE — Assessment & Plan Note (Signed)
Uncontrolled Sleep hygiene reviewed and written information offered also. Prescription sent for  medication needed.  

## 2020-10-18 NOTE — Assessment & Plan Note (Signed)
Controlled, no change in medication DASH diet and commitment to daily physical activity for a minimum of 30 minutes discussed and encouraged, as a part of hypertension management. The importance of attaining a healthy weight is also discussed.  BP/Weight 10/17/2020 03/04/2020 10/22/2019 07/03/2019 04/25/2019 02/26/2019 [id number]  Systolic BP [id number] A999333 A999333 A999333 [id number]  Diastolic BP 72 70 67 78 78 78 78  Wt. (Lbs) 138.12 - 145.4 146 146 146 146  BMI 25.26 - 26.59 26.7 26.7 26.7 26.7

## 2020-10-18 NOTE — Progress Notes (Signed)
Catherine Mclean     MRN: [id number]      DOB: 24-Jan-1961   HPI Catherine Mclean is here for follow up and re-evaluation of chronic medical conditions, medication management and review of any available recent lab and radiology data.  Preventive health is updated, specifically  Cancer screening and Immunization.   Questions or concerns regarding consultations or procedures which the PT has had in the interim are  addressed. The PT denies any adverse reactions to current medications since the last visit.  C/o very poor sleep wakes after 4 to 5 hours, and increased stress in the past 6 months, wants help  ROS Denies recent fever or chills. Denies sinus pressure, nasal congestion, ear pain or sore throat. Denies chest congestion, productive cough or wheezing. Denies chest pains, palpitations and leg swelling Denies abdominal pain, nausea, vomiting,diarrhea or constipation.   Denies dysuria, frequency, hesitancy or incontinence. Denies joint pain, swelling and limitation in mobility. Denies headaches, seizures, numbness, or tingling.  Denies skin break down or rash.   PE  BP 134/72   Pulse 86   Resp 16   Ht '5\' 2"'$  (1.575 m)   Wt 138 lb 1.9 oz (62.7 kg)   SpO2 97%   BMI 25.26 kg/m   Patient alert and oriented and in no cardiopulmonary distress.  HEENT: No facial asymmetry, EOMI,     Neck supple .  Chest: Clear to auscultation bilaterally.  CVS: S1, S2 no murmurs, no S3.Regular rate.  ABD: Soft non tender.   Ext: No edema  MS: Adequate ROM spine, shoulders, hips and knees.  Skin: Intact, no ulcerations or rash noted.  Psych: Good eye contact, normal affect. Memory intact  anxious and depressed appearing.  CNS: CN 2-12 intact, power,  normal throughout.no focal deficits noted.   Assessment & Plan  GAD (generalized anxiety disorder) Buspar 10 mg at bedtime  Depression, major, single episode, severe (Catherine Mclean) Start medication, not suicidal or homicidal , refer to  Therapy  Insomnia Uncontrolled Sleep hygiene reviewed and written information offered also. Prescription sent for  medication needed.   Essential hypertension Controlled, no change in medication DASH diet and commitment to daily physical activity for a minimum of 30 minutes discussed and encouraged, as a part of hypertension management. The importance of attaining a healthy weight is also discussed.  BP/Weight 10/17/2020 03/04/2020 10/22/2019 07/03/2019 04/25/2019 02/26/2019 [id number]  Systolic BP [id number] A999333 A999333 [id number]  Diastolic BP 72 70 67 78 78 78 78  Wt. (Lbs) 138.12 - 145.4 146 146 146 146  BMI 25.26 - 26.59 26.7 26.7 26.7 26.7       Overweight (BMI 25.0-29.9) Improved Patient re-educated about  the importance of commitment to a  minimum of 150 minutes of exercise per week as able.  The importance of healthy food choices with portion control discussed, as well as eating regularly and within a 12 hour window most days. The need to choose "clean , green" food 50 to 75% of the time is discussed, as well as to make water the primary drink and set a goal of 64 ounces water daily.    Weight /BMI 10/17/2020 10/22/2019 07/03/2019  WEIGHT 138 lb 1.9 oz 145 lb 6.4 oz 146 lb  HEIGHT '5\' 2"'$  '5\' 2"'$  '5\' 2"'$   BMI 25.26 kg/m2 26.59 kg/m2 26.7 kg/m2      Hyperlipemia Hyperlipidemia:Low fat diet discussed and encouraged.   Lipid Panel  Lab Results  Component Value Date   CHOL 232 (  H) 10/17/2020   HDL 56 10/17/2020   LDLCALC 156 (H) 10/17/2020   TRIG 110 10/17/2020   CHOLHDL 4.1 10/17/2020     Needs to reduce fried and fatty foods

## 2020-11-04 ENCOUNTER — Telehealth: Payer: BLUE CROSS/BLUE SHIELD

## 2020-12-02 ENCOUNTER — Telehealth: Payer: Self-pay | Admitting: Family Medicine

## 2020-12-02 ENCOUNTER — Encounter: Payer: BLUE CROSS/BLUE SHIELD | Admitting: Family Medicine

## 2020-12-02 NOTE — Telephone Encounter (Signed)
Her lab results were sent to [facility]- that's why nobody called. Had appt for CPE today (rescheduled to Jan due to her not feeling well/fever) Since having concerns, will need to do a phone visit for concerns when next available.

## 2020-12-02 NOTE — Telephone Encounter (Signed)
Patient called in with concerns about past blood work, she never received a call back about the results. Is experiencing coldness. Pt also states that she has been taking iron over the counter since blood work and feels it isn't working, wants to know what she should do or what can we do.

## 2020-12-02 NOTE — Telephone Encounter (Signed)
Result note was sent to her active mychart account-although the patient last logged in yesterday, she did not read the result note that was sent.

## 2020-12-10 ENCOUNTER — Telehealth: Payer: BLUE CROSS/BLUE SHIELD | Admitting: Family Medicine

## 2021-01-15 ENCOUNTER — Encounter: Payer: Self-pay | Admitting: Family Medicine

## 2021-01-16 ENCOUNTER — Other Ambulatory Visit: Payer: Self-pay | Admitting: Family Medicine

## 2021-01-16 ENCOUNTER — Telehealth: Payer: Self-pay

## 2021-01-16 ENCOUNTER — Telehealth: Payer: BLUE CROSS/BLUE SHIELD | Admitting: Nurse Practitioner

## 2021-01-16 ENCOUNTER — Telehealth: Payer: BLUE CROSS/BLUE SHIELD | Admitting: Emergency Medicine

## 2021-01-16 DIAGNOSIS — U071 COVID-19: Secondary | ICD-10-CM

## 2021-01-16 MED ORDER — NIRMATRELVIR/RITONAVIR (PAXLOVID)TABLET: 3.0000 | ORAL_TABLET | Freq: Two times a day (BID) | ORAL | 0 refills | Status: AC

## 2021-01-16 MED ORDER — BENZONATATE 100 MG PO CAPS
100.0000 mg | ORAL_CAPSULE | Freq: Two times a day (BID) | ORAL | 0 refills | Status: DC | PRN
Start: 2021-01-16 — End: 2021-04-17

## 2021-01-16 MED ORDER — FLUTICASONE PROPIONATE 50 MCG/ACT NA SUSP: 2.0000 | Freq: Every day | NASAL | 6 refills | Status: DC

## 2021-01-16 NOTE — Telephone Encounter (Signed)
Catherine Mclean spoke with her about her evisit today

## 2021-01-16 NOTE — Progress Notes (Signed)
Duplicate encounter.  Error

## 2021-01-16 NOTE — Progress Notes (Signed)
Id

## 2021-01-16 NOTE — Progress Notes (Signed)
E-Visit  for Positive Covid Test Result  We are sorry you are not feeling well. We are here to help!  You have tested positive for COVID-19, meaning that you were infected with the novel coronavirus and could give the virus to others.  It is vitally important that you stay home so you do not spread it to others.  Let me  know if you need a work note.   Please continue isolation at home, for at least 10 days since the start of your symptoms and until you have had 24 hours with no fever (without taking a fever reducer) and with improving of symptoms.  If you have no symptoms but tested positive (or all symptoms resolve after 5 days and you have no fever) you can leave your house but continue to wear a mask around others for an additional 5 days. If you have a fever,continue to stay home until you have had 24 hours of no fever. Most cases improve 5-10 days from onset but we have seen a small number of patients who have gotten worse after the 10 days.  Please be sure to watch for worsening symptoms and remain taking the proper precautions.   Go to the nearest hospital ED for assessment if fever/cough/breathlessness are severe or illness seems like a threat to life.    The following symptoms may appear 2-14 days after exposure: Fever Cough Shortness of breath or difficulty breathing Chills Repeated shaking with chills Muscle pain Headache Sore throat New loss of taste or smell Fatigue Congestion or runny nose Nausea or vomiting Diarrhea  You have been enrolled in [facility] for COVID-19. Daily you will receive a questionnaire within the [facility] website. Our COVID-19 response team will be monitoring your responses daily.  You can use medication such as prescription cough medication called Tessalon Perles 100 mg. You may take 1-2 capsules every 8 hours as needed for cough and prescription for Fluticasone nasal spray 2 sprays in each nostril one time per day  You may also  take acetaminophen (Tylenol) as needed for fever.  HOME CARE: Only take medications as instructed by your medical team. Drink plenty of fluids and get plenty of rest. A steam or ultrasonic humidifier can help if you have congestion.   GET HELP RIGHT AWAY IF YOU HAVE EMERGENCY WARNING SIGNS.  Call 911 or proceed to your closest emergency facility if: You develop worsening high fever. Trouble breathing Bluish lips or face Persistent pain or pressure in the chest New confusion Inability to wake or stay awake You cough up blood. Your symptoms become more severe Inability to hold down food or fluids  This list is not all possible symptoms. Contact your medical provider for any symptoms that are severe or concerning to you.    Your e-visit answers were reviewed by a board certified advanced clinical practitioner to complete your personal care plan.  Depending on the condition, your plan could have included both over the counter or prescription medications.  If there is a problem please reply once you have received a response from your provider.  Your safety is important to [place].  If you have drug allergies check your prescription carefully.    You can use MyChart to ask questions about today's visit, request a non-urgent call back, or ask for a work or school excuse for 24 hours related to this e-Visit. If it has been greater than 24 hours you will need to follow up with your provider,  or enter a new e-Visit to address those concerns. You will get an e-mail in the next two days asking about your experience.  I hope that your e-visit has been valuable and will speed your recovery. Thank you for using e-visits.  I have spent 5 minutes in review of e-visit questionnaire, review and updating patient chart, medical decision making and response to patient.   Willeen Cass, PhD, FNP-BC

## 2021-01-16 NOTE — Telephone Encounter (Signed)
Tested positive for covid yesterday. Did an evisit online and was called in tessalon perles and flonase. Is there anything else she needs to take like antivirals? Bad cough and feels very bad.

## 2021-01-16 NOTE — Telephone Encounter (Signed)
Patient aware.

## 2021-03-04 ENCOUNTER — Encounter: Payer: BLUE CROSS/BLUE SHIELD | Admitting: Family Medicine

## 2021-03-04 IMAGING — MG MM DIGITAL SCREENING BILAT W/ TOMO AND CAD
8 series · 8 of 24 positions shown · non-contrast
Comparison: Previous exam(s).

CLINICAL DATA: Screening.

EXAM:
DIGITAL SCREENING BILATERAL MAMMOGRAM WITH TOMOSYNTHESIS AND CAD
TECHNIQUE: Bilateral screening digital craniocaudal and mediolateral oblique
mammograms were obtained. Bilateral screening digital breast
tomosynthesis was performed. The images were evaluated with
computer-aided detection.

[L MLO synth-2D]
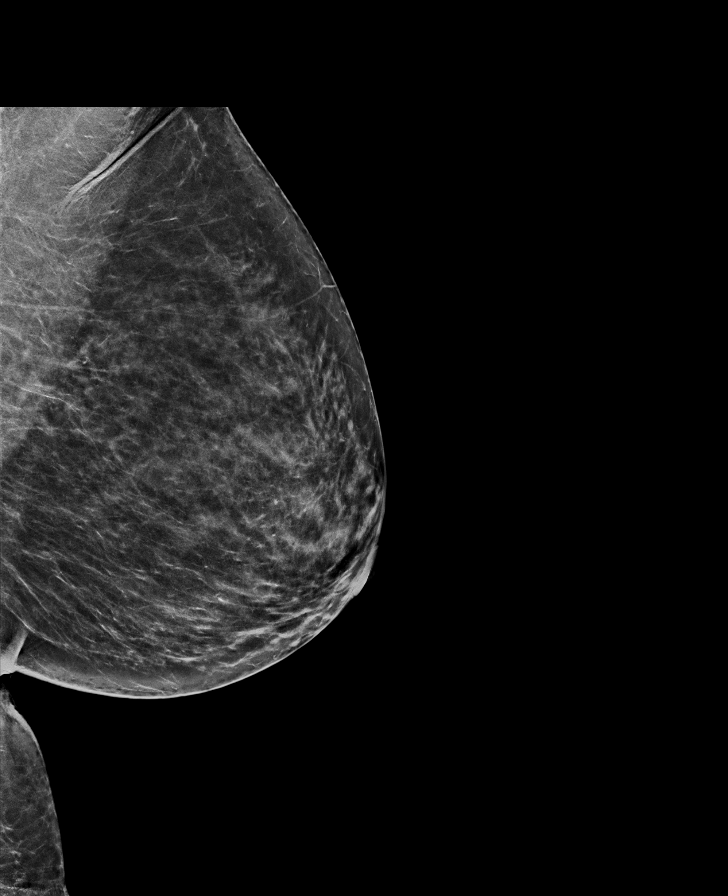

[R MLO synth-2D]
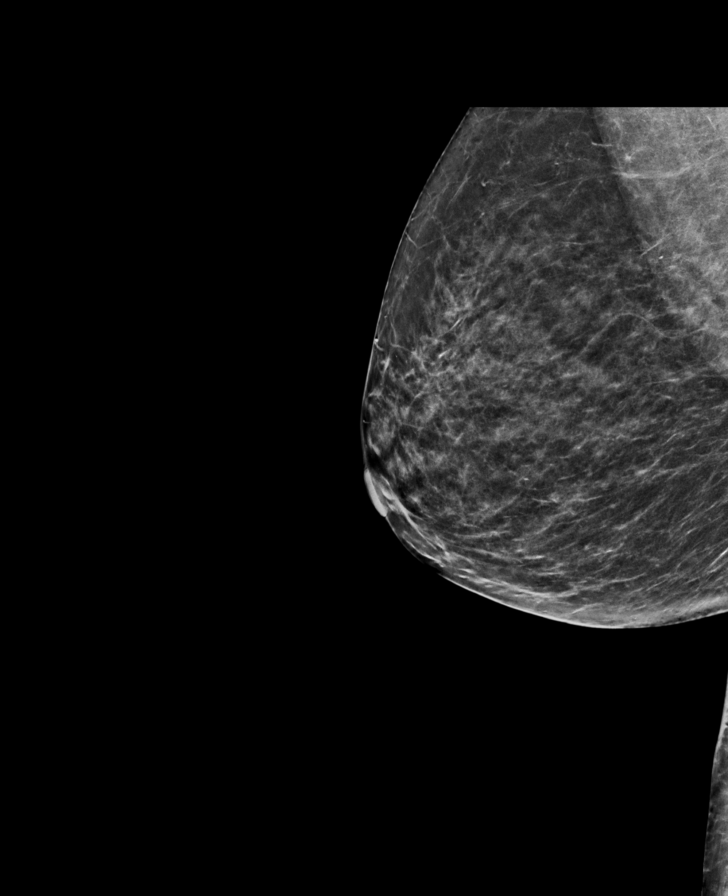

[L CC synth-2D]
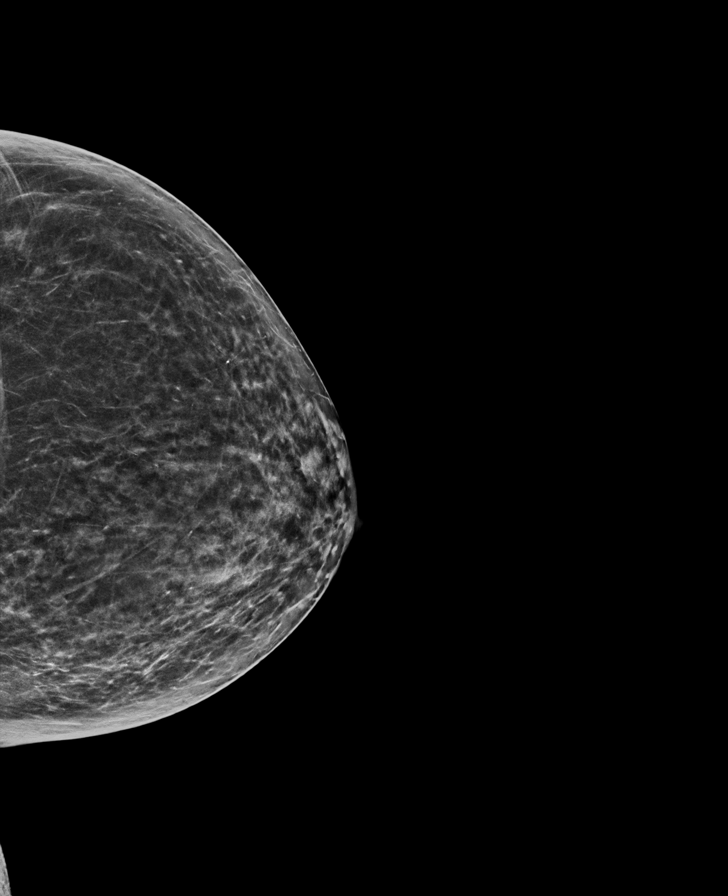

[R CC synth-2D]
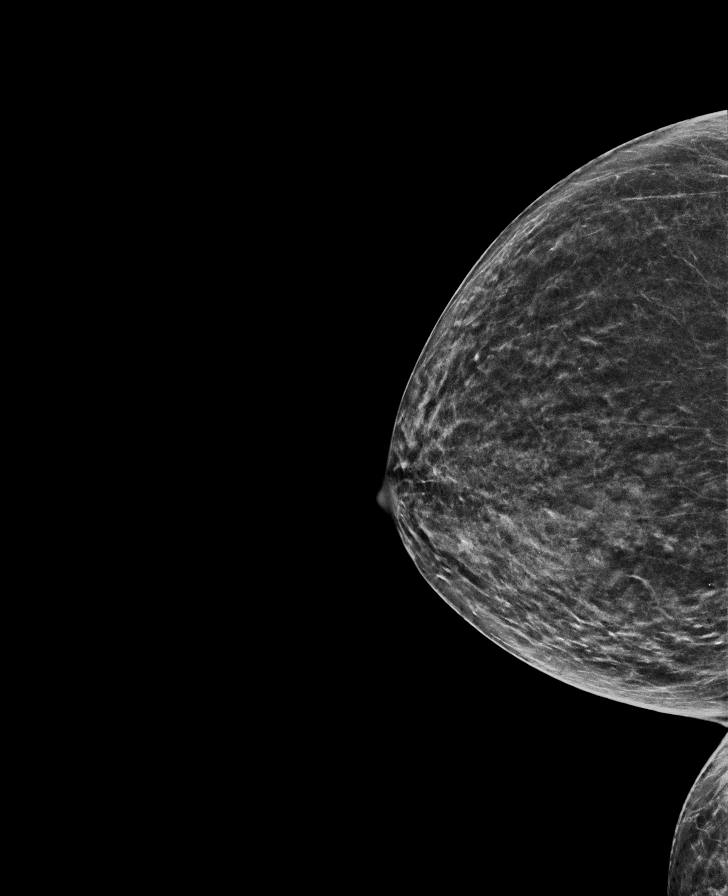

[L CC tomo · tomo slice 33/66.0]
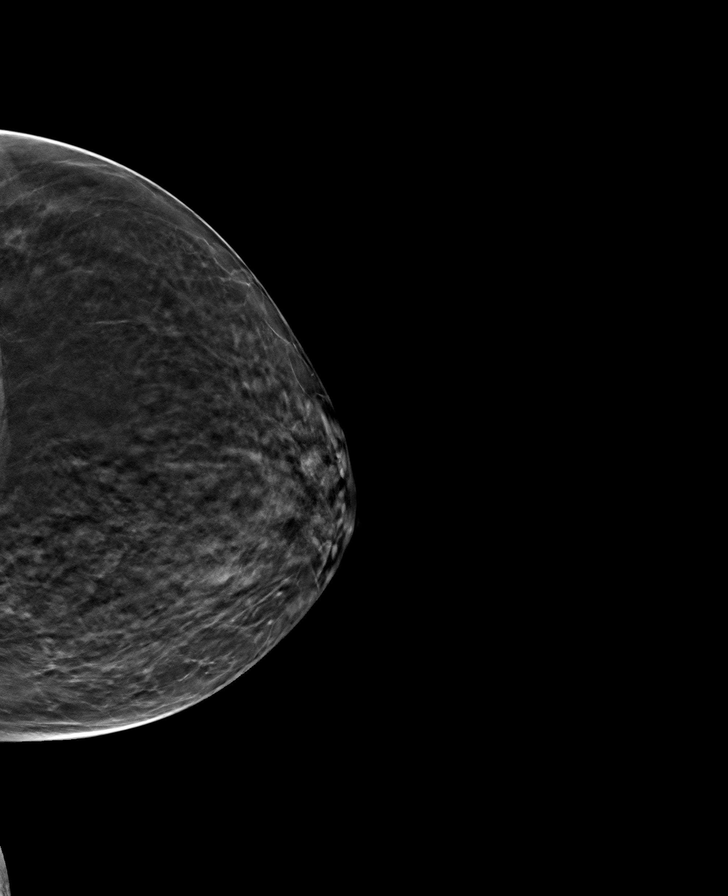

[L MLO tomo · tomo slice 35/69.0]
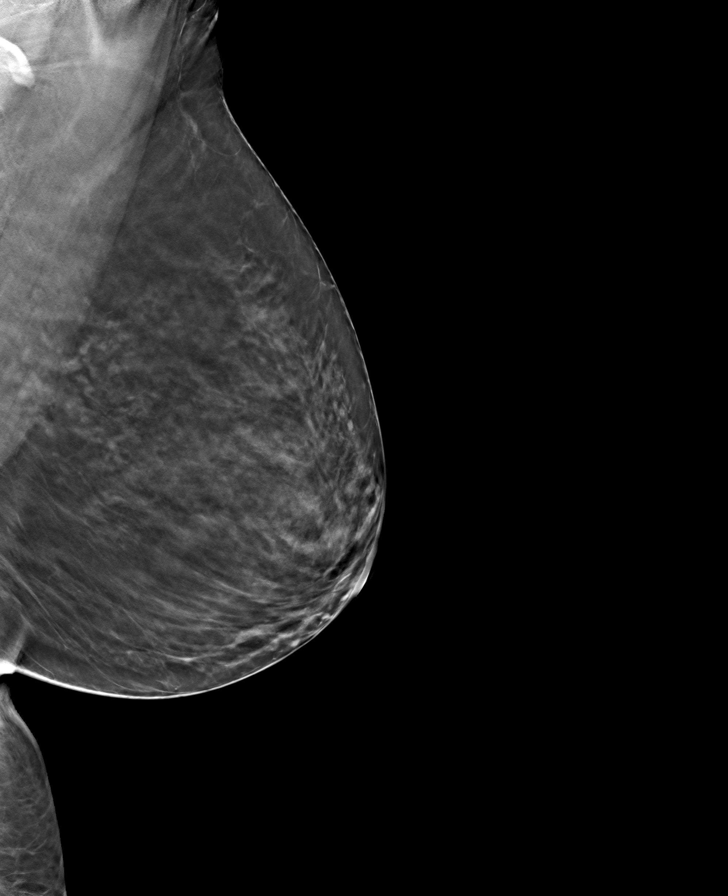

[R MLO tomo · tomo slice 31/62.0]
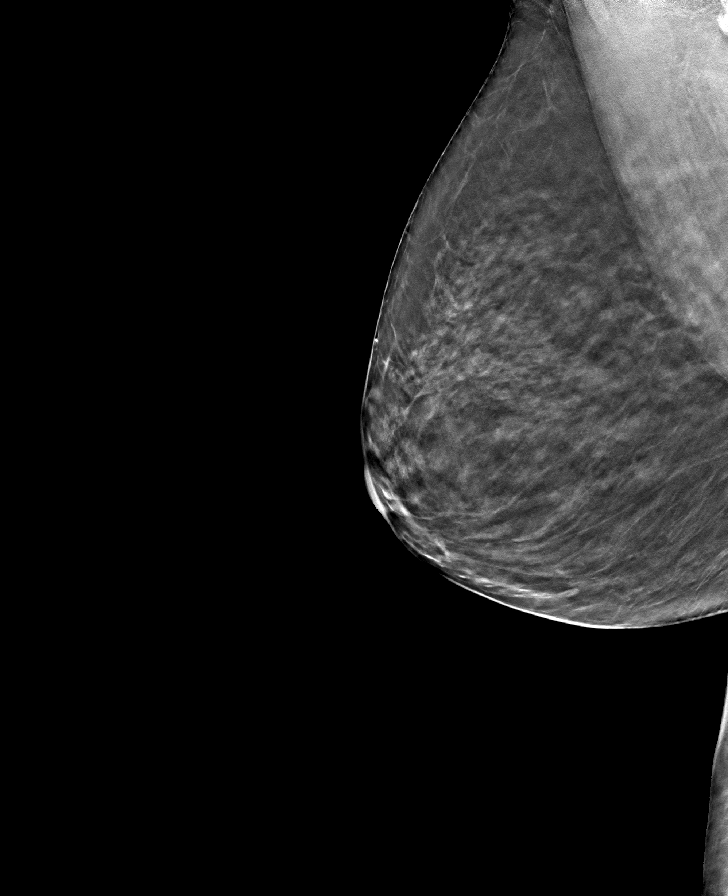

[R CC tomo · tomo slice 30/59.0]
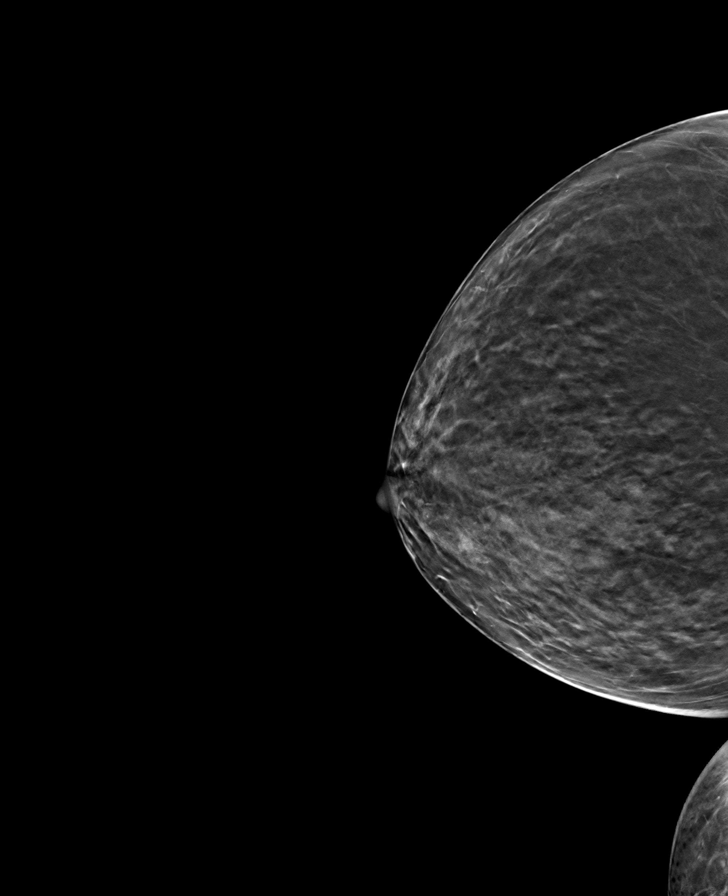

[8 of 24 positions shown; findings below may reference images not displayed]

ACR Breast Density Category b: There are scattered areas of
fibroglandular density.
FINDINGS: There are no findings suspicious for malignancy.
IMPRESSION: No mammographic evidence of malignancy. A result letter of this
screening mammogram will be mailed directly to the patient.

RECOMMENDATION:
Screening mammogram in one year. (Code:51-O-LD2)

BI-RADS CATEGORY  1: Negative.

## 2021-03-05 ENCOUNTER — Other Ambulatory Visit: Payer: Self-pay | Admitting: Family Medicine

## 2021-04-17 ENCOUNTER — Encounter: Payer: Self-pay | Admitting: Family Medicine

## 2021-04-17 ENCOUNTER — Other Ambulatory Visit (HOSPITAL_COMMUNITY)
Admission: RE | Admit: 2021-04-17 | Discharge: 2021-04-17 | Disposition: A | Discharge status: Home / Self Care | Payer: 59 | Source: Ambulatory Visit | Attending: Family Medicine | Admitting: Family Medicine

## 2021-04-17 ENCOUNTER — Other Ambulatory Visit: Payer: Self-pay

## 2021-04-17 ENCOUNTER — Ambulatory Visit (INDEPENDENT_AMBULATORY_CARE_PROVIDER_SITE_OTHER): Payer: 59 | Admitting: Family Medicine

## 2021-04-17 VITALS — BP 142/78 | HR 67 | Ht 63.0 in | Wt 134.0 lb

## 2021-04-17 DIAGNOSIS — I10 Essential (primary) hypertension: Secondary | ICD-10-CM | POA: Diagnosis not present

## 2021-04-17 DIAGNOSIS — Z0001 Encounter for general adult medical examination with abnormal findings: Secondary | ICD-10-CM

## 2021-04-17 DIAGNOSIS — F5104 Psychophysiologic insomnia: Secondary | ICD-10-CM | POA: Diagnosis not present

## 2021-04-17 DIAGNOSIS — Z0000 Encounter for general adult medical examination without abnormal findings: Secondary | ICD-10-CM | POA: Diagnosis present

## 2021-04-17 DIAGNOSIS — N950 Postmenopausal bleeding: Secondary | ICD-10-CM | POA: Diagnosis not present

## 2021-04-17 DIAGNOSIS — E785 Hyperlipidemia, unspecified: Secondary | ICD-10-CM

## 2021-04-17 DIAGNOSIS — Z1231 Encounter for screening mammogram for malignant neoplasm of breast: Secondary | ICD-10-CM

## 2021-04-17 DIAGNOSIS — R058 Other specified cough: Secondary | ICD-10-CM | POA: Diagnosis not present

## 2021-04-17 MED ORDER — ZOLPIDEM TARTRATE 5 MG PO TABS: 5.0000 mg | ORAL_TABLET | Freq: Every day | ORAL | 1 refills | Status: DC

## 2021-04-17 MED ORDER — BENZONATATE 100 MG PO CAPS: 100.0000 mg | ORAL_CAPSULE | Freq: Two times a day (BID) | ORAL | 0 refills | Status: DC | PRN

## 2021-04-17 MED ORDER — FLUOXETINE HCL 20 MG PO CAPS: 20.0000 mg | ORAL_CAPSULE | Freq: Every day | ORAL | 3 refills | Status: DC

## 2021-04-17 MED ORDER — ERGOCALCIFEROL 1.25 MG (50000 UT) PO CAPS: 50000.0000 [IU] | ORAL_CAPSULE | ORAL | 2 refills | Status: DC

## 2021-04-17 MED ORDER — TIZANIDINE HCL 4 MG PO TABS: ORAL_TABLET | ORAL | 5 refills | Status: DC

## 2021-04-17 MED ORDER — MONTELUKAST SODIUM 10 MG PO TABS: 10.0000 mg | ORAL_TABLET | Freq: Every day | ORAL | 3 refills | Status: DC

## 2021-04-17 MED ORDER — SPIRONOLACTONE 25 MG PO TABS: 25.0000 mg | ORAL_TABLET | Freq: Every day | ORAL | 3 refills | Status: DC

## 2021-04-17 NOTE — Patient Instructions (Addendum)
F/u RE EVALUATE SLEEP AND Bp IN 6 TO 8 WEEKS  YOU WILL BE REFERRED TO Dr.  Elonda Husky, RE SPOTTING  Pap sent  Please schedule mammogram at checkout  New for BP is spironolactone one daily, STOP hCTZ  New for sleep is zolpidem  For chest congestion and allergies, moontelukast and tessalon perles are prescribed   Weekly vit D is presc ribed  Please get fasting lipid, cmp and eGFR 3 to 5 days before next visiti  Thanks for choosing [facility], we consider it a privelige to serve you.

## 2021-04-17 NOTE — Assessment & Plan Note (Signed)

## 2021-04-17 NOTE — Progress Notes (Signed)
Catherine Mclean     MRN: [id number]      DOB: 02-Feb-1961  HPI: Patient is in for annual physical exam. Nasal drainage and chest congestion x 4 days. Poor sleep awaken andhave difficulty staying asleep, on average 4 hrs/ night C/o recent vaginal spotting, scant amount , 1 episoe Recent labs,  are reviewed. Immunization is reviewed , and  updated if needed.   PE: BP (!) 142/78 (BP Location: Right Arm, Cuff Size: Normal)    Pulse 67    Ht 5\' 3"  (1.6 m)    Wt 134 lb (60.8 kg)    SpO2 100%    BMI 23.74 kg/m   Pleasant  female, alert and oriented x 3, in no cardio-pulmonary distress. Afebrile. HEENT No facial trauma or asymetry. Sinuses non tender. Nasal congestion present Extra occullar muscles intact.. External ears normal, . Neck: supple, no adenopathy,JVD or thyromegaly.No bruits.  Chest: Clear to ascultation bilaterally.No crackles or wheezes. Non tender to palpation  Breast: No asymetry,no masses or lumps. No tenderness. No nipple discharge or inversion. No axillary or supraclavicular adenopathy  Cardiovascular system; Heart sounds normal,  S1 and  S2 ,no S3.  No murmur, or thrill. Apical beat not displaced Peripheral pulses normal.  Abdomen: Soft, non tender, no organomegaly or masses. No bruits. Bowel sounds normal. No guarding, tenderness or rebound.   GU: External genitalia normal female genitalia , normal female distribution of hair. No lesions. Urethral meatus normal in size, no  Prolapse, no lesions visibly  Present. Bladder non tender. Vagina pink and moist , with no visible lesions , discharge present . Adequate pelvic support no  cystocele or rectocele noted Cervix pink and appears healthy, no lesions or ulcerations noted, no discharge noted from os Uterus normal size, no adnexal masses, no cervical motion or adnexal tenderness.   Musculoskeletal exam: Full ROM of spine, hips , shoulders and knees. No deformity ,swelling or crepitus noted. No  muscle wasting or atrophy.   Neurologic: Cranial nerves 2 to 12 intact. Power, tone ,sensation and reflexes normal throughout. No disturbance in gait. No tremor.  Skin: Intact, no ulceration, erythema , scaling or rash noted. Pigmentation normal throughout  Psych; Normal mood and affect. Judgement and concentration normal   Assessment & Plan:  Annual physical exam Annual exam as documented. Counseling done  re healthy lifestyle involving commitment to 150 minutes exercise per week, heart healthy diet, and attaining healthy weight.The importance of adequate sleep also discussed. Regular seat belt use and home safety, is also discussed. Changes in health habits are decided on by the patient with goals and time frames  set for achieving them. Immunization and cancer screening needs are specifically addressed at this visit.    Essential hypertension Uncontrolled, stop HCTZ , new is spironolactone 25 mg daily DASH diet and commitment to daily physical activity for a minimum of 30 minutes discussed and encouraged, as a part of hypertension management. The importance of attaining a healthy weight is also discussed.  BP/Weight 04/17/2021 10/17/2020 03/04/2020 10/22/2019 07/03/2019 03/31/2534 07/26/4032  Systolic BP 742 595 638 756 433 295 188  Diastolic BP 78 72 70 67 78 78 78  Wt. (Lbs) 134 138.12 - 145.4 146 146 146  BMI 23.74 25.26 - 26.59 26.7 26.7 26.7       Insomnia Sleep hygiene reviewed and written information offered also. Prescription sent for  medication needed. Remeron ineffective, start zolpidem   PMB (postmenopausal bleeding) Reports recent episode of vaginal spotting, scant, refer  to [facility] for eval  Allergic cough Needs to commit to daily singulair, and tessalon perle prescribed for as needed  use

## 2021-04-19 ENCOUNTER — Encounter: Payer: Self-pay | Admitting: Family Medicine

## 2021-04-19 DIAGNOSIS — R058 Other specified cough: Secondary | ICD-10-CM | POA: Insufficient documentation

## 2021-04-19 DIAGNOSIS — F324 Major depressive disorder, single episode, in partial remission: Secondary | ICD-10-CM | POA: Insufficient documentation

## 2021-04-19 NOTE — Assessment & Plan Note (Signed)
Reports recent episode of vaginal spotting, scant, refer to [facility] for eval

## 2021-04-19 NOTE — Assessment & Plan Note (Signed)
Uncontrolled, stop HCTZ , new is spironolactone 25 mg daily DASH diet and commitment to daily physical activity for a minimum of 30 minutes discussed and encouraged, as a part of hypertension management. The importance of attaining a healthy weight is also discussed.  BP/Weight 04/17/2021 10/17/2020 03/04/2020 10/22/2019 07/03/2019 09/22/8401 08/27/4358  Systolic BP 677 034 035 248 185 909 311  Diastolic BP 78 72 70 67 78 78 78  Wt. (Lbs) 134 138.12 - 145.4 146 146 146  BMI 23.74 25.26 - 26.59 26.7 26.7 26.7

## 2021-04-19 NOTE — Assessment & Plan Note (Signed)
Needs to commit to daily singulair, and tessalon perle prescribed for as needed  use

## 2021-04-19 NOTE — Assessment & Plan Note (Signed)
Sleep hygiene reviewed and written information offered also. Prescription sent for  medication needed. Remeron ineffective, start zolpidem

## 2021-04-22 LAB — CYTOLOGY - PAP
Comment: NEGATIVE
Diagnosis: NEGATIVE
High risk HPV: NEGATIVE

## 2021-04-26 ENCOUNTER — Encounter: Payer: Self-pay | Admitting: Family Medicine

## 2021-04-29 ENCOUNTER — Other Ambulatory Visit: Payer: Self-pay

## 2021-04-29 ENCOUNTER — Ambulatory Visit (INDEPENDENT_AMBULATORY_CARE_PROVIDER_SITE_OTHER): Payer: 59 | Admitting: Family Medicine

## 2021-04-29 DIAGNOSIS — I10 Essential (primary) hypertension: Secondary | ICD-10-CM | POA: Diagnosis not present

## 2021-04-29 MED ORDER — HYDROCHLOROTHIAZIDE 25 MG PO TABS: 25.0000 mg | ORAL_TABLET | Freq: Every day | ORAL | 3 refills | Status: DC

## 2021-04-29 NOTE — Patient Instructions (Addendum)
Keep f/u appointment ? ?Stop spironolactone ? ?New is HCTZ 25 mg one daily ? ?Take two OTC potassium tabs 99 mg every day ? ?It is important that you exercise regularly at least 30 minutes 5 times a week. If you develop chest pain, have severe difficulty breathing, or feel very tired, stop exercising immediately and seek medical attention  ? ?Thanks for choosing [facility], we consider it a privelige to serve you. ? ? ? ? ? ? ?

## 2021-04-29 NOTE — Telephone Encounter (Signed)
Virtual visit scheduled.  

## 2021-04-29 NOTE — Progress Notes (Unsigned)
Virtual Visit via Telephone Note  I connected with Catherine Mclean on 04/29/21 at 11:40 AM EST by telephone and verified that I am speaking with the correct person using two identifiers.  Location: Patient: home Provider: office   I discussed the limitations, risks, security and privacy concerns of performing an evaluation and management service by telephone and the availability of in person appointments. I also discussed with the patient that there may be a patient responsible charge related to this service. The patient expressed understanding and agreed to proceed.   History of Present Illness:    Observations/Objective:   Assessment and Plan:   Follow Up Instructions:    I discussed the assessment and treatment plan with the patient. The patient was provided an opportunity to ask questions and all were answered. The patient agreed with the plan and demonstrated an understanding of the instructions.   The patient was advised to call back or seek an in-person evaluation if the symptoms worsen or if the condition fails to improve as anticipated.  I provided *** minutes of non-face-to-face time during this encounter.   Tula Nakayama, MD

## 2021-05-11 ENCOUNTER — Encounter: Payer: Self-pay | Admitting: Family Medicine

## 2021-05-11 NOTE — Assessment & Plan Note (Signed)
Intolerant of spironolactone, d/c same start higher dose HCTZ with 4 to 6 week f/u as planned, alsorake OTC potassium 99 mg d x 2 daily ?DASH diet and commitment to daily physical activity for a minimum of 30 minutes discussed and encouraged, as a part of hypertension management. ?The importance of attaining a healthy weight is also discussed. ? ?BP/Weight 04/17/2021 10/17/2020 03/04/2020 10/22/2019 07/03/2019 04/25/2019 02/26/2019  ?Systolic BP 883 374 451 460 124 124 124  ?Diastolic BP 78 72 70 67 78 78 78  ?Wt. (Lbs) 134 138.12 - 145.4 146 146 146  ?BMI 23.74 25.26 - 26.59 26.7 26.7 26.7  ? ? ? ? ?

## 2021-05-28 ENCOUNTER — Ambulatory Visit: Payer: 59 | Admitting: Family Medicine

## 2021-06-24 ENCOUNTER — Other Ambulatory Visit: Payer: Self-pay | Admitting: Family Medicine

## 2021-06-26 ENCOUNTER — Other Ambulatory Visit: Payer: Self-pay | Admitting: Family Medicine

## 2021-06-26 ENCOUNTER — Telehealth: Payer: Self-pay | Admitting: Family Medicine

## 2021-06-26 MED ORDER — ZOLPIDEM TARTRATE 5 MG PO TABS: 5.0000 mg | ORAL_TABLET | Freq: Every day | ORAL | 0 refills | Status: DC

## 2021-06-26 NOTE — Telephone Encounter (Signed)
Pt requests refill  

## 2021-06-26 NOTE — Telephone Encounter (Signed)
Patient needs refill on  ? ?zolpidem (AMBIEN) 5 MG tablet ?

## 2021-06-26 NOTE — Telephone Encounter (Signed)
Done

## 2021-07-01 ENCOUNTER — Encounter: Payer: Self-pay | Admitting: Family Medicine

## 2021-07-01 ENCOUNTER — Ambulatory Visit (INDEPENDENT_AMBULATORY_CARE_PROVIDER_SITE_OTHER): Payer: 59 | Admitting: Family Medicine

## 2021-07-01 VITALS — BP 145/73 | HR 59 | Ht 63.0 in | Wt 141.0 lb

## 2021-07-01 DIAGNOSIS — F5104 Psychophysiologic insomnia: Secondary | ICD-10-CM

## 2021-07-01 DIAGNOSIS — I10 Essential (primary) hypertension: Secondary | ICD-10-CM

## 2021-07-01 DIAGNOSIS — Z1231 Encounter for screening mammogram for malignant neoplasm of breast: Secondary | ICD-10-CM | POA: Diagnosis not present

## 2021-07-01 DIAGNOSIS — E663 Overweight: Secondary | ICD-10-CM

## 2021-07-01 DIAGNOSIS — R058 Other specified cough: Secondary | ICD-10-CM

## 2021-07-01 DIAGNOSIS — E782 Mixed hyperlipidemia: Secondary | ICD-10-CM

## 2021-07-01 DIAGNOSIS — R7989 Other specified abnormal findings of blood chemistry: Secondary | ICD-10-CM

## 2021-07-01 DIAGNOSIS — E559 Vitamin D deficiency, unspecified: Secondary | ICD-10-CM

## 2021-07-01 MED ORDER — MONTELUKAST SODIUM 10 MG PO TABS: 10.0000 mg | ORAL_TABLET | Freq: Every day | ORAL | 3 refills | Status: DC

## 2021-07-01 NOTE — Progress Notes (Signed)
? Catherine Mclean     MRN: [id number]      DOB: [birth year] ? ? ?HPI ?[name] is here for follow up and re-evaluation of chronic medical conditions, medication management and review of any available recent lab and radiology data.  ?Preventive health is updated, specifically  Cancer screening and Immunization.   ?Questions or concerns regarding consultations or procedures which the PT has had in the interim are  addressed. ?The PT denies any adverse reactions to current medications since the last visit.  ?C/o incontinence of urine with cough or sneeze, no burning or frequency  ? ?ROS ?Denies recent fever or chills. ?Denies sinus pressure, nasal congestion, ear pain or sore throat. ?Denies chest congestion, productive cough or wheezing. ?Denies chest pains, palpitations and leg swelling ?Denies abdominal pain, nausea, vomiting,diarrhea or constipation.   ?Denies dysuria, frequency, hesitancy . ?Denies joint pain, swelling and limitation in mobility. ?Denies headaches, seizures, numbness, or tingling. ?Denies depression, anxiety or uncontrolled insomnia. ?Denies skin break down or rash. ? ? ?PE ? ?BP (!) 145/73   Pulse (!) 59   Ht '5\' 3"'$  (1.6 m)   Wt 141 lb (64 kg)   SpO2 97%   BMI 24.98 kg/m?  ? ?Patient alert and oriented and in no cardiopulmonary distress. ? ?HEENT: No facial asymmetry, EOMI,     Neck supple . ? ?Chest: Clear to auscultation bilaterally. ? ?CVS: S1, S2 no murmurs, no S3.Regular rate. ? ?ABD: Soft non tender.  ? ?Ext: No edema ? ?MS: Adequate ROM spine, shoulders, hips and knees. ? ?Skin: Intact, no ulcerations or rash noted. ? ?Psych: Good eye contact, normal affect. Memory intact not anxious or depressed appearing. ? ?CNS: CN 2-12 intact, power,  normal throughout.no focal deficits noted. ? ? ?Assessment & Plan ? ?Essential hypertension ?Elevated and uncontrolled, reports good numbers at home. Re eval in 4 monhts, change diet and commit to regular exercise ?DASH diet and commitment to daily  physical activity for a minimum of 30 minutes discussed and encouraged, as a part of hypertension management. ?The importance of attaining a healthy weight is also discussed. ? ? ?  07/01/2021  ?  8:09 AM 04/17/2021  ? 10:12 AM 04/17/2021  ? 10:07 AM 10/17/2020  ? 10:53 AM 10/17/2020  ? 10:35 AM 03/04/2020  ?  1:50 PM 10/22/2019  ?  8:57 AM  ?BP/Weight  ?Systolic BP 938 101 751 025 162 131 139  ?Diastolic BP 73 78 77 72 78 70 67  ?Wt. (Lbs) 141  134  138.12  145.4  ?BMI 24.98 kg/m2  23.74 kg/m2  25.26 kg/m2  26.59 kg/m2  ? ? ? ? ? ?Insomnia ?Controlled, no change in medication ?Sleep hygiene reviewed and written information offered also. ?Prescription sent for  medication needed. ? ? ?Overweight (BMI 25.0-29.9) ? ?Patient re-educated about  the importance of commitment to a  minimum of 150 minutes of exercise per week as able. ? ?The importance of healthy food choices with portion control discussed, as well as eating regularly and within a 12 hour window most days. ?The need to choose "clean , green" food 50 to 75% of the time is discussed, as well as to make water the primary drink and set a goal of 64 ounces water daily. ? ?  ? ?  07/01/2021  ?  8:09 AM 04/17/2021  ? 10:07 AM 10/17/2020  ? 10:35 AM  ?Weight /BMI  ?Weight 141 lb 134 lb 138 lb 1.9 oz  ?Height '5\' 3"'$  (  1.6 m) '5\' 3"'$  (1.6 m) '5\' 2"'$  (1.575 m)  ?BMI 24.98 kg/m2 23.74 kg/m2 25.26 kg/m2  ? ? ? ? ?Allergic cough ?Controlled, no change in medication ? ? ?Hyperlipemia ?Hyperlipidemia:Low fat diet discussed and encouraged. ? ? ?Lipid Panel  ?Lab Results  ?Component Value Date  ? CHOL 232 (H) 10/17/2020  ? HDL 56 10/17/2020  ? [place] 156 (H) 10/17/2020  ? TRIG 110 10/17/2020  ? CHOLHDL 4.1 10/17/2020  ? ?Updated lab needed at/ before next visit. ?Needs to reduce fried and fatty foods ? ? ? ?Vitamin D deficiency ?Updated lab needed at/ before next visit. ? ? ?

## 2021-07-01 NOTE — Patient Instructions (Signed)
F/U in 4 months, call if you need me sooner ? ?Blood pressure still elevated at visit, please bring your bP cuff to the visit ? ?Please schedule mammogram at checkout ? ?Check blood pressure weekly and if you are consistently getting the upper number at 135 or more please call for sooner appointment ? ?Please cut back / cut out sodas and canned foods and salty foods, increase vegetables and fruit fresh or frozen ? ?Please get fasting CBC, lipid, cmp and eGFR, TSH and vit D 1 week before your next visir ? ?It is important that you exercise regularly at least 30 minutes 5 times a week. If you develop chest pain, have severe difficulty breathing, or feel very tired, stop exercising immediately and seek medical attention  ? ?Thanks for choosing [facility], we consider it a privelige to serve you. ? ?

## 2021-07-05 DIAGNOSIS — E559 Vitamin D deficiency, unspecified: Secondary | ICD-10-CM | POA: Insufficient documentation

## 2021-07-05 MED ORDER — ZOLPIDEM TARTRATE 5 MG PO TABS: 5.0000 mg | ORAL_TABLET | Freq: Every evening | ORAL | 4 refills | Status: DC | PRN

## 2021-07-05 NOTE — Assessment & Plan Note (Signed)
?  Patient re-educated about  the importance of commitment to a  minimum of 150 minutes of exercise per week as able. ? ?The importance of healthy food choices with portion control discussed, as well as eating regularly and within a 12 hour window most days. ?The need to choose "clean , green" food 50 to 75% of the time is discussed, as well as to make water the primary drink and set a goal of 64 ounces water daily. ? ?  ? ?  07/01/2021  ?  8:09 AM 04/17/2021  ? 10:07 AM 10/17/2020  ? 10:35 AM  ?Weight /BMI  ?Weight 141 lb 134 lb 138 lb 1.9 oz  ?Height '5\' 3"'$  (1.6 m) '5\' 3"'$  (1.6 m) '5\' 2"'$  (1.575 m)  ?BMI 24.98 kg/m2 23.74 kg/m2 25.26 kg/m2  ? ? ? ?

## 2021-07-05 NOTE — Assessment & Plan Note (Signed)
Hyperlipidemia:Low fat diet discussed and encouraged. ? ? ?Lipid Panel  ?Lab Results  ?Component Value Date  ? CHOL 232 (H) 10/17/2020  ? HDL 56 10/17/2020  ? [place] 156 (H) 10/17/2020  ? TRIG 110 10/17/2020  ? CHOLHDL 4.1 10/17/2020  ? ?Updated lab needed at/ before next visit. ?Needs to reduce fried and fatty foods ? ? ?

## 2021-07-05 NOTE — Assessment & Plan Note (Signed)
Elevated and uncontrolled, reports good numbers at home. Re eval in 4 monhts, change diet and commit to regular exercise ?DASH diet and commitment to daily physical activity for a minimum of 30 minutes discussed and encouraged, as a part of hypertension management. ?The importance of attaining a healthy weight is also discussed. ? ? ?  07/01/2021  ?  8:09 AM 04/17/2021  ? 10:12 AM 04/17/2021  ? 10:07 AM 10/17/2020  ? 10:53 AM 10/17/2020  ? 10:35 AM 03/04/2020  ?  1:50 PM 10/22/2019  ?  8:57 AM  ?BP/Weight  ?Systolic BP 060 156 153 794 162 131 139  ?Diastolic BP 73 78 77 72 78 70 67  ?Wt. (Lbs) 141  134  138.12  145.4  ?BMI 24.98 kg/m2  23.74 kg/m2  25.26 kg/m2  26.59 kg/m2  ? ? ? ? ?

## 2021-07-05 NOTE — Assessment & Plan Note (Signed)
Updated lab needed at/ before next visit.   

## 2021-07-05 NOTE — Assessment & Plan Note (Signed)
Controlled, no change in medication  

## 2021-07-05 NOTE — Assessment & Plan Note (Signed)
Controlled, no change in medication Sleep hygiene reviewed and written information offered also. Prescription sent for  medication needed.  

## 2021-07-10 ENCOUNTER — Ambulatory Visit (HOSPITAL_COMMUNITY)
Admission: RE | Admit: 2021-07-10 | Discharge: 2021-07-10 | Disposition: A | Discharge status: Home / Self Care | Payer: 59 | Source: Ambulatory Visit | Attending: Family Medicine | Admitting: Family Medicine

## 2021-07-10 DIAGNOSIS — Z1231 Encounter for screening mammogram for malignant neoplasm of breast: Secondary | ICD-10-CM | POA: Diagnosis not present

## 2021-08-15 ENCOUNTER — Other Ambulatory Visit: Payer: Self-pay | Admitting: Family Medicine

## 2021-11-04 ENCOUNTER — Encounter: Payer: Self-pay | Admitting: Family Medicine

## 2021-11-04 ENCOUNTER — Ambulatory Visit (INDEPENDENT_AMBULATORY_CARE_PROVIDER_SITE_OTHER): Payer: 59 | Admitting: Family Medicine

## 2021-11-04 VITALS — BP 150/80 | HR 60 | Resp 16 | Ht 62.0 in | Wt 141.0 lb

## 2021-11-04 DIAGNOSIS — Z23 Encounter for immunization: Secondary | ICD-10-CM | POA: Diagnosis not present

## 2021-11-04 DIAGNOSIS — G8929 Other chronic pain: Secondary | ICD-10-CM

## 2021-11-04 DIAGNOSIS — M25562 Pain in left knee: Secondary | ICD-10-CM

## 2021-11-04 DIAGNOSIS — N393 Stress incontinence (female) (male): Secondary | ICD-10-CM | POA: Diagnosis not present

## 2021-11-04 DIAGNOSIS — I10 Essential (primary) hypertension: Secondary | ICD-10-CM | POA: Diagnosis not present

## 2021-11-04 DIAGNOSIS — F5104 Psychophysiologic insomnia: Secondary | ICD-10-CM

## 2021-11-04 MED ORDER — METHYLPREDNISOLONE ACETATE 80 MG/ML IJ SUSP
80.0000 mg | Freq: Once | INTRAMUSCULAR | Status: AC
  Administered 2021-11-04: 80 mg via INTRAMUSCULAR

## 2021-11-04 MED ORDER — PREDNISONE 10 MG PO TABS: 10.0000 mg | ORAL_TABLET | Freq: Two times a day (BID) | ORAL | 0 refills | Status: DC

## 2021-11-04 MED ORDER — MIRABEGRON ER 25 MG PO TB24: 25.0000 mg | ORAL_TABLET | Freq: Every day | ORAL | 2 refills | Status: DC

## 2021-11-04 MED ORDER — OLMESARTAN MEDOXOMIL 20 MG PO TABS: 20.0000 mg | ORAL_TABLET | Freq: Every day | ORAL | 2 refills | Status: DC

## 2021-11-04 NOTE — Progress Notes (Signed)
   Catherine Mclean     MRN: [id number]      DOB: 10-29-1960   HPI Catherine Mclean is here for follow up and re-evaluation of chronic medical conditions, medication management and review of any available recent lab and radiology data.  Preventive health is updated, specifically  Cancer screening and Immunization.   Questions or concerns regarding consultations or procedures which the PT has had in the interim are  addressed. The PT denies any adverse reactions to current medications since the last visit.  C/o uncontrolled incontinece of urine C/o left knee pain  ROS Denies recent fever or chills. Denies sinus pressure, nasal congestion, ear pain or sore throat. Denies chest congestion, productive cough or wheezing. Denies chest pains, palpitations and leg swelling Denies abdominal pain, nausea, vomiting,diarrhea or constipation.   .  Denies headaches, seizures, numbness, or tingling. Denies depression, anxiety or insomnia. Denies skin break down or rash.   PE  BP (!) 150/80   Pulse 60   Resp 16   Ht '5\' 2"'$  (1.575 m)   Wt 141 lb (64 kg)   SpO2 99%   BMI 25.79 kg/m   Patient alert and oriented and in no cardiopulmonary distress.  HEENT: No facial asymmetry, EOMI,     Neck supple .  Chest: Clear to auscultation bilaterally.  CVS: S1, S2 no murmurs, no S3.Regular rate.  ABD: Soft non tender.   Ext: No edema  MS: Adequate ROM spine, shoulders, hips and reduced in left  knee.  Skin: Intact, no ulcerations or rash noted.  Psych: Good eye contact, normal affect. Memory intact not anxious or depressed appearing.  CNS: CN 2-12 intact, power,  normal throughout.no focal deficits noted.   Assessment & Plan Essential hypertension Uncontrolled , dose increase and re eval DASH diet and commitment to daily physical activity for a minimum of 30 minutes discussed and encouraged, as a part of hypertension management. The importance of attaining a healthy weight is also  discussed.     11/04/2021    9:24 AM 11/04/2021    8:58 AM 07/01/2021    8:09 AM 04/17/2021   10:12 AM 04/17/2021   10:07 AM 10/17/2020   10:53 AM 10/17/2020   10:35 AM  BP/Weight  Systolic BP 048 889 169 450 388 828 003  Diastolic BP 80 73 73 78 77 72 78  Wt. (Lbs)  141 141  134  138.12  BMI  25.79 kg/m2 24.98 kg/m2  23.74 kg/m2  25.26 kg/m2       Chronic pain of left knee Uncontrolled.depo medrol 80 mg  administered IM in the office , to be followed by a short course of oral prednisone    Urinary incontinence Uncontrolled ,med prescribed and she is to continue exercises   Insomnia Controlled, no change in medication Sleep hygiene reviewed and written information offered also. Prescription sent for  medication needed.

## 2021-11-04 NOTE — Patient Instructions (Addendum)
F/U in 7 weeks, re evaluate blood pressure, call if you need me  sooner  Flu vaccine today  Labs ordered in May to be drawn today (OK that you had creamer)   Depomedrol 80 mg IM in office for left knee pain , followed by 5 day course of prednisone  New medication for incontinence, one daily, mirabegron 25 mg  New ADDITIONAL tablet for blood pressure which is still high, olmesartan 30 mg one daily, cONTINUE HCTZ 25 mg one daily as before   Thanks for choosing [facility], we consider it a privelige to serve you.

## 2021-11-05 ENCOUNTER — Other Ambulatory Visit: Payer: Self-pay | Admitting: Family Medicine

## 2021-11-05 ENCOUNTER — Encounter: Payer: Self-pay | Admitting: Family Medicine

## 2021-11-05 LAB — CMP14+EGFR
ALT: 7 IU/L (ref 0–32)
AST: 30 IU/L (ref 0–40)
Albumin/Globulin Ratio: 1.7 (ref 1.2–2.2)
Albumin: 4.8 g/dL (ref 3.9–4.9)
Alkaline Phosphatase: 101 IU/L (ref 44–121)
BUN/Creatinine Ratio: 13 (ref 12–28)
BUN: 13 mg/dL (ref 8–27)
Bilirubin Total: 0.2 mg/dL (ref 0.0–1.2)
CO2: 24 mmol/L (ref 20–29)
Calcium: 10 mg/dL (ref 8.7–10.3)
Chloride: 101 mmol/L (ref 96–106)
Creatinine, Ser: 0.97 mg/dL (ref 0.57–1.00)
Globulin, Total: 2.9 g/dL (ref 1.5–4.5)
Glucose: 88 mg/dL (ref 70–99)
Potassium: 4 mmol/L (ref 3.5–5.2)
Sodium: 140 mmol/L (ref 134–144)
Total Protein: 7.7 g/dL (ref 6.0–8.5)
eGFR: 66 mL/min/{1.73_m2} (ref >59)

## 2021-11-05 LAB — CBC
Hematocrit: 42.9 % (ref 34.0–46.6)
Hemoglobin: 13.8 g/dL (ref 11.1–15.9)
MCH: 27.4 pg (ref 26.6–33.0)
MCHC: 32.2 g/dL (ref 31.5–35.7)
MCV: 85 fL (ref 79–97)
Platelets: 199 10*3/uL (ref 150–450)
RBC: 5.03 x10E6/uL (ref 3.77–5.28)
RDW: 13.9 % (ref 11.7–15.4)
WBC: 7.7 10*3/uL (ref 3.4–10.8)

## 2021-11-05 LAB — VITAMIN D 25 HYDROXY (VIT D DEFICIENCY, FRACTURES): Vit D, 25-Hydroxy: 51.7 ng/mL (ref 30.0–100.0)

## 2021-11-05 LAB — LIPID PANEL
Chol/HDL Ratio: 4.5 ratio — ABNORMAL HIGH (ref 0.0–4.4)
Cholesterol, Total: 249 mg/dL — ABNORMAL HIGH (ref 100–199)
HDL: 55 mg/dL (ref >39)
LDL Chol Calc (NIH): 177 mg/dL — ABNORMAL HIGH (ref 0–99)
Triglycerides: 96 mg/dL (ref 0–149)
VLDL Cholesterol Cal: 17 mg/dL (ref 5–40)

## 2021-11-05 LAB — TSH: TSH: 0.813 u[IU]/mL (ref 0.450–4.500)

## 2021-11-05 MED ORDER — ROSUVASTATIN CALCIUM 5 MG PO TABS: 5.0000 mg | ORAL_TABLET | Freq: Every day | ORAL | 5 refills | Status: DC

## 2021-11-09 ENCOUNTER — Encounter: Payer: Self-pay | Admitting: Family Medicine

## 2021-11-09 DIAGNOSIS — R32 Unspecified urinary incontinence: Secondary | ICD-10-CM | POA: Insufficient documentation

## 2021-11-09 NOTE — Assessment & Plan Note (Signed)
Controlled, no change in medication Sleep hygiene reviewed and written information offered also. Prescription sent for  medication needed.  

## 2021-11-09 NOTE — Assessment & Plan Note (Signed)
Uncontrolled. depo medrol 80 mg administered IM in the office , to be followed by a short course of oral prednisone.  

## 2021-11-09 NOTE — Assessment & Plan Note (Signed)
Uncontrolled , dose increase and re eval DASH diet and commitment to daily physical activity for a minimum of 30 minutes discussed and encouraged, as a part of hypertension management. The importance of attaining a healthy weight is also discussed.     11/04/2021    9:24 AM 11/04/2021    8:58 AM 07/01/2021    8:09 AM 04/17/2021   10:12 AM 04/17/2021   10:07 AM 10/17/2020   10:53 AM 10/17/2020   10:35 AM  BP/Weight  Systolic BP 948 546 270 350 093 818 299  Diastolic BP 80 73 73 78 77 72 78  Wt. (Lbs)  141 141  134  138.12  BMI  25.79 kg/m2 24.98 kg/m2  23.74 kg/m2  25.26 kg/m2

## 2021-11-09 NOTE — Assessment & Plan Note (Signed)
Uncontrolled ,med prescribed and she is to continue exercises

## 2021-12-30 ENCOUNTER — Ambulatory Visit (INDEPENDENT_AMBULATORY_CARE_PROVIDER_SITE_OTHER): Payer: 59 | Admitting: Internal Medicine

## 2021-12-30 ENCOUNTER — Ambulatory Visit (INDEPENDENT_AMBULATORY_CARE_PROVIDER_SITE_OTHER): Payer: 59 | Admitting: Family Medicine

## 2021-12-30 ENCOUNTER — Encounter: Payer: Self-pay | Admitting: Internal Medicine

## 2021-12-30 ENCOUNTER — Encounter: Payer: Self-pay | Admitting: Family Medicine

## 2021-12-30 VITALS — BP 124/80 | HR 90 | Resp 16 | Ht 63.0 in | Wt 139.0 lb

## 2021-12-30 VITALS — BP 124/80 | HR 90 | Ht 63.0 in | Wt 139.0 lb

## 2021-12-30 DIAGNOSIS — E559 Vitamin D deficiency, unspecified: Secondary | ICD-10-CM

## 2021-12-30 DIAGNOSIS — M25562 Pain in left knee: Secondary | ICD-10-CM

## 2021-12-30 DIAGNOSIS — F5104 Psychophysiologic insomnia: Secondary | ICD-10-CM | POA: Diagnosis not present

## 2021-12-30 DIAGNOSIS — M2352 Chronic instability of knee, left knee: Secondary | ICD-10-CM

## 2021-12-30 DIAGNOSIS — I10 Essential (primary) hypertension: Secondary | ICD-10-CM

## 2021-12-30 DIAGNOSIS — G8929 Other chronic pain: Secondary | ICD-10-CM

## 2021-12-30 DIAGNOSIS — E782 Mixed hyperlipidemia: Secondary | ICD-10-CM

## 2021-12-30 MED ORDER — MIRABEGRON ER 50 MG PO TB24: 50.0000 mg | ORAL_TABLET | Freq: Every day | ORAL | 3 refills | Status: DC

## 2021-12-30 MED ORDER — OLMESARTAN MEDOXOMIL 20 MG PO TABS: 20.0000 mg | ORAL_TABLET | Freq: Every day | ORAL | 3 refills | Status: DC

## 2021-12-30 MED ORDER — TRIAMCINOLONE ACETONIDE 40 MG/ML IJ SUSP
40.0000 mg | Freq: Once | INTRAMUSCULAR | Status: AC
  Administered 2021-12-30: 40 mg via INTRA_ARTICULAR

## 2021-12-30 MED ORDER — PREDNISONE 10 MG PO TABS: 10.0000 mg | ORAL_TABLET | Freq: Two times a day (BID) | ORAL | 0 refills | Status: DC

## 2021-12-30 MED ORDER — ALPRAZOLAM 0.25 MG PO TABS: 0.2500 mg | ORAL_TABLET | Freq: Two times a day (BID) | ORAL | 2 refills | Status: DC | PRN

## 2021-12-30 NOTE — Assessment & Plan Note (Signed)
Increase pain and instability, states IM injection helped but has worn off, will refer for intra articular injection

## 2021-12-30 NOTE — Assessment & Plan Note (Signed)
Controlled, no change in medication DASH diet and commitment to daily physical activity for a minimum of 30 minutes discussed and encouraged, as a part of hypertension management. The importance of attaining a healthy weight is also discussed.     12/30/2021    9:43 AM 12/30/2021    9:04 AM 12/30/2021    8:46 AM 11/04/2021    9:24 AM 11/04/2021    8:58 AM 07/01/2021    8:09 AM 04/17/2021   10:12 AM  BP/Weight  Systolic BP 241 146 431 427 670 110 034  Diastolic BP 80 80 73 80 73 73 78  Wt. (Lbs) 139  139  141 141   BMI 24.62 kg/m2  24.62 kg/m2  25.79 kg/m2 24.98 kg/m2

## 2021-12-30 NOTE — Progress Notes (Signed)
     CC: Knee Pain (Left knee pain. PCP wants her to have a knee injection )    HPI:Ms.MARJORIE DEPREY is a 61 y.o. female who presents for evaluation of left knee pain. For the details of today's visit, please refer to the assessment and plan.  Procedure Note   Indication:  Left knee pain    Operators: Tamsen Snider, MD   The patient was provided with risks, benefits, and alternatives to intraarticular injection. He/She consented to intraarticular knee injection for left knee pain.  After a time out was preformed, the knee was prepped in a sterile fashion. Cold spray was applied to the skin over the insertion site (lateral inferior patellar aspect of the knee while in the seated position). A mixture of 1 cc 1% lidocaine and 40 mg of Kenalog was injected into the knee using a 27 gauge*** needle.  The right/left *** knee space was entered with/without*** difficulty, entered successfully after *** attempts. The patient tolerated the procedures well without complication. ***     Past Medical History:  Diagnosis Date  . Anxiety   . Depression   . Hypertension      Physical Exam: Vitals:   12/30/21 0943  BP: 124/80  Pulse: 90  Resp: 16  Weight: 139 lb (63 kg)  Height: '5\' 3"'$  (1.6 m)     Physical Exam   Assessment & Plan:   No problem-specific Assessment & Plan notes found for this encounter.    Lorene Dy, MD

## 2021-12-30 NOTE — Patient Instructions (Signed)
Thank you for trusting me with your care. To recap, today we discussed the following:   I am glad your knee is already feeling better. If it becomes red hot or swollen you need to contact us.

## 2021-12-30 NOTE — Assessment & Plan Note (Signed)
Hyperlipidemia:Low fat diet discussed and encouraged.   Lipid Panel  Lab Results  Component Value Date   CHOL 249 (H) 11/04/2021   HDL 55 11/04/2021   LDLCALC 177 (H) 11/04/2021   TRIG 96 11/04/2021   CHOLHDL 4.5 (H) 11/04/2021     Updated lab needed at/ before next visit.

## 2021-12-30 NOTE — Patient Instructions (Addendum)
Annual exam February 20 call if you need me sooner.    You will see a provider in the office at 9:40 today for an injection in your knee.5 day course of prednisone is also prescribed  New medication to start at bedtime for sleep.  Recommend RSV vaccine  Take OTC vit D3 1000 IU every day  Higher dose of medication for incontinence is sent in.  Blood pressure is excellent.  Fasting CBC lipid CMP and EGFR TSH and vitamin D level to be checked 5 days before your next visit.  NO SOUND OR LIGHT in the room when sleeping, TV OFF  It is important that you exercise regularly at least 30 minutes 5 times a week. If you develop chest pain, have severe difficulty breathing, or feel very tired, stop exercising immediately and seek medical attention  Think about what you will eat, plan ahead. Choose " clean, green, fresh or frozen" over canned, processed or packaged foods which are more sugary, salty and fatty. 70 to 75% of food eaten should be vegetables and fruit. Three meals at set times with snacks allowed between meals, but they must be fruit or vegetables. Aim to eat over a 12 hour period , example 7 am to 7 pm, and STOP after  your last meal of the day. Drink water,generally about 64 ounces per day, no other drink is as healthy. Fruit juice is best enjoyed in a healthy way, by EATING the fruit.

## 2021-12-30 NOTE — Assessment & Plan Note (Signed)
Sleep hygiene reviewed and written information offered also. Prescription sent for  medication needed.  

## 2021-12-30 NOTE — Progress Notes (Signed)
   Catherine Mclean     MRN: [id number]      DOB: 07-12-1960   HPI Catherine Mclean is here for follow up and re-evaluation of chronic medical conditions, medication management and review of any available recent lab and radiology data.  Preventive health is updated, specifically  Cancer screening and Immunization.   Questions or concerns regarding consultations or procedures which the PT has had in the interim are  addressed. The PT denies any adverse reactions to current medications since the last visit.  C/o increased left knee pain with instability    ROS Denies recent fever or chills. Denies sinus pressure, nasal congestion, ear pain or sore throat. Denies chest congestion, productive cough or wheezing. Denies chest pains, palpitations and leg swelling Denies abdominal pain, nausea, vomiting,diarrhea or constipation.   Denies dysuria, frequency, hesitancy or incontinence. . Denies headaches, seizures, numbness, or tingling. Denies depression, anxiety or insomnia. Denies skin break down or rash.   PE  BP 124/80   Pulse 90   Ht '5\' 3"'$  (1.6 m)   Wt 139 lb (63 kg)   SpO2 96%   BMI 24.62 kg/m   Patient alert and oriented and in no cardiopulmonary distress.  HEENT: No facial asymmetry, EOMI,     Neck supple .  Chest: Clear to auscultation bilaterally.  CVS: S1, S2 no murmurs, no S3.Regular rate.  ABD: Soft non tender.   Ext: No edema  MS: Adequate ROM spine, shoulders, hips and reduced in left  knee.  Skin: Intact, no ulcerations or rash noted.  Psych: Good eye contact, normal affect. Memory intact not anxious or depressed appearing.  CNS: CN 2-12 intact, power,  normal throughout.no focal deficits noted.   Assessment & Plan  Essential hypertension Controlled, no change in medication DASH diet and commitment to daily physical activity for a minimum of 30 minutes discussed and encouraged, as a part of hypertension management. The importance of attaining a healthy weight  is also discussed.     12/30/2021    9:43 AM 12/30/2021    9:04 AM 12/30/2021    8:46 AM 11/04/2021    9:24 AM 11/04/2021    8:58 AM 07/01/2021    8:09 AM 04/17/2021   10:12 AM  BP/Weight  Systolic BP 233 007 622 633 354 562 563  Diastolic BP 80 80 73 80 73 73 78  Wt. (Lbs) 139  139  141 141   BMI 24.62 kg/m2  24.62 kg/m2  25.79 kg/m2 24.98 kg/m2        Recurrent left knee instability Increase pain and instability, states IM injection helped but has worn off, will refer for intra articular injection  Insomnia Sleep hygiene reviewed and written information offered also. Prescription sent for  medication needed.   Hyperlipemia Hyperlipidemia:Low fat diet discussed and encouraged.   Lipid Panel  Lab Results  Component Value Date   CHOL 249 (H) 11/04/2021   HDL 55 11/04/2021   LDLCALC 177 (H) 11/04/2021   TRIG 96 11/04/2021   CHOLHDL 4.5 (H) 11/04/2021     Updated lab needed at/ before next visit.

## 2021-12-31 NOTE — Assessment & Plan Note (Signed)
Patient presents with chronic pain in left knee. Pain is in anterior knee and worse with walking. Reviewed MRI in 2019 negative for meniscal or ligament tear and very mild patellofemoral degenerative disease. No history of patellar dislocation. No recent trauma. Not red , hot, or swollen.   Assessment/Plan: Chronic pain in left knee, treating with steroid injection today.   Procedure Note   Indication:  Left knee pain    Operators: Tamsen Snider, MD   The patient was provided with risks, benefits, and alternatives to intraarticular injection. She consented to intraarticular knee injection for left knee pain.  After a time out was preformed, the knee was prepped in a sterile fashion. Cold spray was applied to the skin over the insertion site (lateral inferior patellar aspect of the knee while in the seated position). A mixture of 1 cc 1% lidocaine and 40 mg of Kenalog was injected into the knee using a 23 gauge needle.  The left knee space was entered without difficulty, entered successfully 1 attempt. The patient tolerated the procedures well without complication.

## 2022-01-05 ENCOUNTER — Encounter: Payer: Self-pay | Admitting: Internal Medicine

## 2022-01-05 ENCOUNTER — Ambulatory Visit (INDEPENDENT_AMBULATORY_CARE_PROVIDER_SITE_OTHER): Payer: 59 | Admitting: Internal Medicine

## 2022-01-05 VITALS — BP 121/72 | HR 56 | Resp 16 | Ht 63.0 in | Wt 140.0 lb

## 2022-01-05 DIAGNOSIS — M1711 Unilateral primary osteoarthritis, right knee: Secondary | ICD-10-CM | POA: Insufficient documentation

## 2022-01-05 DIAGNOSIS — L989 Disorder of the skin and subcutaneous tissue, unspecified: Secondary | ICD-10-CM | POA: Diagnosis not present

## 2022-01-05 DIAGNOSIS — L84 Corns and callosities: Secondary | ICD-10-CM | POA: Diagnosis not present

## 2022-01-05 MED ORDER — TRIAMCINOLONE ACETONIDE 40 MG/ML IJ SUSP
40.0000 mg | Freq: Once | INTRAMUSCULAR | Status: AC
  Administered 2022-01-05: 40 mg via INTRA_ARTICULAR

## 2022-01-05 NOTE — Assessment & Plan Note (Addendum)
Patient had a bamboo stick in her right foot almost 20 years ago and reports it was removed by her PCP. The area has become keratotic and painful. She has shaved it at home. No drainage or erythema. Patient is not a diabetic. Referral to podiatry.

## 2022-01-05 NOTE — Assessment & Plan Note (Signed)
Patient presents with knee pain involving the  right knee. Onset of the symptoms was several years ago. Inciting event: none known. Current symptoms include pain located deep in knee . Pain is aggravated by walking.  Patient has osteoarthritis of left knee on MRI. Pain responded well to knee injections.   Assessment/Plan: Osteoarthritis of right knee. Steroid Injection in Right knee today.   Procedure Note   Indication:  Right Knee Pain    Operators: Tamsen Snider, MD   The patient was provided with risks, benefits, and alternatives to intraarticular injection. He/She consented to intraarticular knee injection for right knee pain.  After a time out was preformed, the knee was prepped in a sterile fashion. Cold spray was applied to the skin over the insertion site (lateral inferior patellar aspect of the knee while in the seated position). A mixture of 1 cc 1% lidocaine and 40 mg of Kenalog was injected into the knee using a 23 gauge needle.  The right knee space was entered without difficulty, entered successfully after 1 attempt. The patient tolerated the procedures well without complication.

## 2022-01-05 NOTE — Patient Instructions (Signed)
Thank you for trusting me with your care. To recap, today we discussed the following:   Plantar Foot lesion - Ambulatory referral to Podiatry   Right Knee Osteoarthritis - Knee injection today

## 2022-01-05 NOTE — Progress Notes (Signed)
     CC: Knee Pain (Wants a shot in the right knee for pain because it really helped her left knee. Pain level in right knee today is 6/10 when walking ) and Foot Problem (On the bottom of right foot there is a place she wants checked )    HPI:[name] is a 61 y.o. female who presents for evaluation of right knee pain and . For the details of today's visit, please refer to the assessment and plan.   Past Medical History:  Diagnosis Date   Anxiety    Depression    Hypertension      Physical Exam: Vitals:   01/05/22 0941  BP: 121/72  Pulse: (!) 56  Resp: 16  SpO2: 95%  Weight: 140 lb (63.5 kg)  Height: '5\' 3"'$  (1.6 m)    Right Foot:Keratotic lesion on plantar surface 1.5 cm x 1.5cm. Pain with palpation. No drainage or erythema.    Right Knee: - Inspection: No gross deformity. No effusion. No erythema or bruising. Skin intact - Palpation: no TTP - ROM: full active ROM with flexion and extension in knee - Strength: 5/5 strength - Neuro/vasc: NV intact - Special Tests: - LIGAMENTS: negative anterior/posterior drawer, negative Lachman's, no MCL or LCL laxity  -- MENISCUS: negative McMurray's, negative Thessaly  -- PF JOINT: nml patellar mobility without apprehension. Negative patellar grind  Hips: normal, pain free passive ROM with IR/ER    Assessment & Plan:   Callus of foot Patient had a bamboo stick in her right foot almost 20 years ago and reports it was removed by her PCP. The area has become keratotic and painful. She has shaved it at home. No drainage or erythema. Patient is not a diabetic. Referral to podiatry.   Osteoarthritis of right knee Patient presents with knee pain involving the  right knee. Onset of the symptoms was several years ago. Inciting event: none known. Current symptoms include pain located deep in knee . Pain is aggravated by walking.  Patient has osteoarthritis of left knee on MRI. Pain responded well to knee injections.    Assessment/Plan: Osteoarthritis of right knee. Steroid Injection in Right knee today.   Procedure Note   Indication:  Right Knee Pain    Operators: Tamsen Snider, MD   The patient was provided with risks, benefits, and alternatives to intraarticular injection. He/She consented to intraarticular knee injection for right knee pain.  After a time out was preformed, the knee was prepped in a sterile fashion. Cold spray was applied to the skin over the insertion site (lateral inferior patellar aspect of the knee while in the seated position). A mixture of 1 cc 1% lidocaine and 40 mg of Kenalog was injected into the knee using a 23 gauge needle.  The right knee space was entered without difficulty, entered successfully after 1 attempt. The patient tolerated the procedures well without complication.     Lorene Dy, MD

## 2022-04-09 ENCOUNTER — Other Ambulatory Visit: Payer: Self-pay | Admitting: Family Medicine

## 2022-04-21 ENCOUNTER — Encounter: Payer: 59 | Admitting: Family Medicine

## 2022-05-01 ENCOUNTER — Other Ambulatory Visit: Payer: Self-pay | Admitting: Family Medicine

## 2022-05-04 ENCOUNTER — Other Ambulatory Visit: Payer: Self-pay | Admitting: Family Medicine

## 2022-05-08 ENCOUNTER — Other Ambulatory Visit: Payer: Self-pay | Admitting: Family Medicine

## 2022-06-10 ENCOUNTER — Encounter: Payer: 59 | Admitting: Family Medicine

## 2022-06-23 ENCOUNTER — Encounter: Payer: 59 | Admitting: Family Medicine

## 2022-08-20 ENCOUNTER — Ambulatory Visit (INDEPENDENT_AMBULATORY_CARE_PROVIDER_SITE_OTHER): Payer: 59 | Admitting: Family Medicine

## 2022-08-20 ENCOUNTER — Encounter: Payer: Self-pay | Admitting: Family Medicine

## 2022-08-20 ENCOUNTER — Ambulatory Visit (HOSPITAL_COMMUNITY)
Admission: RE | Admit: 2022-08-20 | Discharge: 2022-08-20 | Disposition: A | Discharge status: Home / Self Care | Payer: 59 | Source: Ambulatory Visit | Attending: Family Medicine | Admitting: Family Medicine

## 2022-08-20 VITALS — BP 161/74 | HR 50 | Ht 63.0 in | Wt 143.0 lb

## 2022-08-20 DIAGNOSIS — R140 Abdominal distension (gaseous): Secondary | ICD-10-CM | POA: Diagnosis not present

## 2022-08-20 DIAGNOSIS — Z0001 Encounter for general adult medical examination with abnormal findings: Secondary | ICD-10-CM

## 2022-08-20 DIAGNOSIS — I10 Essential (primary) hypertension: Secondary | ICD-10-CM | POA: Diagnosis not present

## 2022-08-20 DIAGNOSIS — Z23 Encounter for immunization: Secondary | ICD-10-CM | POA: Diagnosis not present

## 2022-08-20 DIAGNOSIS — K5641 Fecal impaction: Secondary | ICD-10-CM

## 2022-08-20 DIAGNOSIS — Z1231 Encounter for screening mammogram for malignant neoplasm of breast: Secondary | ICD-10-CM

## 2022-08-20 DIAGNOSIS — R1900 Intra-abdominal and pelvic swelling, mass and lump, unspecified site: Secondary | ICD-10-CM

## 2022-08-20 NOTE — Patient Instructions (Signed)
F/U in 2  to 3 weeks for re evaluation of blood pressure and chronic problems call if you need me sooner  X-ray of abdomen today.We will call with the result  Please schedule mammogram at checkout.  Fasting labs to be obtained today which were ordered since November 2023.  Please resume the hydrochlorothiazide you do need this for your blood pressure what you describe is vertigo and is not related to blood pressure medication.  I will have scan of your abdomen ordered to be done as soon as possible we will start with x-ray today.  Thanks for choosing [facility], we consider it a privelige to serve you.   PLS give TdAP if she agrees  Thanks for choosing [facility], we consider it a privelige to serve you.

## 2022-08-20 NOTE — Progress Notes (Unsigned)
Ct abd   Catherine Mclean     MRN: [id number]      DOB: 18-Jun-1960  Chief Complaint  Patient presents with   Annual Exam    CPE  bloating x 3 weeks stopped hydrochlorothiazide due to making her dizzy.    HPI: Patient is in for annual physical exam. No other health concerns are expressed or addressed at the visit. Recent labs,  are reviewed. Immunization is reviewed , and  updated if needed.   PE: Pleasant  female, alert and oriented x 3, in no cardio-pulmonary distress. Afebrile. HEENT No facial trauma or asymetry. Sinuses non tender.  Extra occullar muscles intact.. External ears normal, . Neck: supple, no adenopathy,JVD or thyromegaly.No bruits.  Chest: Clear to ascultation bilaterally.No crackles or wheezes. Non tender to palpation  Breast: No asymetry,no masses or lumps. No tenderness. No nipple discharge or inversion. No axillary or supraclavicular adenopathy  Cardiovascular system; Heart sounds normal,  S1 and  S2 ,no S3.  No murmur, or thrill. Apical beat not displaced Peripheral pulses normal.  Abdomen: Soft, non tender, no organomegaly or masses. No bruits. Bowel sounds normal. No guarding, tenderness or rebound.   GU: External genitalia normal female genitalia , normal female distribution of hair. No lesions. Urethral meatus normal in size, no  Prolapse, no lesions visibly  Present. Bladder non tender. Vagina pink and moist , with no visible lesions , discharge present . Adequate pelvic support no  cystocele or rectocele noted Cervix pink and appears healthy, no lesions or ulcerations noted, no discharge noted from os Uterus normal size, no adnexal masses, no cervical motion or adnexal tenderness.   Musculoskeletal exam: Full ROM of spine, hips , shoulders and knees. No deformity ,swelling or crepitus noted. No muscle wasting or atrophy.   Neurologic: Cranial nerves 2 to 12 intact. Power, tone ,sensation and reflexes normal throughout. No  disturbance in gait. No tremor.  Skin: Intact, no ulceration, erythema , scaling or rash noted. Pigmentation normal throughout  Psych; Normal mood and affect. Judgement and concentration normal   Assessment & Plan:  No problem-specific Assessment & Plan notes found for this encounter.

## 2022-08-21 DIAGNOSIS — Z0001 Encounter for general adult medical examination with abnormal findings: Secondary | ICD-10-CM | POA: Insufficient documentation

## 2022-08-21 DIAGNOSIS — Z23 Encounter for immunization: Secondary | ICD-10-CM | POA: Insufficient documentation

## 2022-08-21 DIAGNOSIS — R1900 Intra-abdominal and pelvic swelling, mass and lump, unspecified site: Secondary | ICD-10-CM | POA: Insufficient documentation

## 2022-08-21 DIAGNOSIS — R140 Abdominal distension (gaseous): Secondary | ICD-10-CM | POA: Insufficient documentation

## 2022-08-21 LAB — CMP14+EGFR
ALT: 11 IU/L (ref 0–32)
AST: 32 IU/L (ref 0–40)
Albumin: 4.6 g/dL (ref 3.9–4.9)
Alkaline Phosphatase: 93 IU/L (ref 44–121)
BUN/Creatinine Ratio: 9 — ABNORMAL LOW (ref 12–28)
BUN: 10 mg/dL (ref 8–27)
Bilirubin Total: <0.2 mg/dL (ref 0.0–1.2)
CO2: 21 mmol/L (ref 20–29)
Calcium: 10 mg/dL (ref 8.7–10.3)
Chloride: 101 mmol/L (ref 96–106)
Creatinine, Ser: 1.06 mg/dL — ABNORMAL HIGH (ref 0.57–1.00)
Globulin, Total: 3 g/dL (ref 1.5–4.5)
Glucose: 93 mg/dL (ref 70–99)
Potassium: 3.6 mmol/L (ref 3.5–5.2)
Sodium: 142 mmol/L (ref 134–144)
Total Protein: 7.6 g/dL (ref 6.0–8.5)
eGFR: 59 mL/min/{1.73_m2} — ABNORMAL LOW (ref >59)

## 2022-08-21 LAB — CBC WITH DIFFERENTIAL/PLATELET
Basophils Absolute: 0.1 10*3/uL (ref 0.0–0.2)
Basos: 1 %
EOS (ABSOLUTE): 0.1 10*3/uL (ref 0.0–0.4)
Eos: 2 %
Hematocrit: 41.5 % (ref 34.0–46.6)
Hemoglobin: 13.5 g/dL (ref 11.1–15.9)
Immature Grans (Abs): 0 10*3/uL (ref 0.0–0.1)
Immature Granulocytes: 0 %
Lymphocytes Absolute: 2.9 10*3/uL (ref 0.7–3.1)
Lymphs: 37 %
MCH: 26.9 pg (ref 26.6–33.0)
MCHC: 32.5 g/dL (ref 31.5–35.7)
MCV: 83 fL (ref 79–97)
Monocytes Absolute: 0.4 10*3/uL (ref 0.1–0.9)
Monocytes: 6 %
Neutrophils Absolute: 4.3 10*3/uL (ref 1.4–7.0)
Neutrophils: 54 %
Platelets: 263 10*3/uL (ref 150–450)
RBC: 5.01 x10E6/uL (ref 3.77–5.28)
RDW: 13.4 % (ref 11.7–15.4)
WBC: 7.8 10*3/uL (ref 3.4–10.8)

## 2022-08-21 LAB — LIPID PANEL
Chol/HDL Ratio: 2.3 ratio (ref 0.0–4.4)
Cholesterol, Total: 164 mg/dL (ref 100–199)
HDL: 72 mg/dL (ref >39)
LDL Chol Calc (NIH): 76 mg/dL (ref 0–99)
Triglycerides: 86 mg/dL (ref 0–149)
VLDL Cholesterol Cal: 16 mg/dL (ref 5–40)

## 2022-08-21 LAB — VITAMIN D 25 HYDROXY (VIT D DEFICIENCY, FRACTURES): Vit D, 25-Hydroxy: 21.8 ng/mL — ABNORMAL LOW (ref 30.0–100.0)

## 2022-08-21 LAB — TSH: TSH: 1.56 u[IU]/mL (ref 0.450–4.500)

## 2022-08-21 MED ORDER — VITAMIN D (ERGOCALCIFEROL) 1.25 MG (50000 UNIT) PO CAPS: 50000.0000 [IU] | ORAL_CAPSULE | ORAL | 5 refills | Status: DC

## 2022-08-21 NOTE — Assessment & Plan Note (Signed)
Annual exam as documented. . Immunization and cancer screening needs are specifically addressed at this visit.  

## 2022-08-21 NOTE — Assessment & Plan Note (Signed)
After obtaining informed consent, the vaccine is  administered , with no adverse effect noted at the time of administration.  

## 2022-08-21 NOTE — Assessment & Plan Note (Signed)
2 month history, possible hepatomegaly or pper abdominal mass, reent constipation Abdominal X ray and scan asap

## 2022-08-21 NOTE — Assessment & Plan Note (Signed)
Uncontrolled , needs to resume HCT, symptom she decribes is vertigo which is not relate to medication DASH diet and commitment to daily physical activity for a minimum of 30 minutes discussed and encouraged, as a part of hypertension management. The importance of attaining a healthy weight is also discussed.     08/20/2022   10:41 AM 08/20/2022   10:40 AM 01/05/2022    9:41 AM 12/30/2021    9:43 AM 12/30/2021    9:04 AM 12/30/2021    8:46 AM 11/04/2021    9:24 AM  BP/Weight  Systolic BP 161 180 121 124 124 128 150  Diastolic BP 74 78 72 80 80 73 80  Wt. (Lbs)  143 140 139  139   BMI  25.33 kg/m2 24.8 kg/m2 24.62 kg/m2  24.62 kg/m2

## 2022-08-21 NOTE — Addendum Note (Signed)
Addended by: Kerri Perches on: 08/21/2022 07:14 AM   Modules accepted: Orders

## 2022-08-24 ENCOUNTER — Ambulatory Visit: Payer: 59 | Admitting: Internal Medicine

## 2022-08-24 ENCOUNTER — Ambulatory Visit (INDEPENDENT_AMBULATORY_CARE_PROVIDER_SITE_OTHER): Payer: 59 | Admitting: Internal Medicine

## 2022-08-24 ENCOUNTER — Encounter: Payer: Self-pay | Admitting: Internal Medicine

## 2022-08-24 VITALS — BP 144/67 | HR 53 | Ht 63.0 in | Wt 143.0 lb

## 2022-08-24 DIAGNOSIS — M170 Bilateral primary osteoarthritis of knee: Secondary | ICD-10-CM | POA: Diagnosis not present

## 2022-08-24 DIAGNOSIS — N838 Other noninflammatory disorders of ovary, fallopian tube and broad ligament: Secondary | ICD-10-CM

## 2022-08-24 DIAGNOSIS — M1712 Unilateral primary osteoarthritis, left knee: Secondary | ICD-10-CM | POA: Insufficient documentation

## 2022-08-24 MED ORDER — BUPIVACAINE HCL (PF) 0.25 % IJ SOLN
4.0000 mL | Freq: Once | INTRAMUSCULAR | Status: AC
Start: 2022-08-24 — End: 2022-08-24
  Administered 2022-08-24: 4 mL

## 2022-08-24 MED ORDER — TRIAMCINOLONE ACETONIDE 40 MG/ML IJ SUSP
40.0000 mg | Freq: Once | INTRAMUSCULAR | Status: AC
Start: 2022-08-24 — End: 2022-08-24
  Administered 2022-08-24: 40 mg via INTRAMUSCULAR

## 2022-08-24 MED ORDER — LIDOCAINE HCL 1 % IJ SOLN
2.0000 mL | Freq: Once | INTRAMUSCULAR | Status: AC
Start: 2022-08-24 — End: 2022-08-24
  Administered 2022-08-24: 2 mL via INTRADERMAL

## 2022-08-24 NOTE — Assessment & Plan Note (Deleted)
MRI in 2019 negative for meniscal or ligament tear and very mild patellofemoral degenerative disease. Patient received injection 12/2021 and has found good pain relief. Phas returned and here for steroid injection.

## 2022-08-24 NOTE — Progress Notes (Signed)
   HPI:[name] is a 62 y.o. female who presents for evaluation of bilateral knee pain. For the details of today's visit, please refer to the assessment and plan.  Physical Exam: Vitals:   08/24/22 1433  BP: (!) 144/67  Pulse: (!) 53  SpO2: 96%  Weight: 143 lb 0.6 oz (64.9 kg)  Height: 5\' 3"  (1.6 m)     Physical Exam Constitutional:      General: She is not in acute distress.    Appearance: She is not ill-appearing.  Musculoskeletal:     Right knee: No swelling, deformity, effusion or erythema. Normal range of motion. No LCL laxity, MCL laxity, ACL laxity or PCL laxity.     Left knee: No swelling, deformity, effusion or erythema. Normal range of motion. No LCL laxity, MCL laxity, ACL laxity or PCL laxity.     Assessment & Plan:   Catherine Mclean was seen today for knee pain.  Bilateral primary osteoarthritis of knee Assessment & Plan: Last injection was 12/2021 , both right and left knee injected in November on separate appointments.  Patient has had good pain relief for almost 7 months. Over the last month pain has gradually increased. No acute changes , trauma, or instability of knee since last visit.    Chronic problem, uncontrolled Corticosteroid injection today under ultrasound guidance. Patient given knee exercises and advice on OTC medications she can use to manage this chronic condition. Discussed ultimately some patients will need knee replacement.  I will be moving and patient given recommendation to follow up with Dr.Simpson for referral to Sports Medicine Office on [facility] if she needs additional injections.  Aspiration/Injection Procedure Note Catherine Mclean 1960-09-19  Procedure: Injection Indications: right knee osteoarthitis Risks of procedure as well as the alternatives and risks of each were explained to the patient. Consent for procedure obtained.    After a time out was preformed, the knee was prepped in a sterile fashion. Cold spray was applied  to the skin over the insertion site. The right knee superior lateral suprapatellar pouch was injected using 2 cc of 1% lidocaine on a 23-gauge 1-1/2 inch needle.  The syringe was switched to mixture containing 1 cc's of 40 mg Kenalog and 4 cc's of .25% Bupivacaine was injected.  Ultrasound was used. A sterile dressing was applied. The patient tolerated the procedures well without complication.    Aspiration/Injection Procedure Note Catherine Mclean [birth year]  Procedure: Injection Indications: left knee osteoarthitis Risks of procedure as well as the alternatives and risks of each were explained to the patient. Consent for procedure obtained.    After a time out was preformed, the knee was prepped in a sterile fashion. Cold spray was applied to the skin over the insertion site. The left knee superior lateral suprapatellar pouch was injected using 2 cc of 1% lidocaine on a 23-gauge 1-1/2 inch needle.  The syringe was switched to mixture containing 1 cc's of 40 mg Kenalog and 4 cc's of .25% Bupivacaine was injected.  Ultrasound was used. A sterile dressing was applied. The patient tolerated the procedures well without complication.    Orders: -     Lidocaine HCl -     Lidocaine HCl -     BUPivacaine HCl (PF) -     BUPivacaine HCl (PF) -     Triamcinolone Acetonide -     Triamcinolone Acetonide      Milus Banister, MD

## 2022-08-24 NOTE — Assessment & Plan Note (Signed)
Last injection was 12/2021 , both right and left knee injected in November on separate appointments.  Patient has had good pain relief for almost 7 months. Over the last month pain has gradually increased. No acute changes , trauma, or instability of knee since last visit.    Chronic problem, uncontrolled Corticosteroid injection today under ultrasound guidance. Patient given knee exercises and advice on OTC medications she can use to manage this chronic condition. Discussed ultimately some patients will need knee replacement.  I will be moving and patient given recommendation to follow up with Dr.Simpson for referral to Sports Medicine Office on [facility] if she needs additional injections.  Aspiration/Injection Procedure Note Biannca Shiraishi Catherman August 08, 1960  Procedure: Injection Indications: right knee osteoarthitis Risks of procedure as well as the alternatives and risks of each were explained to the patient. Consent for procedure obtained.    After a time out was preformed, the knee was prepped in a sterile fashion. Cold spray was applied to the skin over the insertion site. The right knee superior lateral suprapatellar pouch was injected using 2 cc of 1% lidocaine on a 23-gauge 1-1/2 inch needle.  The syringe was switched to mixture containing 1 cc's of 40 mg Kenalog and 4 cc's of .25% Bupivacaine was injected.  Ultrasound was used. A sterile dressing was applied. The patient tolerated the procedures well without complication.    Aspiration/Injection Procedure Note Kairo Earhart Feltes [birth year]  Procedure: Injection Indications: left knee osteoarthitis Risks of procedure as well as the alternatives and risks of each were explained to the patient. Consent for procedure obtained.    After a time out was preformed, the knee was prepped in a sterile fashion. Cold spray was applied to the skin over the insertion site. The left knee superior lateral suprapatellar pouch was injected using 2 cc of 1%  lidocaine on a 23-gauge 1-1/2 inch needle.  The syringe was switched to mixture containing 1 cc's of 40 mg Kenalog and 4 cc's of .25% Bupivacaine was injected.  Ultrasound was used. A sterile dressing was applied. The patient tolerated the procedures well without complication.

## 2022-08-24 NOTE — Assessment & Plan Note (Deleted)
Last injection was 01/05/2022.  Patient has had good pain relief for almost 7 months. Over the last month pain has gradually increased. No acute changes , trauma, or instability of knee since last visit.   Chronic problem, uncontrolled Corticosteroid injection today under ultrasound guidance. Patient given knee exercises and advice on OTC medications she can use to manage this chronic condition. Discussed ultimately some patients will need knee replacement.  I will be moving and patient given recommendation to follow up with Dr.Simpson for referral to Sports Medicine Office on [facility] if she needs additional injections.  Aspiration/Injection Procedure Note Catherine Mclean 1960-03-04  Procedure: Injection Indications: right knee osteoarthitis Risks of procedure as well as the alternatives and risks of each were explained to the patient. Consent for procedure obtained.    After a time out was preformed, the knee was prepped in a sterile fashion. Cold spray was applied to the skin over the insertion site. The left knee superior lateral suprapatellar pouch was injected using 2 cc of 1% lidocaine on a 23-gauge 1-1/2 inch needle.  The syringe was switched to mixture containing 1 cc's of 40 mg Kenalog and 4 cc's of .25% Bupivacaine was injected.  Ultrasound was used. A sterile dressing was applied. The patient tolerated the procedures well without complication.

## 2022-08-24 NOTE — Patient Instructions (Signed)
Thank you, Ms.Parilee M Coachman for allowing [place] to provide your care today.   Your pain is due to arthritis. You received cortisone injections today.  There are other types of injection the Sports Medicine Office in [place] can offer ( [facility], [facility] on [facility])  These are the different medications you can take for arthritis: Tylenol 500mg  1-2 tabs three times a day for pain. Capsaicin, aspercreme, or biofreeze topically up to four times a day may also help with pain.  Exercises: Straight leg raises, knee extensions 3 sets of 10 once a day (add ankle weight if these become too easy). Consider physical therapy to strengthen muscles around the joint that hurts to take pressure off of the joint itself. Shoe inserts with good arch support may be helpful. Heat or ice 15 minutes at a time 3-4 times a day as needed to help with pain. Water aerobics and cycling with low resistance are the best two types of exercise for arthritis though any exercise is ok as long as it doesn't worsen the pain.    Reminders: Follow up as needed. Thank you for trusting me with your care while I was in [place]. My best!    Thurmon Fair, M.D.

## 2022-09-03 ENCOUNTER — Encounter (HOSPITAL_COMMUNITY): Payer: Self-pay

## 2022-09-03 ENCOUNTER — Ambulatory Visit: Payer: 59 | Admitting: Family Medicine

## 2022-09-03 ENCOUNTER — Ambulatory Visit (HOSPITAL_COMMUNITY)
Admission: RE | Admit: 2022-09-03 | Discharge: 2022-09-03 | Disposition: A | Discharge status: Home / Self Care | Payer: 59 | Source: Ambulatory Visit | Attending: Family Medicine | Admitting: Family Medicine

## 2022-09-03 ENCOUNTER — Telehealth: Payer: Self-pay | Admitting: *Deleted

## 2022-09-03 ENCOUNTER — Encounter: Payer: Self-pay | Admitting: Family Medicine

## 2022-09-03 DIAGNOSIS — K5641 Fecal impaction: Secondary | ICD-10-CM | POA: Diagnosis not present

## 2022-09-03 MED ORDER — IOHEXOL 300 MG/ML  SOLN
150.0000 mL | Freq: Once | INTRAMUSCULAR | Status: AC | PRN
  Administered 2022-09-03: 75 mL via INTRAVENOUS

## 2022-09-03 MED ORDER — ALPRAZOLAM 0.25 MG PO TABS: 0.2500 mg | ORAL_TABLET | Freq: Every evening | ORAL | 0 refills | Status: DC | PRN

## 2022-09-03 NOTE — Telephone Encounter (Signed)
Spoke with the patient regarding the referral to GYN oncology. Patient scheduled as new patient with Dr Alvester Morin on 7/15 at 10:30 am.  Patient given an arrival time of 10 am.  Explained to the patient the the doctor will perform a pelvic exam at this visit. Patient given the policy that only one visitor allowed and that visitor must be over 16 yrs are allowed in the Cancer Center. Patient given the address/phone number for the clinic and that the center offers free valet service. Patient aware that masks are option.

## 2022-09-03 NOTE — Addendum Note (Signed)
Addended by: Syliva Overman E on: 09/03/2022 12:04 PM   Modules accepted: Orders

## 2022-09-03 NOTE — Addendum Note (Signed)
Addended by: Kerri Perches on: 09/03/2022 09:34 AM   Modules accepted: Orders

## 2022-09-06 ENCOUNTER — Encounter: Payer: Self-pay | Admitting: Psychiatry

## 2022-09-06 ENCOUNTER — Inpatient Hospital Stay (HOSPITAL_BASED_OUTPATIENT_CLINIC_OR_DEPARTMENT_OTHER): Payer: 59 | Admitting: Gynecologic Oncology

## 2022-09-06 ENCOUNTER — Inpatient Hospital Stay: Payer: 59

## 2022-09-06 ENCOUNTER — Inpatient Hospital Stay: Payer: 59 | Attending: Psychiatry | Admitting: Psychiatry

## 2022-09-06 ENCOUNTER — Other Ambulatory Visit: Payer: Self-pay

## 2022-09-06 VITALS — BP 140/82 | HR 50 | Temp 98.3°F | Resp 17 | Wt 143.0 lb

## 2022-09-06 DIAGNOSIS — N9489 Other specified conditions associated with female genital organs and menstrual cycle: Secondary | ICD-10-CM

## 2022-09-06 DIAGNOSIS — D3911 Neoplasm of uncertain behavior of right ovary: Secondary | ICD-10-CM | POA: Diagnosis present

## 2022-09-06 DIAGNOSIS — Z809 Family history of malignant neoplasm, unspecified: Secondary | ICD-10-CM | POA: Diagnosis not present

## 2022-09-06 DIAGNOSIS — N950 Postmenopausal bleeding: Secondary | ICD-10-CM

## 2022-09-06 DIAGNOSIS — F419 Anxiety disorder, unspecified: Secondary | ICD-10-CM | POA: Diagnosis not present

## 2022-09-06 DIAGNOSIS — E785 Hyperlipidemia, unspecified: Secondary | ICD-10-CM | POA: Insufficient documentation

## 2022-09-06 DIAGNOSIS — Z87891 Personal history of nicotine dependence: Secondary | ICD-10-CM | POA: Diagnosis not present

## 2022-09-06 DIAGNOSIS — Z79899 Other long term (current) drug therapy: Secondary | ICD-10-CM | POA: Diagnosis not present

## 2022-09-06 DIAGNOSIS — R001 Bradycardia, unspecified: Secondary | ICD-10-CM | POA: Insufficient documentation

## 2022-09-06 DIAGNOSIS — N393 Stress incontinence (female) (male): Secondary | ICD-10-CM | POA: Diagnosis not present

## 2022-09-06 DIAGNOSIS — F32A Depression, unspecified: Secondary | ICD-10-CM | POA: Diagnosis not present

## 2022-09-06 DIAGNOSIS — I10 Essential (primary) hypertension: Secondary | ICD-10-CM | POA: Insufficient documentation

## 2022-09-06 LAB — CEA (ACCESS): CEA (CHCC): 2.24 ng/mL (ref 0.00–5.00)

## 2022-09-06 MED ORDER — TRAMADOL HCL 50 MG PO TABS
50.0000 mg | ORAL_TABLET | Freq: Four times a day (QID) | ORAL | 0 refills | Status: DC | PRN
Start: 2022-09-06 — End: 2022-10-01

## 2022-09-06 MED ORDER — SENNOSIDES-DOCUSATE SODIUM 8.6-50 MG PO TABS
2.0000 | ORAL_TABLET | Freq: Every day | ORAL | 0 refills | Status: DC
Start: 2022-09-06 — End: 2023-01-14

## 2022-09-06 NOTE — Patient Instructions (Signed)
Today you had an endometrial biopsy which is a sample from the lining of the uterus. You may have spotting from this or see a grayish discharge from the medication used to stop bleeding.  Preparing for your Surgery  Plan for surgery on September 28, 2022 with Dr. Clide Cliff at [facility]. You will be scheduled for robotic assisted total laparoscopic hysterectomy (removal of the uterus and cervix), bilateral salpingo-oophorectomy (removal of both ovaries and fallopian tubes), possible staging, mini laparotomy for controlled cyst drainage, possible laparotomy (larger incision on your abdomen if needed).   Pre-operative Testing -You will receive a phone call from presurgical testing at [facility] to arrange for a pre-operative appointment and lab work.  -Bring your insurance card, copy of an advanced directive if applicable, medication list  -At that visit, you will be asked to sign a consent for a possible blood transfusion in case a transfusion becomes necessary during surgery.  The need for a blood transfusion is rare but having consent is a necessary part of your care.     -You should not be taking blood thinners or aspirin at least ten days prior to surgery unless instructed by your surgeon.  -Do not take supplements such as fish oil (omega 3), red yeast rice, turmeric before your surgery. You want to avoid medications with aspirin in them including headache powders such as BC or Goody's), Excedrin migraine.  Day Before Surgery at Home -You will be asked to take in a light diet the day before surgery. You will be advised you can have clear liquids up until 3 hours before your surgery.    Eat a light diet the day before surgery.  Examples including soups, broths, toast, yogurt, mashed potatoes.  AVOID GAS PRODUCING FOODS AND BEVERAGES. Things to avoid include carbonated beverages (fizzy beverages, sodas), raw fruits and raw vegetables (uncooked), or beans.   If your bowels  are filled with gas, your surgeon will have difficulty visualizing your pelvic organs which increases your surgical risks.  Your role in recovery Your role is to become active as soon as directed by your doctor, while still giving yourself time to heal.  Rest when you feel tired. You will be asked to do the following in order to speed your recovery:  - Cough and breathe deeply. This helps to clear and expand your lungs and can prevent pneumonia after surgery.  - STAY ACTIVE WHEN YOU GET HOME. Do mild physical activity. Walking or moving your legs help your circulation and body functions return to normal. Do not try to get up or walk alone the first time after surgery.   -If you develop swelling on one leg or the other, pain in the back of your leg, redness/warmth in one of your legs, please call the office or go to the Emergency Room to have a doppler to rule out a blood clot. For shortness of breath, chest pain-seek care in the Emergency Room as soon as possible. - Actively manage your pain. Managing your pain lets you move in comfort. We will ask you to rate your pain on a scale of zero to 10. It is your responsibility to tell your doctor or nurse where and how much you hurt so your pain can be treated.  Special Considerations -If you are diabetic, you may be placed on insulin after surgery to have closer control over your blood sugars to promote healing and recovery.  This does not mean that you will be discharged  on insulin.  If applicable, your oral antidiabetics will be resumed when you are tolerating a solid diet.  -Your final pathology results from surgery should be available around one week after surgery and the results will be relayed to you when available.  -Dr. Antionette Char is the surgeon that assists your GYN Oncologist with surgery.  If you end up staying the night, the next day after your surgery you will either see Dr. Pricilla Holm, Dr. Alvester Morin, or Dr. Antionette Char.  -FMLA forms  can be faxed to [phone] and please allow 5-7 business days for completion.  Pain Management After Surgery -You have been prescribed your pain medication and bowel regimen medications before surgery so that you can have these available when you are discharged from the hospital. The pain medication is for use ONLY AFTER surgery and a new prescription will not be given.   -Make sure that you have Tylenol and Ibuprofen IF YOU ARE ABLE TO TAKE THESE MEDICATIONS at home to use on a regular basis after surgery for pain control. We recommend alternating the medications every hour to six hours since they work differently and are processed in the body differently for pain relief.  -Review the attached handout on narcotic use and their risks and side effects.   Bowel Regimen -You have been prescribed Sennakot-S to take nightly to prevent constipation especially if you are taking the narcotic pain medication intermittently.  It is important to prevent constipation and drink adequate amounts of liquids. You can stop taking this medication when you are not taking pain medication and you are back on your normal bowel routine.  Risks of Surgery Risks of surgery are low but include bleeding, infection, damage to surrounding structures, re-operation, blood clots, and very rarely death.   Blood Transfusion Information (For the consent to be signed before surgery)  We will be checking your blood type before surgery so in case of emergencies, we will know what type of blood you would need.                                            WHAT IS A BLOOD TRANSFUSION?  A transfusion is the replacement of blood or some of its parts. Blood is made up of multiple cells which provide different functions. Red blood cells carry oxygen and are used for blood loss replacement. White blood cells fight against infection. Platelets control bleeding. Plasma helps clot blood. Other blood products are available for specialized  needs, such as hemophilia or other clotting disorders. BEFORE THE TRANSFUSION  Who gives blood for transfusions?  You may be able to donate blood to be used at a later date on yourself (autologous donation). Relatives can be asked to donate blood. This is generally not any safer than if you have received blood from a stranger. The same precautions are taken to ensure safety when a relative's blood is donated. Healthy volunteers who are fully evaluated to make sure their blood is safe. This is blood bank blood. Transfusion therapy is the safest it has ever been in the practice of medicine. Before blood is taken from a donor, a complete history is taken to make sure that person has no history of diseases nor engages in risky social behavior (examples are intravenous drug use or sexual activity with multiple partners). The donor's travel history is screened to minimize risk of transmitting infections,  such as malaria. The donated blood is tested for signs of infectious diseases, such as HIV and hepatitis. The blood is then tested to be sure it is compatible with you in order to minimize the chance of a transfusion reaction. If you or a relative donates blood, this is often done in anticipation of surgery and is not appropriate for emergency situations. It takes many days to process the donated blood. RISKS AND COMPLICATIONS Although transfusion therapy is very safe and saves many lives, the main dangers of transfusion include:  Getting an infectious disease. Developing a transfusion reaction. This is an allergic reaction to something in the blood you were given. Every precaution is taken to prevent this. The decision to have a blood transfusion has been considered carefully by your caregiver before blood is given. Blood is not given unless the benefits outweigh the risks.  AFTER SURGERY INSTRUCTIONS  Return to work: 4-6 weeks if applicable  Activity: 1. Be up and out of the bed during the day.  Take a  nap if needed.  You may walk up steps but be careful and use the hand rail.  Stair climbing will tire you more than you think, you may need to stop part way and rest.   2. No lifting or straining for 6 weeks over 10 pounds. No pushing, pulling, straining for 6 weeks.  3. No driving for around 1 week(s).  Do not drive if you are taking narcotic pain medicine and make sure that your reaction time has returned.   4. You can shower as soon as the next day after surgery. Shower daily.  Use your regular soap and water (not directly on the incision) and pat your incision(s) dry afterwards; don't rub.  No tub baths or submerging your body in water until cleared by your surgeon. If you have the soap that was given to you by pre-surgical testing that was used before surgery, you do not need to use it afterwards because this can irritate your incisions.   5. No sexual activity and nothing in the vagina for 12 weeks.  6. You may experience a small amount of clear drainage from your incisions, which is normal.  If the drainage persists, increases, or changes color please call the office.  7. Do not use creams, lotions, or ointments such as neosporin on your incisions after surgery until advised by your surgeon because they can cause removal of the dermabond glue on your incisions.    8. You may experience vaginal spotting after surgery or when the stitches at the top of the vagina begin to dissolve.  The spotting is normal but if you experience heavy bleeding, call our office.  9. Take Tylenol or ibuprofen first for pain if you are able to take these medications and only use narcotic pain medication for severe pain not relieved by the Tylenol or Ibuprofen.  Monitor your Tylenol intake to a max of 4,000 mg in a 24 hour period. You can alternate these medications after surgery.  Diet: 1. Low sodium Heart Healthy Diet is recommended but you are cleared to resume your normal (before surgery) diet after your  procedure.  2. It is safe to use a laxative, such as Miralax or Colace, if you have difficulty moving your bowels. You have been prescribed Sennakot-S to take at bedtime every evening after surgery to keep bowel movements regular and to prevent constipation.    Wound Care: 1. Keep clean and dry.  Shower daily.  Reasons to  call the Doctor: Fever - Oral temperature greater than 100.4 degrees Fahrenheit Foul-smelling vaginal discharge Difficulty urinating Nausea and vomiting Increased pain at the site of the incision that is unrelieved with pain medicine. Difficulty breathing with or without chest pain New calf pain especially if only on one side Sudden, continuing increased vaginal bleeding with or without clots.   Contacts: For questions or concerns you should contact:  Dr. Clide Cliff at [phone]  Warner Mccreedy, NP at [phone]  After Hours: call [phone] and have the GYN Oncologist paged/contacted (after 5 pm or on the weekends). You will speak with an after hours RN and let he or she know you have had surgery.  Messages sent via mychart are for non-urgent matters and are not responded to after hours so for urgent needs, please call the after hours number.

## 2022-09-06 NOTE — Progress Notes (Unsigned)
GYNECOLOGIC ONCOLOGY NEW PATIENT CONSULTATION  Date of Service: 09/06/2022 Referring Provider: Kerri Perches, MD [address] [place],  [place] [address]   ASSESSMENT AND PLAN: Catherine Mclean is a 62 y.o. woman with a 22.6 cm complex mass from the right adnexa.  We reviewed that the exact etiology of the pelvic mass is unclear, but could include a benign, borderline, or malignant process.  The recommended treatment is surgical excision to make a definitive diagnosis.  Recommend obtaining tumor markers today.  Also recommend CT chest to complete imaging to rule out a metastatic process.  We reviewed the goal of removing the mass without leakage of fluid in case of a malignant process.  This would require at least a mini laparotomy for control drainage versus laparotomy.  Given her age, would recommend bilateral salpingo-oophorectomy at a minimum.  Given presumably postmenopausal bleeding given ongoing bleeding at her age, recommended endometrial biopsy today.  See procedure note below.  Hysterectomy may be recommended pending pathology from endometrial biopsy.  Otherwise, in the event of malignancy or borderline tumor on frozen section, we will perform indicated staging procedures. We discussed that these procedures may include hysterectomy, omentectomy pelvic and/or para-aortic lymphadenectomy, peritoneal biopsies. We would also remove any tissue concerning for metastatic disease which could require additional procedures including bowel surgery.  Patient was consented for: Mini laparotomy and controlled cyst drainage, robotic assisted bilateral salpingo-oophorectomy, possible hysterectomy, possible staging on 09/28/22.  The risks of surgery were discussed in detail and she understands these to including but not limited to bleeding requiring a blood transfusion, infection, injury to adjacent organs (including but not limited to the bowels, bladder, ureters, nerves, blood vessels),  thromboembolic events, wound separation, hernia, vaginal cuff separation, possible risk of lymphedema and lymphocyst if lymphadenectomy performed, unforseen complication, and possible need for re-exploration.  If the patient experiences any of these events, she understands that her hospitalization or recovery may be prolonged and that she may need to take additional medications for a prolonged period. The patient will receive DVT and antibiotic prophylaxis as indicated. She voiced a clear understanding. She had the opportunity to ask questions and informed consent was obtained today. She wishes to proceed.  She does not require preoperative clearance. Her METs are >4.  All preoperative instructions were reviewed. Postoperative expectations were also reviewed. Written handouts were provided to the patient.   A copy of this note was sent to the patient's referring provider.  Clide Cliff, MD Gynecologic Oncology   Medical Decision Making I personally spent  TOTAL 50 minutes face-to-face and non-face-to-face in the care of this patient, which includes all pre, intra, and post visit time on the date of service.    ------------  CC: Abdomino-pelvic mass  HISTORY OF PRESENT ILLNESS:  Catherine Mclean is a 62 y.o. woman who is seen in consultation at the request of Kerri Perches, MD for evaluation of abdomino-pelvic mass.  Patient was seen by her PCP on 08/20/2022 for her annual exam.  At that time she noted a 23-month history of abdominal distention and change in her bowel movements.  A CT abdomen/pelvis was performed on 09/03/2022 which noted a 22.6 cm complex cystic mass arising from the right adnexa.  She was also noted to have moderate volume stool throughout the colon.  Today, patient presents with her sister.  She denies any pain.  She notes some stress incontinence with laughing in the past 3 months.  She otherwise notes that she  is never gone 12 months without bleeding.  The  longest time she is gone is 6 months.  Bleeding is overall light.  She otherwise denies early satiety, significant weight loss.    PAST MEDICAL HISTORY: Past Medical History:  Diagnosis Date   Anxiety    Bradycardia    Depression    Hyperlipidemia    Hypertension     PAST SURGICAL HISTORY: Past Surgical History:  Procedure Laterality Date   BIOPSY  04/12/2018   Procedure: BIOPSY;  Surgeon: Malissa Hippo, MD;  [facility]: [facility];  Service: Endoscopy;;  appendicial orffice mucosa   COLONOSCOPY N/A 04/12/2018   Procedure: COLONOSCOPY;  Surgeon: Malissa Hippo, MD;  Location: [facility];  Service: Endoscopy;  Laterality: N/A;  930   OVARIAN CYST SURGERY     In her 20's, cystectomy?   POLYPECTOMY  04/12/2018   Procedure: POLYPECTOMY;  Surgeon: Malissa Hippo, MD;  Location: [facility];  Service: Endoscopy;;  splenic flexure    OB/GYN HISTORY: OB History  Gravida Para Term Preterm AB Living  1 1 1     1   SAB IAB Ectopic Multiple Live Births          1    # Outcome Date GA Lbr Len/2nd Weight Sex Type Anes PTL Lv  1 Term      Vag-Spont   LIV      Age at menarche: 42 Age at menopause: unknown, ongoing bleeding Hx of HRT: none Hx of STI: no Last pap: NILM, HPV - 04/17/2021 History of abnormal pap smears: no  SCREENING STUDIES:  Last mammogram: 06/2021 Last colonoscopy: 2020  MEDICATIONS:  Current Outpatient Medications:    ALPRAZolam (XANAX) 0.25 MG tablet, Take 1 tablet (0.25 mg total) by mouth at bedtime as needed for anxiety., Disp: 20 tablet, Rfl: 0   hydrochlorothiazide (HYDRODIURIL) 25 MG tablet, TAKE 1 TABLET(25 MG) BY MOUTH DAILY, Disp: 90 tablet, Rfl: 3   MYRBETRIQ 50 MG TB24 tablet, TAKE 1 TABLET(50 MG) BY MOUTH DAILY, Disp: 30 tablet, Rfl: 3   olmesartan (BENICAR) 20 MG tablet, Take 1 tablet (20 mg total) by mouth daily., Disp: 90 tablet, Rfl: 3   rosuvastatin (CRESTOR) 5 MG tablet, TAKE 1 TABLET(5 MG) BY MOUTH DAILY, Disp: 30 tablet, Rfl: 5    tiZANidine (ZANAFLEX) 4 MG tablet, TAKE 1 TABLET BY MOUTH TWICE DAILY, Disp: 60 tablet, Rfl: 5   Vitamin D, Ergocalciferol, (DRISDOL) 1.25 MG (50000 UNIT) CAPS capsule, Take 1 capsule (50,000 Units total) by mouth every 7 (seven) days., Disp: 5 capsule, Rfl: 5  ALLERGIES: Allergies  Allergen Reactions   Hydrochlorothiazide W-Triamterene     Muscle cramps   Spironolactone Other (See Comments)    Severe cramps    FAMILY HISTORY: Family History  Problem Relation Age of Onset   Hypertension Sister    Hypertension Sister    Hyperlipidemia Sister    Ulcerative colitis Sister    Arthritis/Rheumatoid Sister    Cancer Sister        unkonwn cancer   Breast cancer Neg Hx    Ovarian cancer Neg Hx    Endometrial cancer Neg Hx     SOCIAL HISTORY: Social History   Socioeconomic History   Marital status: Divorced    Spouse name: Not on file   Number of children: Not on file   Years of education: Not on file   Highest education level: 12th grade  Occupational History   Not on file  Tobacco  Use   Smoking status: Former    Current packs/day: 0.00    Types: Cigarettes    Quit date: 12/19/2016    Years since quitting: 5.7   Smokeless tobacco: Never   Tobacco comments:    quit after her last visit   Vaping Use   Vaping status: Former  Substance and Sexual Activity   Alcohol use: Yes    Comment: occ   Drug use: No   Sexual activity: Not Currently  Other Topics Concern   Not on file  Social History Narrative   Not on file   Social Determinants of Health   Financial Resource Strain: Low Risk  (08/23/2022)   Overall Financial Resource Strain (CARDIA)    Difficulty of Paying Living Expenses: Not hard at all  Food Insecurity: No Food Insecurity (08/23/2022)   Hunger Vital Sign    Worried About Running Out of Food in the Last Year: Never true    Ran Out of Food in the Last Year: Never true  Transportation Needs: No Transportation Needs (08/23/2022)   PRAPARE - Therapist, art (Medical): No    Lack of Transportation (Non-Medical): No  Physical Activity: Insufficiently Active (08/23/2022)   Exercise Vital Sign    Days of Exercise per Week: 3 days    Minutes of Exercise per Session: 30 min  Stress: Stress Concern Present (08/23/2022)   [employer] of Occupational Health - Occupational Stress Questionnaire    Feeling of Stress : To some extent  Social Connections: Moderately Integrated (08/23/2022)   Social Connection and Isolation Panel [NHANES]    Frequency of Communication with Friends and Family: More than three times a week    Frequency of Social Gatherings with Friends and Family: Once a week    Attends Religious Services: More than 4 times per year    Active Member of [employer] or Organizations: Yes    Attends Banker Meetings: 1 to 4 times per year    Marital Status: Divorced  Catering manager Violence: Not on file    REVIEW OF SYSTEMS: New patient intake form was reviewed.  Complete 10-system review is negative except for the following: urinary frequency  PHYSICAL EXAM: BP (!) 140/82 (BP Location: Right Arm, Patient Position: Sitting) Comment: manual cuff  Pulse (!) 50   Temp 98.3 F (36.8 C) (Oral)   Resp 17   Wt 143 lb (64.9 kg)   SpO2 99%   BMI 25.33 kg/m  Constitutional: No acute distress. Neuro/Psych: Alert, oriented.  Head and Neck: Normocephalic, atraumatic. Neck symmetric without masses. Sclera anicteric.  Respiratory: Normal work of breathing. Clear to auscultation bilaterally. Cardiovascular: Regular rate and rhythm, no murmurs, rubs, or gallops. Abdomen: Normoactive bowel sounds. Soft, non-distended, non-tender to palpation. Abdominopelvic mass fill lower abdomen and rising to above the level of the umbilicus. No evidence of hernia. Well healed pfannenstiel incision. Extremities: Grossly normal range of motion. Warm, well perfused. No edema bilaterally. Skin: No rashes or lesions. Lymphatic: No  cervical, supraclavicular, or inguinal adenopathy. Genitourinary: External genitalia without lesions. Urethral meatus without lesions or prolapse. On speculum exam, vagina and cervix without lesions. Bimanual exam reveals normal cervix, cervix deviate to the right, uterus deviated to the left but mobile. Large abdomino pelvic mass filling pelvis, somewhat mobile, likely arising from right adnexa. Exam chaperoned by Warner Mccreedy, NP   EMB Procedure: After appropriate verbal informed consent was obtained, a timeout was performed. A sterile speculum was placed in the vagina,  and the area was cleaned with betadine x3. A single-tooth tenaculum was placed on the anterior lip of the cervix. The os finder was used to dilate the cervix. An endometrial biopsy pipelle was advanced carefully to the uterine fundus which sounded to 8cm. An adequate sample was obtained over 2 passes with some polypoid tissue removd. The tenaculum was removed, and tenaculum sites were noted to be hemostatic. The speculum was removed from the vagina. The patient tolerated the procedure well.   UPT: not indicated   LABORATORY AND RADIOLOGIC DATA: Outside medical records were reviewed to synthesize the above history, along with the history and physical obtained during the visit.  Outside laboratory, and imaging reports were reviewed, with pertinent results below.  I personally reviewed the outside images.  WBC  Date Value Ref Range Status  08/20/2022 7.8 3.4 - 10.8 x10E3/uL Final  10/16/2018 9.3 3.8 - 10.8 Thousand/uL Final   Hemoglobin  Date Value Ref Range Status  08/20/2022 13.5 11.1 - 15.9 g/dL Final   Hematocrit  Date Value Ref Range Status  08/20/2022 41.5 34.0 - 46.6 % Final   Platelets  Date Value Ref Range Status  08/20/2022 263 150 - 450 x10E3/uL Final   Magnesium  Date Value Ref Range Status  02/04/2016 2.1 1.7 - 2.4 mg/dL Final   Creat  Date Value Ref Range Status  04/20/2019 0.85 0.50 - 0.99 mg/dL  Final    Comment:    For patients >67 years of age, the reference limit for Creatinine is approximately 13% higher for people identified as African-American. .    Creatinine, Ser  Date Value Ref Range Status  08/20/2022 1.06 (H) 0.57 - 1.00 mg/dL Final   AST  Date Value Ref Range Status  08/20/2022 32 0 - 40 IU/L Final   ALT  Date Value Ref Range Status  08/20/2022 11 0 - 32 IU/L Final   Diagnosis  Date Value Ref Range Status  04/17/2021   Final   - Negative for intraepithelial lesion or malignancy (NILM)  09/26/2017   Final   NEGATIVE FOR INTRAEPITHELIAL LESIONS OR MALIGNANCY.   HPV  Date Value Ref Range Status  09/26/2017 NOT DETECTED  Final    Comment:    Normal Reference Range - NOT Detected    CT Abdomen Pelvis W Contrast 09/03/2022  Narrative CLINICAL DATA:  One-month history of bloating decreased bowel movements. Reported history of ovarian surgery. * Tracking Code: BO *  EXAM: CT ABDOMEN AND PELVIS WITH CONTRAST  TECHNIQUE: Multidetector CT imaging of the abdomen and pelvis was performed using the standard protocol following bolus administration of intravenous contrast.  RADIATION DOSE REDUCTION: This exam was performed according to the departmental dose-optimization program which includes automated exposure control, adjustment of the mA and/or kV according to patient size and/or use of iterative reconstruction technique.  CONTRAST:  75mL OMNIPAQUE IOHEXOL 300 MG/ML  SOLN  COMPARISON:  Ultrasound pelvis dated 11/01/2007  FINDINGS: Lower chest: No focal consolidation or pulmonary nodule in the lung bases. No pleural effusion or pneumothorax demonstrated. Partially imaged heart size is normal.  Hepatobiliary: No focal hepatic lesions. No intra or extrahepatic biliary ductal dilation. Normal gallbladder.  Pancreas: No focal lesions or main ductal dilation.  Spleen: Normal in size without focal abnormality.  Adrenals/Urinary Tract: 1.7 cm left  adrenal nodule. No right adrenal nodule. No suspicious renal mass, calculi or hydronephrosis. No focal bladder wall thickening.  Stomach/Bowel: Normal appearance of the stomach. No evidence of bowel wall thickening, distention, or  inflammatory changes. Moderate volume stool throughout the colon. Normal appendix.  Vascular/Lymphatic: Aortic atherosclerosis. No enlarged abdominal or pelvic lymph nodes.  Reproductive: 22.6 x 14.6 x 21.4 cm (2:41, 5:46) cystic mass demonstrating septal thickening, mural nodularity, and internal soft tissue components, appears to arise from the right adnexa. The mass extends to the level of the gallbladder and into the left hemiabdomen resulting in local mass effect. Definite invasion into adjacent soft tissue structures. Lobulated uterine contour with multifocal dystrophic calcifications, likely containing multiple leiomyomas, as seen on prior pelvic ultrasound examination. No left adnexal mass.  Other: Small volume free fluid.  No free air or fluid collection.  Musculoskeletal: No acute or abnormal lytic or blastic osseous lesions.  IMPRESSION: 1. A 22.6 cm complex cystic mass appears to arise from the right adnexa, highly suspicious for epithelial ovarian neoplasm. Recommend consultation to gynecology. 2. A 1.7 cm left adrenal nodule, indeterminate. This can be further evaluated with adrenal protocol CT or MRI or, alternatively, attention on follow-up. 3. Moderate volume stool throughout the colon. 4. Lobulated uterine contour with multifocal dystrophic calcifications, likely containing multiple leiomyomas, as seen on prior pelvic ultrasound examination. 5.  Aortic Atherosclerosis (ICD10-I70.0).  Results regarding the complex cystic mass were called by telephone at the time of interpretation on 09/03/2022 at 8:54 am to provider [facility] , who verbally acknowledged these results.   Electronically Signed By: Agustin Cree M.D. On:  09/03/2022 08:55

## 2022-09-06 NOTE — H&P (View-Only) (Signed)
GYNECOLOGIC ONCOLOGY NEW PATIENT CONSULTATION  Date of Service: 09/06/2022 Referring Provider: Kerri Perches, MD [address] [place],  [place] [address]   ASSESSMENT AND PLAN: Catherine Mclean is a 62 y.o. woman with a 22.6 cm complex mass from the right adnexa.  We reviewed that the exact etiology of the pelvic mass is unclear, but could include a benign, borderline, or malignant process.  The recommended treatment is surgical excision to make a definitive diagnosis.  Recommend obtaining tumor markers today.  Also recommend CT chest to complete imaging to rule out a metastatic process.  We reviewed the goal of removing the mass without leakage of fluid in case of a malignant process.  This would require at least a mini laparotomy for control drainage versus laparotomy.  Given her age, would recommend bilateral salpingo-oophorectomy at a minimum.  Given presumably postmenopausal bleeding given ongoing bleeding at her age, recommended endometrial biopsy today.  See procedure note below.  Hysterectomy may be recommended pending pathology from endometrial biopsy.  Otherwise, in the event of malignancy or borderline tumor on frozen section, we will perform indicated staging procedures. We discussed that these procedures may include hysterectomy, omentectomy pelvic and/or para-aortic lymphadenectomy, peritoneal biopsies. We would also remove any tissue concerning for metastatic disease which could require additional procedures including bowel surgery.  Patient was consented for: Mini laparotomy and controlled cyst drainage, robotic assisted bilateral salpingo-oophorectomy, possible hysterectomy, possible staging on 09/28/22.  The risks of surgery were discussed in detail and she understands these to including but not limited to bleeding requiring a blood transfusion, infection, injury to adjacent organs (including but not limited to the bowels, bladder, ureters, nerves, blood vessels),  thromboembolic events, wound separation, hernia, vaginal cuff separation, possible risk of lymphedema and lymphocyst if lymphadenectomy performed, unforseen complication, and possible need for re-exploration.  If the patient experiences any of these events, she understands that her hospitalization or recovery may be prolonged and that she may need to take additional medications for a prolonged period. The patient will receive DVT and antibiotic prophylaxis as indicated. She voiced a clear understanding. She had the opportunity to ask questions and informed consent was obtained today. She wishes to proceed.  She does not require preoperative clearance. Her METs are >4.  All preoperative instructions were reviewed. Postoperative expectations were also reviewed. Written handouts were provided to the patient.   A copy of this note was sent to the patient's referring provider.  Clide Cliff, MD Gynecologic Oncology   Medical Decision Making I personally spent  TOTAL 50 minutes face-to-face and non-face-to-face in the care of this patient, which includes all pre, intra, and post visit time on the date of service.    ------------  CC: Abdomino-pelvic mass  HISTORY OF PRESENT ILLNESS:  Catherine Mclean is a 62 y.o. woman who is seen in consultation at the request of Kerri Perches, MD for evaluation of abdomino-pelvic mass.  Patient was seen by her PCP on 08/20/2022 for her annual exam.  At that time she noted a 71-month history of abdominal distention and change in her bowel movements.  A CT abdomen/pelvis was performed on 09/03/2022 which noted a 22.6 cm complex cystic mass arising from the right adnexa.  She was also noted to have moderate volume stool throughout the colon.  Today, patient presents with her sister.  She denies any pain.  She notes some stress incontinence with laughing in the past 3 months.  She otherwise notes that she  is never gone 12 months without bleeding.  The  longest time she is gone is 6 months.  Bleeding is overall light.  She otherwise denies early satiety, significant weight loss.    PAST MEDICAL HISTORY: Past Medical History:  Diagnosis Date   Anxiety    Bradycardia    Depression    Hyperlipidemia    Hypertension     PAST SURGICAL HISTORY: Past Surgical History:  Procedure Laterality Date   BIOPSY  04/12/2018   Procedure: BIOPSY;  Surgeon: Malissa Hippo, MD;  [facility]: [facility];  Service: Endoscopy;;  appendicial orffice mucosa   COLONOSCOPY N/A 04/12/2018   Procedure: COLONOSCOPY;  Surgeon: Malissa Hippo, MD;  Location: [facility];  Service: Endoscopy;  Laterality: N/A;  930   OVARIAN CYST SURGERY     In her 20's, cystectomy?   POLYPECTOMY  04/12/2018   Procedure: POLYPECTOMY;  Surgeon: Malissa Hippo, MD;  Location: [facility];  Service: Endoscopy;;  splenic flexure    OB/GYN HISTORY: OB History  Gravida Para Term Preterm AB Living  1 1 1     1   SAB IAB Ectopic Multiple Live Births          1    # Outcome Date GA Lbr Len/2nd Weight Sex Type Anes PTL Lv  1 Term      Vag-Spont   LIV      Age at menarche: 71 Age at menopause: unknown, ongoing bleeding Hx of HRT: none Hx of STI: no Last pap: NILM, HPV - 04/17/2021 History of abnormal pap smears: no  SCREENING STUDIES:  Last mammogram: 06/2021 Last colonoscopy: 2020  MEDICATIONS:  Current Outpatient Medications:    ALPRAZolam (XANAX) 0.25 MG tablet, Take 1 tablet (0.25 mg total) by mouth at bedtime as needed for anxiety., Disp: 20 tablet, Rfl: 0   hydrochlorothiazide (HYDRODIURIL) 25 MG tablet, TAKE 1 TABLET(25 MG) BY MOUTH DAILY, Disp: 90 tablet, Rfl: 3   MYRBETRIQ 50 MG TB24 tablet, TAKE 1 TABLET(50 MG) BY MOUTH DAILY, Disp: 30 tablet, Rfl: 3   olmesartan (BENICAR) 20 MG tablet, Take 1 tablet (20 mg total) by mouth daily., Disp: 90 tablet, Rfl: 3   rosuvastatin (CRESTOR) 5 MG tablet, TAKE 1 TABLET(5 MG) BY MOUTH DAILY, Disp: 30 tablet, Rfl: 5    tiZANidine (ZANAFLEX) 4 MG tablet, TAKE 1 TABLET BY MOUTH TWICE DAILY, Disp: 60 tablet, Rfl: 5   Vitamin D, Ergocalciferol, (DRISDOL) 1.25 MG (50000 UNIT) CAPS capsule, Take 1 capsule (50,000 Units total) by mouth every 7 (seven) days., Disp: 5 capsule, Rfl: 5  ALLERGIES: Allergies  Allergen Reactions   Hydrochlorothiazide W-Triamterene     Muscle cramps   Spironolactone Other (See Comments)    Severe cramps    FAMILY HISTORY: Family History  Problem Relation Age of Onset   Hypertension Sister    Hypertension Sister    Hyperlipidemia Sister    Ulcerative colitis Sister    Arthritis/Rheumatoid Sister    Cancer Sister        unkonwn cancer   Breast cancer Neg Hx    Ovarian cancer Neg Hx    Endometrial cancer Neg Hx     SOCIAL HISTORY: Social History   Socioeconomic History   Marital status: Divorced    Spouse name: Not on file   Number of children: Not on file   Years of education: Not on file   Highest education level: 12th grade  Occupational History   Not on file  Tobacco  Use   Smoking status: Former    Current packs/day: 0.00    Types: Cigarettes    Quit date: 12/19/2016    Years since quitting: 5.7   Smokeless tobacco: Never   Tobacco comments:    quit after her last visit   Vaping Use   Vaping status: Former  Substance and Sexual Activity   Alcohol use: Yes    Comment: occ   Drug use: No   Sexual activity: Not Currently  Other Topics Concern   Not on file  Social History Narrative   Not on file   Social Determinants of Health   Financial Resource Strain: Low Risk  (08/23/2022)   Overall Financial Resource Strain (CARDIA)    Difficulty of Paying Living Expenses: Not hard at all  Food Insecurity: No Food Insecurity (08/23/2022)   Hunger Vital Sign    Worried About Running Out of Food in the Last Year: Never true    Ran Out of Food in the Last Year: Never true  Transportation Needs: No Transportation Needs (08/23/2022)   PRAPARE - Therapist, art (Medical): No    Lack of Transportation (Non-Medical): No  Physical Activity: Insufficiently Active (08/23/2022)   Exercise Vital Sign    Days of Exercise per Week: 3 days    Minutes of Exercise per Session: 30 min  Stress: Stress Concern Present (08/23/2022)   [employer] of Occupational Health - Occupational Stress Questionnaire    Feeling of Stress : To some extent  Social Connections: Moderately Integrated (08/23/2022)   Social Connection and Isolation Panel [NHANES]    Frequency of Communication with Friends and Family: More than three times a week    Frequency of Social Gatherings with Friends and Family: Once a week    Attends Religious Services: More than 4 times per year    Active Member of [employer] or Organizations: Yes    Attends Banker Meetings: 1 to 4 times per year    Marital Status: Divorced  Catering manager Violence: Not on file    REVIEW OF SYSTEMS: New patient intake form was reviewed.  Complete 10-system review is negative except for the following: urinary frequency  PHYSICAL EXAM: BP (!) 140/82 (BP Location: Right Arm, Patient Position: Sitting) Comment: manual cuff  Pulse (!) 50   Temp 98.3 F (36.8 C) (Oral)   Resp 17   Wt 143 lb (64.9 kg)   SpO2 99%   BMI 25.33 kg/m  Constitutional: No acute distress. Neuro/Psych: Alert, oriented.  Head and Neck: Normocephalic, atraumatic. Neck symmetric without masses. Sclera anicteric.  Respiratory: Normal work of breathing. Clear to auscultation bilaterally. Cardiovascular: Regular rate and rhythm, no murmurs, rubs, or gallops. Abdomen: Normoactive bowel sounds. Soft, non-distended, non-tender to palpation. Abdominopelvic mass fill lower abdomen and rising to above the level of the umbilicus. No evidence of hernia. Well healed pfannenstiel incision. Extremities: Grossly normal range of motion. Warm, well perfused. No edema bilaterally. Skin: No rashes or lesions. Lymphatic: No  cervical, supraclavicular, or inguinal adenopathy. Genitourinary: External genitalia without lesions. Urethral meatus without lesions or prolapse. On speculum exam, vagina and cervix without lesions. Bimanual exam reveals normal cervix, cervix deviate to the right, uterus deviated to the left but mobile. Large abdomino pelvic mass filling pelvis, somewhat mobile, likely arising from right adnexa. Exam chaperoned by Warner Mccreedy, NP   EMB Procedure: After appropriate verbal informed consent was obtained, a timeout was performed. A sterile speculum was placed in the vagina,  and the area was cleaned with betadine x3. A single-tooth tenaculum was placed on the anterior lip of the cervix. The os finder was used to dilate the cervix. An endometrial biopsy pipelle was advanced carefully to the uterine fundus which sounded to 8cm. An adequate sample was obtained over 2 passes with some polypoid tissue removd. The tenaculum was removed, and tenaculum sites were noted to be hemostatic. The speculum was removed from the vagina. The patient tolerated the procedure well.   UPT: not indicated   LABORATORY AND RADIOLOGIC DATA: Outside medical records were reviewed to synthesize the above history, along with the history and physical obtained during the visit.  Outside laboratory, and imaging reports were reviewed, with pertinent results below.  I personally reviewed the outside images.  WBC  Date Value Ref Range Status  08/20/2022 7.8 3.4 - 10.8 x10E3/uL Final  10/16/2018 9.3 3.8 - 10.8 Thousand/uL Final   Hemoglobin  Date Value Ref Range Status  08/20/2022 13.5 11.1 - 15.9 g/dL Final   Hematocrit  Date Value Ref Range Status  08/20/2022 41.5 34.0 - 46.6 % Final   Platelets  Date Value Ref Range Status  08/20/2022 263 150 - 450 x10E3/uL Final   Magnesium  Date Value Ref Range Status  02/04/2016 2.1 1.7 - 2.4 mg/dL Final   Creat  Date Value Ref Range Status  04/20/2019 0.85 0.50 - 0.99 mg/dL  Final    Comment:    For patients >43 years of age, the reference limit for Creatinine is approximately 13% higher for people identified as African-American. .    Creatinine, Ser  Date Value Ref Range Status  08/20/2022 1.06 (H) 0.57 - 1.00 mg/dL Final   AST  Date Value Ref Range Status  08/20/2022 32 0 - 40 IU/L Final   ALT  Date Value Ref Range Status  08/20/2022 11 0 - 32 IU/L Final   Diagnosis  Date Value Ref Range Status  04/17/2021   Final   - Negative for intraepithelial lesion or malignancy (NILM)  09/26/2017   Final   NEGATIVE FOR INTRAEPITHELIAL LESIONS OR MALIGNANCY.   HPV  Date Value Ref Range Status  09/26/2017 NOT DETECTED  Final    Comment:    Normal Reference Range - NOT Detected    CT Abdomen Pelvis W Contrast 09/03/2022  Narrative CLINICAL DATA:  One-month history of bloating decreased bowel movements. Reported history of ovarian surgery. * Tracking Code: BO *  EXAM: CT ABDOMEN AND PELVIS WITH CONTRAST  TECHNIQUE: Multidetector CT imaging of the abdomen and pelvis was performed using the standard protocol following bolus administration of intravenous contrast.  RADIATION DOSE REDUCTION: This exam was performed according to the departmental dose-optimization program which includes automated exposure control, adjustment of the mA and/or kV according to patient size and/or use of iterative reconstruction technique.  CONTRAST:  75mL OMNIPAQUE IOHEXOL 300 MG/ML  SOLN  COMPARISON:  Ultrasound pelvis dated 11/01/2007  FINDINGS: Lower chest: No focal consolidation or pulmonary nodule in the lung bases. No pleural effusion or pneumothorax demonstrated. Partially imaged heart size is normal.  Hepatobiliary: No focal hepatic lesions. No intra or extrahepatic biliary ductal dilation. Normal gallbladder.  Pancreas: No focal lesions or main ductal dilation.  Spleen: Normal in size without focal abnormality.  Adrenals/Urinary Tract: 1.7 cm left  adrenal nodule. No right adrenal nodule. No suspicious renal mass, calculi or hydronephrosis. No focal bladder wall thickening.  Stomach/Bowel: Normal appearance of the stomach. No evidence of bowel wall thickening, distention, or  inflammatory changes. Moderate volume stool throughout the colon. Normal appendix.  Vascular/Lymphatic: Aortic atherosclerosis. No enlarged abdominal or pelvic lymph nodes.  Reproductive: 22.6 x 14.6 x 21.4 cm (2:41, 5:46) cystic mass demonstrating septal thickening, mural nodularity, and internal soft tissue components, appears to arise from the right adnexa. The mass extends to the level of the gallbladder and into the left hemiabdomen resulting in local mass effect. Definite invasion into adjacent soft tissue structures. Lobulated uterine contour with multifocal dystrophic calcifications, likely containing multiple leiomyomas, as seen on prior pelvic ultrasound examination. No left adnexal mass.  Other: Small volume free fluid.  No free air or fluid collection.  Musculoskeletal: No acute or abnormal lytic or blastic osseous lesions.  IMPRESSION: 1. A 22.6 cm complex cystic mass appears to arise from the right adnexa, highly suspicious for epithelial ovarian neoplasm. Recommend consultation to gynecology. 2. A 1.7 cm left adrenal nodule, indeterminate. This can be further evaluated with adrenal protocol CT or MRI or, alternatively, attention on follow-up. 3. Moderate volume stool throughout the colon. 4. Lobulated uterine contour with multifocal dystrophic calcifications, likely containing multiple leiomyomas, as seen on prior pelvic ultrasound examination. 5.  Aortic Atherosclerosis (ICD10-I70.0).  Results regarding the complex cystic mass were called by telephone at the time of interpretation on 09/03/2022 at 8:54 am to provider [facility] , who verbally acknowledged these results.   Electronically Signed By: Agustin Cree M.D. On:  09/03/2022 08:55

## 2022-09-07 ENCOUNTER — Ambulatory Visit: Payer: 59 | Admitting: Family Medicine

## 2022-09-07 LAB — CA 125: Cancer Antigen (CA) 125: 39.4 U/mL — ABNORMAL HIGH (ref 0.0–38.1)

## 2022-09-07 NOTE — Patient Instructions (Signed)
Today you had an endometrial biopsy which is a sample from the lining of the uterus. You may have spotting from this or see a grayish discharge from the medication used to stop bleeding.   Preparing for your Surgery   Plan for surgery on September 28, 2022 with Dr. Clide Cliff at [facility]. You will be scheduled for robotic assisted total laparoscopic hysterectomy (removal of the uterus and cervix), bilateral salpingo-oophorectomy (removal of both ovaries and fallopian tubes), possible staging, mini laparotomy for controlled cyst drainage, possible laparotomy (larger incision on your abdomen if needed).    Pre-operative Testing -You will receive a phone call from presurgical testing at [facility] to arrange for a pre-operative appointment and lab work.   -Bring your insurance card, copy of an advanced directive if applicable, medication list   -At that visit, you will be asked to sign a consent for a possible blood transfusion in case a transfusion becomes necessary during surgery.  The need for a blood transfusion is rare but having consent is a necessary part of your care.      -You should not be taking blood thinners or aspirin at least ten days prior to surgery unless instructed by your surgeon.   -Do not take supplements such as fish oil (omega 3), red yeast rice, turmeric before your surgery. You want to avoid medications with aspirin in them including headache powders such as BC or Goody's), Excedrin migraine.   Day Before Surgery at Home -You will be asked to take in a light diet the day before surgery. You will be advised you can have clear liquids up until 3 hours before your surgery.     Eat a light diet the day before surgery.  Examples including soups, broths, toast, yogurt, mashed potatoes.  AVOID GAS PRODUCING FOODS AND BEVERAGES. Things to avoid include carbonated beverages (fizzy beverages, sodas), raw fruits and raw vegetables (uncooked), or beans.    If  your bowels are filled with gas, your surgeon will have difficulty visualizing your pelvic organs which increases your surgical risks.   Your role in recovery Your role is to become active as soon as directed by your doctor, while still giving yourself time to heal.  Rest when you feel tired. You will be asked to do the following in order to speed your recovery:   - Cough and breathe deeply. This helps to clear and expand your lungs and can prevent pneumonia after surgery.  - STAY ACTIVE WHEN YOU GET HOME. Do mild physical activity. Walking or moving your legs help your circulation and body functions return to normal. Do not try to get up or walk alone the first time after surgery.   -If you develop swelling on one leg or the other, pain in the back of your leg, redness/warmth in one of your legs, please call the office or go to the Emergency Room to have a doppler to rule out a blood clot. For shortness of breath, chest pain-seek care in the Emergency Room as soon as possible. - Actively manage your pain. Managing your pain lets you move in comfort. We will ask you to rate your pain on a scale of zero to 10. It is your responsibility to tell your doctor or nurse where and how much you hurt so your pain can be treated.   Special Considerations -If you are diabetic, you may be placed on insulin after surgery to have closer control over your blood sugars to promote  healing and recovery.  This does not mean that you will be discharged on insulin.  If applicable, your oral antidiabetics will be resumed when you are tolerating a solid diet.   -Your final pathology results from surgery should be available around one week after surgery and the results will be relayed to you when available.   -Dr. Antionette Char is the surgeon that assists your GYN Oncologist with surgery.  If you end up staying the night, the next day after your surgery you will either see Dr. Pricilla Holm, Dr. Alvester Morin, or Dr. Antionette Char.   -FMLA forms can be faxed to [phone] and please allow 5-7 business days for completion.   Pain Management After Surgery -You have been prescribed your pain medication and bowel regimen medications before surgery so that you can have these available when you are discharged from the hospital. The pain medication is for use ONLY AFTER surgery and a new prescription will not be given.    -Make sure that you have Tylenol and Ibuprofen IF YOU ARE ABLE TO TAKE THESE MEDICATIONS at home to use on a regular basis after surgery for pain control. We recommend alternating the medications every hour to six hours since they work differently and are processed in the body differently for pain relief.   -Review the attached handout on narcotic use and their risks and side effects.    Bowel Regimen -You have been prescribed Sennakot-S to take nightly to prevent constipation especially if you are taking the narcotic pain medication intermittently.  It is important to prevent constipation and drink adequate amounts of liquids. You can stop taking this medication when you are not taking pain medication and you are back on your normal bowel routine.   Risks of Surgery Risks of surgery are low but include bleeding, infection, damage to surrounding structures, re-operation, blood clots, and very rarely death.     Blood Transfusion Information (For the consent to be signed before surgery)   We will be checking your blood type before surgery so in case of emergencies, we will know what type of blood you would need.                                             WHAT IS A BLOOD TRANSFUSION?   A transfusion is the replacement of blood or some of its parts. Blood is made up of multiple cells which provide different functions. Red blood cells carry oxygen and are used for blood loss replacement. White blood cells fight against infection. Platelets control bleeding. Plasma helps clot blood. Other  blood products are available for specialized needs, such as hemophilia or other clotting disorders. BEFORE THE TRANSFUSION  Who gives blood for transfusions?  You may be able to donate blood to be used at a later date on yourself (autologous donation). Relatives can be asked to donate blood. This is generally not any safer than if you have received blood from a stranger. The same precautions are taken to ensure safety when a relative's blood is donated. Healthy volunteers who are fully evaluated to make sure their blood is safe. This is blood bank blood. Transfusion therapy is the safest it has ever been in the practice of medicine. Before blood is taken from a donor, a complete history is taken to make sure that person has no history of diseases nor engages in  risky social behavior (examples are intravenous drug use or sexual activity with multiple partners). The donor's travel history is screened to minimize risk of transmitting infections, such as malaria. The donated blood is tested for signs of infectious diseases, such as HIV and hepatitis. The blood is then tested to be sure it is compatible with you in order to minimize the chance of a transfusion reaction. If you or a relative donates blood, this is often done in anticipation of surgery and is not appropriate for emergency situations. It takes many days to process the donated blood. RISKS AND COMPLICATIONS Although transfusion therapy is very safe and saves many lives, the main dangers of transfusion include:  Getting an infectious disease. Developing a transfusion reaction. This is an allergic reaction to something in the blood you were given. Every precaution is taken to prevent this. The decision to have a blood transfusion has been considered carefully by your caregiver before blood is given. Blood is not given unless the benefits outweigh the risks.   AFTER SURGERY INSTRUCTIONS   Return to work: 4-6 weeks if applicable   Activity: 1.  Be up and out of the bed during the day.  Take a nap if needed.  You may walk up steps but be careful and use the hand rail.  Stair climbing will tire you more than you think, you may need to stop part way and rest.    2. No lifting or straining for 6 weeks over 10 pounds. No pushing, pulling, straining for 6 weeks.   3. No driving for around 1 week(s).  Do not drive if you are taking narcotic pain medicine and make sure that your reaction time has returned.    4. You can shower as soon as the next day after surgery. Shower daily.  Use your regular soap and water (not directly on the incision) and pat your incision(s) dry afterwards; don't rub.  No tub baths or submerging your body in water until cleared by your surgeon. If you have the soap that was given to you by pre-surgical testing that was used before surgery, you do not need to use it afterwards because this can irritate your incisions.    5. No sexual activity and nothing in the vagina for 12 weeks.   6. You may experience a small amount of clear drainage from your incisions, which is normal.  If the drainage persists, increases, or changes color please call the office.   7. Do not use creams, lotions, or ointments such as neosporin on your incisions after surgery until advised by your surgeon because they can cause removal of the dermabond glue on your incisions.     8. You may experience vaginal spotting after surgery or when the stitches at the top of the vagina begin to dissolve.  The spotting is normal but if you experience heavy bleeding, call our office.   9. Take Tylenol or ibuprofen first for pain if you are able to take these medications and only use narcotic pain medication for severe pain not relieved by the Tylenol or Ibuprofen.  Monitor your Tylenol intake to a max of 4,000 mg in a 24 hour period. You can alternate these medications after surgery.   Diet: 1. Low sodium Heart Healthy Diet is recommended but you are cleared to  resume your normal (before surgery) diet after your procedure.   2. It is safe to use a laxative, such as Miralax or Colace, if you have difficulty moving your  bowels. You have been prescribed Sennakot-S to take at bedtime every evening after surgery to keep bowel movements regular and to prevent constipation.     Wound Care: 1. Keep clean and dry.  Shower daily.   Reasons to call the Doctor: Fever - Oral temperature greater than 100.4 degrees Fahrenheit Foul-smelling vaginal discharge Difficulty urinating Nausea and vomiting Increased pain at the site of the incision that is unrelieved with pain medicine. Difficulty breathing with or without chest pain New calf pain especially if only on one side Sudden, continuing increased vaginal bleeding with or without clots.   Contacts: For questions or concerns you should contact:   Dr. Clide Cliff at [phone]   Warner Mccreedy, NP at [phone]   After Hours: call [phone] and have the GYN Oncologist paged/contacted (after 5 pm or on the weekends). You will speak with an after hours RN and let he or she know you have had surgery.   Messages sent via mychart are for non-urgent matters and are not responded to after hours so for urgent needs, please call the after hours number.

## 2022-09-07 NOTE — Progress Notes (Signed)
Patient here for new patient consultation with Dr. Alvester Morin and for a pre-operative appointment prior to her scheduled surgery on 09/28/2022. She is scheduled for a robotic assisted total laparoscopic hysterectomy, bilateral salpingo-oophorectomy, possible staging, mini laparotomy for controlled cyst drainage, possible laparotomy. The surgery was discussed in detail.  See after visit summary for additional details. Visual aids used to discuss items related to surgery.   Discussed post-op pain management in detail including the aspects of the enhanced recovery pathway.  Advised her that a new prescription would be sent in for tramadol and it is only to be used for after her upcoming surgery.  We discussed the use of tylenol post-op and to monitor for a maximum of 4,000 mg in a 24 hour period.  Also prescribed sennakot to be used after surgery and to hold if having loose stools.  Discussed bowel regimen in detail.     Discussed measures to take at home to prevent DVT including frequent mobility.  Reportable signs and symptoms of DVT discussed. Post-operative instructions discussed and expectations for after surgery. Incisional care discussed as well including reportable signs and symptoms including erythema, drainage, wound separation.     10 minutes spent with the patient/preparing information.  Verbalizing understanding of material discussed. No needs or concerns voiced at the end of the visit.   Advised patient to call for any needs.  Advised that her post-operative medications had been prescribed and could be picked up at any time.    This appointment is included in the global surgical bundle as pre-operative teaching and has no charge.

## 2022-09-08 ENCOUNTER — Encounter: Payer: Self-pay | Admitting: Psychiatry

## 2022-09-08 LAB — SURGICAL PATHOLOGY

## 2022-09-13 ENCOUNTER — Encounter (HOSPITAL_COMMUNITY): Payer: Self-pay

## 2022-09-13 ENCOUNTER — Ambulatory Visit (HOSPITAL_COMMUNITY)
Admission: RE | Admit: 2022-09-13 | Discharge: 2022-09-13 | Disposition: A | Discharge status: Home / Self Care | Payer: 59 | Source: Ambulatory Visit | Attending: Psychiatry | Admitting: Psychiatry

## 2022-09-13 ENCOUNTER — Other Ambulatory Visit: Payer: Self-pay | Admitting: Family Medicine

## 2022-09-13 DIAGNOSIS — N9489 Other specified conditions associated with female genital organs and menstrual cycle: Secondary | ICD-10-CM | POA: Diagnosis not present

## 2022-09-13 MED ORDER — SODIUM CHLORIDE (PF) 0.9 % IJ SOLN
INTRAMUSCULAR | Status: AC
  Filled 2022-09-13: qty 50

## 2022-09-13 MED ORDER — IOHEXOL 300 MG/ML  SOLN
75.0000 mL | Freq: Once | INTRAMUSCULAR | Status: AC | PRN
  Administered 2022-09-13: 75 mL via INTRAVENOUS

## 2022-09-14 ENCOUNTER — Encounter: Payer: Self-pay | Admitting: Family Medicine

## 2022-09-14 NOTE — Progress Notes (Signed)
Sent message, via epic in basket, requesting orders in epic from surgeon.  

## 2022-09-20 NOTE — Patient Instructions (Signed)
SURGICAL WAITING ROOM VISITATION  Patients having surgery or a procedure may have no more than 2 support people in the waiting area - these visitors may rotate.    Children under the age of 19 must have an adult with them who is not the patient.  Due to an increase in RSV and influenza rates and associated hospitalizations, children ages 35 and under may not visit patients in [facility] hospitals.  If the patient needs to stay at the hospital during part of their recovery, the visitor guidelines for inpatient rooms apply. Pre-op nurse will coordinate an appropriate time for 1 support person to accompany patient in pre-op.  This support person may not rotate.    Please refer to the [facility] website for the visitor guidelines for Inpatients (after your surgery is over and you are in a regular room).    Your procedure is scheduled on: 09/28/22   Report to [facility] Main Entrance    Report to admitting at 12:00 PM   Call this number if you have problems the morning of surgery [phone]   Do not eat food :After Midnight.   After Midnight you may have the following liquids until 11:15 AM DAY OF SURGERY  Water Non-Citrus Juices (without pulp, NO RED-Apple, White grape, White cranberry) Black Coffee (NO MILK/CREAM OR CREAMERS, sugar ok)  Clear Tea (NO MILK/CREAM OR CREAMERS, sugar ok) regular and decaf                             Plain Jell-O (NO RED)                                           Fruit ices (not with fruit pulp, NO RED)                                     Popsicles (NO RED)                                                               Sports drinks like Gatorade (NO RED)                      If you have questions, please contact your surgeon's office.   FOLLOW BOWEL PREP AND ANY ADDITIONAL PRE OP INSTRUCTIONS YOU RECEIVED FROM YOUR SURGEON'S OFFICE!!!     Oral Hygiene is also important to reduce your risk of infection.                                     Remember - BRUSH YOUR TEETH THE MORNING OF SURGERY WITH YOUR REGULAR TOOTHPASTE  DENTURES WILL BE REMOVED PRIOR TO SURGERY PLEASE DO NOT APPLY "Poly grip" OR ADHESIVES!!!   Stop all vitamins and herbal supplements 7 days before surgery.   Take these medicines the morning of surgery with A SIP OF WATER: Tylenol, Rosuvastatin  You may not have any metal on your body including hair pins, jewelry, and body piercing             Do not wear make-up, lotions, powders, perfumes, or deodorant  Do not wear nail polish including gel and S&S, artificial/acrylic nails, or any other type of covering on natural nails including finger and toenails. If you have artificial nails, gel coating, etc. that needs to be removed by a nail salon please have this removed prior to surgery or surgery may need to be canceled/ delayed if the surgeon/ anesthesia feels like they are unable to be safely monitored.   Do not shave  48 hours prior to surgery.    Do not bring valuables to the hospital. [facility] IS NOT             RESPONSIBLE   FOR VALUABLES.   Contacts, glasses, dentures or bridgework may not be worn into surgery.  DO NOT BRING YOUR HOME MEDICATIONS TO THE HOSPITAL. PHARMACY WILL DISPENSE MEDICATIONS LISTED ON YOUR MEDICATION LIST TO YOU DURING YOUR ADMISSION IN THE HOSPITAL!    Patients discharged on the day of surgery will not be allowed to drive home.  Someone NEEDS to stay with you for the first 24 hours after anesthesia.              Please read over the following fact sheets you were given: IF YOU HAVE QUESTIONS ABOUT YOUR PRE-OP INSTRUCTIONS PLEASE CALL [phone] Contras    If you received a COVID test during your pre-op visit  it is requested that you wear a mask when out in public, stay away from anyone that may not be feeling well and notify your surgeon if you develop symptoms. If you test positive for Covid or have been in contact with anyone that has tested  positive in the last 10 days please notify you surgeon.    [facility] - Preparing for Surgery Before surgery, you can play an important role.  Because skin is not sterile, your skin needs to be as free of germs as possible.  You can reduce the number of germs on your skin by washing with CHG (chlorahexidine gluconate) soap before surgery.  CHG is an antiseptic cleaner which kills germs and bonds with the skin to continue killing germs even after washing. Please DO NOT use if you have an allergy to CHG or antibacterial soaps.  If your skin becomes reddened/irritated stop using the CHG and inform your nurse when you arrive at Short Stay. Do not shave (including legs and underarms) for at least 48 hours prior to the first CHG shower.  You may shave your face/neck.  Please follow these instructions carefully:  1.  Shower with CHG Soap the night before surgery and the  morning of surgery.  2.  If you choose to wash your hair, wash your hair first as usual with your normal  shampoo.  3.  After you shampoo, rinse your hair and body thoroughly to remove the shampoo.                             4.  Use CHG as you would any other liquid soap.  You can apply chg directly to the skin and wash.  Gently with a scrungie or clean washcloth.  5.  Apply the CHG Soap to your body ONLY FROM THE NECK DOWN.   Do   not use on  face/ open                           Wound or open sores. Avoid contact with eyes, ears mouth and   genitals (private parts).                       Wash face,  Genitals (private parts) with your normal soap.             6.  Wash thoroughly, paying special attention to the area where your    surgery  will be performed.  7.  Thoroughly rinse your body with warm water from the neck down.  8.  DO NOT shower/wash with your normal soap after using and rinsing off the CHG Soap.                9.  Pat yourself dry with a clean towel.            10.  Wear clean pajamas.            11.  Place clean sheets  on your bed the night of your first shower and do not  sleep with pets. Day of Surgery : Do not apply any lotions/deodorants the morning of surgery.  Please wear clean clothes to the hospital/surgery center.  FAILURE TO FOLLOW THESE INSTRUCTIONS MAY RESULT IN THE CANCELLATION OF YOUR SURGERY  PATIENT SIGNATURE_________________________________  NURSE SIGNATURE__________________________________  ________________________________________________________________________ WHAT IS A BLOOD TRANSFUSION? Blood Transfusion Information  A transfusion is the replacement of blood or some of its parts. Blood is made up of multiple cells which provide different functions. Red blood cells carry oxygen and are used for blood loss replacement. White blood cells fight against infection. Platelets control bleeding. Plasma helps clot blood. Other blood products are available for specialized needs, such as hemophilia or other clotting disorders. BEFORE THE TRANSFUSION  Who gives blood for transfusions?  Healthy volunteers who are fully evaluated to make sure their blood is safe. This is blood bank blood. Transfusion therapy is the safest it has ever been in the practice of medicine. Before blood is taken from a donor, a complete history is taken to make sure that person has no history of diseases nor engages in risky social behavior (examples are intravenous drug use or sexual activity with multiple partners). The donor's travel history is screened to minimize risk of transmitting infections, such as malaria. The donated blood is tested for signs of infectious diseases, such as HIV and hepatitis. The blood is then tested to be sure it is compatible with you in order to minimize the chance of a transfusion reaction. If you or a relative donates blood, this is often done in anticipation of surgery and is not appropriate for emergency situations. It takes many days to process the donated blood. RISKS AND  COMPLICATIONS Although transfusion therapy is very safe and saves many lives, the main dangers of transfusion include:  Getting an infectious disease. Developing a transfusion reaction. This is an allergic reaction to something in the blood you were given. Every precaution is taken to prevent this. The decision to have a blood transfusion has been considered carefully by your caregiver before blood is given. Blood is not given unless the benefits outweigh the risks. AFTER THE TRANSFUSION Right after receiving a blood transfusion, you will usually feel much better and more energetic. This is especially true if your red blood cells have gotten  low (anemic). The transfusion raises the level of the red blood cells which carry oxygen, and this usually causes an energy increase. The nurse administering the transfusion will monitor you carefully for complications. HOME CARE INSTRUCTIONS  No special instructions are needed after a transfusion. You may find your energy is better. Speak with your caregiver about any limitations on activity for underlying diseases you may have. SEEK MEDICAL CARE IF:  Your condition is not improving after your transfusion. You develop redness or irritation at the intravenous (IV) site. SEEK IMMEDIATE MEDICAL CARE IF:  Any of the following symptoms occur over the next 12 hours: Shaking chills. You have a temperature by mouth above 102 F (38.9 C), not controlled by medicine. Chest, back, or muscle pain. People around you feel you are not acting correctly or are confused. Shortness of breath or difficulty breathing. Dizziness and fainting. You get a rash or develop hives. You have a decrease in urine output. Your urine turns a dark color or changes to pink, red, or brown. Any of the following symptoms occur over the next 10 days: You have a temperature by mouth above 102 F (38.9 C), not controlled by medicine. Shortness of breath. Weakness after normal activity. The  white part of the eye turns yellow (jaundice). You have a decrease in the amount of urine or are urinating less often. Your urine turns a dark color or changes to pink, red, or brown. Document Released: 02/06/2000 Document Revised: 05/03/2011 Document Reviewed: 09/25/2007 [facility] Patient Information 2014 [place], [place].  _______________________________________________________________________

## 2022-09-20 NOTE — Progress Notes (Signed)
COVID Vaccine Completed: yes  Date of COVID positive in last 90 days:  PCP - Syliva Overman, MD Cardiologist -   Chest x-ray - CT 09/13/22 Epic EKG -  Stress Test -  ECHO -  Cardiac Cath -  Pacemaker/ICD device last checked: Spinal Cord Stimulator:  Bowel Prep - light diet day before  Sleep Study -  CPAP -   Fasting Blood Sugar -  Checks Blood Sugar _____ times a day  Last dose of GLP1 agonist-  N/A GLP1 instructions:  N/A   Last dose of SGLT-2 inhibitors-  N/A SGLT-2 instructions: N/A   Blood Thinner Instructions:  Time Aspirin Instructions: Last Dose:  Activity level:  Can go up a flight of stairs and perform activities of daily living without stopping and without symptoms of chest pain or shortness of breath.  Able to exercise without symptoms  Unable to go up a flight of stairs without symptoms of     Anesthesia review:   Patient denies shortness of breath, fever, cough and chest pain at PAT appointment  Patient verbalized understanding of instructions that were given to them at the PAT appointment. Patient was also instructed that they will need to review over the PAT instructions again at home before surgery.

## 2022-09-21 ENCOUNTER — Encounter (HOSPITAL_COMMUNITY)
Admission: RE | Admit: 2022-09-21 | Discharge: 2022-09-21 | Disposition: A | Discharge status: Home / Self Care | Payer: 59 | Source: Ambulatory Visit | Attending: Psychiatry | Admitting: Psychiatry

## 2022-09-21 ENCOUNTER — Encounter (HOSPITAL_COMMUNITY): Payer: Self-pay

## 2022-09-21 ENCOUNTER — Telehealth: Payer: Self-pay | Admitting: *Deleted

## 2022-09-21 ENCOUNTER — Other Ambulatory Visit: Payer: Self-pay

## 2022-09-21 VITALS — BP 118/63 | HR 64 | Temp 97.4°F | Resp 18 | Ht 62.5 in | Wt 140.0 lb

## 2022-09-21 DIAGNOSIS — N9489 Other specified conditions associated with female genital organs and menstrual cycle: Secondary | ICD-10-CM | POA: Diagnosis not present

## 2022-09-21 DIAGNOSIS — I10 Essential (primary) hypertension: Secondary | ICD-10-CM | POA: Diagnosis not present

## 2022-09-21 DIAGNOSIS — R001 Bradycardia, unspecified: Secondary | ICD-10-CM | POA: Diagnosis not present

## 2022-09-21 DIAGNOSIS — Z01818 Encounter for other preprocedural examination: Secondary | ICD-10-CM | POA: Diagnosis present

## 2022-09-21 HISTORY — DX: Unspecified osteoarthritis, unspecified site: M19.90

## 2022-09-21 LAB — COMPREHENSIVE METABOLIC PANEL
ALT: 18 U/L (ref 0–44)
AST: 33 U/L (ref 15–41)
Albumin: 4 g/dL (ref 3.5–5.0)
Alkaline Phosphatase: 72 U/L (ref 38–126)
Anion gap: 8 (ref 5–15)
BUN: 13 mg/dL (ref 8–23)
CO2: 28 mmol/L (ref 22–32)
Calcium: 10.1 mg/dL (ref 8.9–10.3)
Chloride: 101 mmol/L (ref 98–111)
Creatinine, Ser: 1.04 mg/dL — ABNORMAL HIGH (ref 0.44–1.00)
GFR, Estimated: >60 mL/min (ref >60)
Glucose, Bld: 84 mg/dL (ref 70–99)
Potassium: 3.2 mmol/L — ABNORMAL LOW (ref 3.5–5.1)
Sodium: 137 mmol/L (ref 135–145)
Total Bilirubin: 0.4 mg/dL (ref 0.3–1.2)
Total Protein: 8 g/dL (ref 6.5–8.1)

## 2022-09-21 LAB — CBC
HCT: 41.1 % (ref 36.0–46.0)
Hemoglobin: 13.1 g/dL (ref 12.0–15.0)
MCH: 27.5 pg (ref 26.0–34.0)
MCHC: 31.9 g/dL (ref 30.0–36.0)
MCV: 86.2 fL (ref 80.0–100.0)
Platelets: 223 10*3/uL (ref 150–400)
RBC: 4.77 MIL/uL (ref 3.87–5.11)
RDW: 14.5 % (ref 11.5–15.5)
WBC: 7.7 10*3/uL (ref 4.0–10.5)
nRBC: 0 % (ref 0.0–0.2)

## 2022-09-21 LAB — TYPE AND SCREEN
ABO/RH(D): O POS
Antibody Screen: NEGATIVE

## 2022-09-21 NOTE — Telephone Encounter (Signed)
-----   Message from Doylene Bode sent at 09/21/2022  1:20 PM EDT ----- Potassium is slightly low. Please let her know. Her kidney function is also slightly elevated. She can increase intake of K+ foods but dont go overboard given kidney function. Please fax results to her PCP too. Thanks ----- Message ----- From: Leory Plowman, [facility] [place] Sent: 09/21/2022  10:58 AM EDT To: Doylene Bode, NP

## 2022-09-21 NOTE — Telephone Encounter (Addendum)
Attempted to reach patient in regards to her CMP results. Left message requesting a call back to [phone]. CMP results faxed to pt's PCP Dr. Syliva Overman at [phone].

## 2022-09-22 ENCOUNTER — Encounter (HOSPITAL_COMMUNITY): Payer: Self-pay | Admitting: Physician Assistant

## 2022-09-22 ENCOUNTER — Other Ambulatory Visit: Payer: Self-pay

## 2022-09-22 ENCOUNTER — Encounter (HOSPITAL_COMMUNITY): Payer: Self-pay | Admitting: Anesthesiology

## 2022-09-22 ENCOUNTER — Encounter: Payer: Self-pay | Admitting: Family Medicine

## 2022-09-22 DIAGNOSIS — N838 Other noninflammatory disorders of ovary, fallopian tube and broad ligament: Secondary | ICD-10-CM

## 2022-09-22 MED ORDER — POTASSIUM CHLORIDE CRYS ER 10 MEQ PO TBCR: 10.0000 meq | EXTENDED_RELEASE_TABLET | Freq: Two times a day (BID) | ORAL | 0 refills | Status: DC

## 2022-09-22 NOTE — Telephone Encounter (Signed)
3 rd attempt to reach patient in regards to her CMP results. Left message requesting a call back to [phone].

## 2022-09-22 NOTE — Telephone Encounter (Signed)
2nd attempt to reach Catherine Mclean to go over CMP results. Left message requesting call back.

## 2022-09-27 ENCOUNTER — Telehealth: Payer: Self-pay | Admitting: *Deleted

## 2022-09-27 NOTE — Telephone Encounter (Signed)
Second attempt to reach patient for pre-op call, left message requesting call back to [phone].

## 2022-09-27 NOTE — Telephone Encounter (Signed)
Attempted to reach Catherine Mclean for her pre-op call. Left voicemail requesting call back to [phone].

## 2022-09-27 NOTE — Telephone Encounter (Signed)
Spoke with Catherine Mclean who returned the office call to check on pre-operative status.  Patient compliant with pre-operative instructions.  Reinforced nothing to eat after midnight. Clear liquids until 1100. Patient to arrive at 1200.  No questions or concerns voiced.  Instructed to call for any needs.

## 2022-09-28 ENCOUNTER — Encounter (HOSPITAL_COMMUNITY): Payer: Self-pay | Admitting: Psychiatry

## 2022-09-28 ENCOUNTER — Ambulatory Visit (HOSPITAL_COMMUNITY)
Admission: RE | Admit: 2022-09-28 | Discharge: 2022-09-28 | Disposition: A | Discharge status: Home / Self Care | Payer: 59 | Attending: Psychiatry | Admitting: Psychiatry

## 2022-09-28 DIAGNOSIS — N9489 Other specified conditions associated with female genital organs and menstrual cycle: Secondary | ICD-10-CM | POA: Insufficient documentation

## 2022-09-28 DIAGNOSIS — Z539 Procedure and treatment not carried out, unspecified reason: Secondary | ICD-10-CM | POA: Diagnosis not present

## 2022-09-28 MED ORDER — ACETAMINOPHEN 500 MG PO TABS
1000.0000 mg | ORAL_TABLET | ORAL | Status: AC
  Administered 2022-09-28: 1000 mg via ORAL
  Filled 2022-09-28: qty 2

## 2022-09-28 MED ORDER — CHLORHEXIDINE GLUCONATE 0.12 % MT SOLN
15.0000 mL | Freq: Once | OROMUCOSAL | Status: AC
  Administered 2022-09-28: 15 mL via OROMUCOSAL

## 2022-09-28 MED ORDER — CEFAZOLIN SODIUM-DEXTROSE 2-4 GM/100ML-% IV SOLN
2.0000 g | INTRAVENOUS | Status: DC
  Filled 2022-09-28: qty 100

## 2022-09-28 MED ORDER — POVIDONE-IODINE 10 % EX SWAB
2.0000 | Freq: Once | CUTANEOUS | Status: AC
  Administered 2022-09-28: 2 via TOPICAL

## 2022-09-28 MED ORDER — LACTATED RINGERS IV SOLN: INTRAVENOUS | Status: DC

## 2022-09-28 MED ORDER — ORAL CARE MOUTH RINSE: 15.0000 mL | Freq: Once | OROMUCOSAL | Status: AC

## 2022-09-28 MED ORDER — HEPARIN SODIUM (PORCINE) 5000 UNIT/ML IJ SOLN
5000.0000 [IU] | INTRAMUSCULAR | Status: AC
  Administered 2022-09-28: 5000 [IU] via SUBCUTANEOUS
  Filled 2022-09-28: qty 1

## 2022-09-28 MED ORDER — DEXAMETHASONE SODIUM PHOSPHATE 4 MG/ML IJ SOLN: 4.0000 mg | INTRAMUSCULAR | Status: DC

## 2022-09-28 NOTE — Discharge Instructions (Addendum)
Plan for surgery this Thursday with Dr. Pricilla Holm, Dr.  Blas partner. We will reach out tomorrow to discuss details further.

## 2022-09-28 NOTE — Interval H&P Note (Signed)
History and Physical Interval Note:  09/28/2022 6:49 PM  Catherine Mclean  has presented today for surgery. Given complexity of of other surgeries earlier in the day, this case would have begun late in the evening.  Advised patient that I would recommend reschedule her surgery for another day as to provide her the safest environment for her case.  Offered patient options to have a sooner surgery with my partner, Dr. Pricilla Holm, on Thursday, or reschedule for two weeks from now.  Patient is in agreement with rescheduling to Thursday and understanding of the situation.  All questions answered.    , 

## 2022-09-29 ENCOUNTER — Encounter: Payer: Self-pay | Admitting: Psychiatry

## 2022-09-29 ENCOUNTER — Telehealth: Payer: Self-pay | Admitting: *Deleted

## 2022-09-29 ENCOUNTER — Encounter: Payer: Self-pay | Admitting: *Deleted

## 2022-09-29 ENCOUNTER — Other Ambulatory Visit: Payer: Self-pay | Admitting: Gynecologic Oncology

## 2022-09-29 NOTE — Telephone Encounter (Signed)
Telephone call to check on pre-operative status.  Patient compliant with pre-operative instructions.  Reinforced nothing to eat after midnight. Clear liquids until 1115. Patient to arrive at 1215. No questions or concerns voiced.  Instructed to call for any needs. 

## 2022-09-29 NOTE — Telephone Encounter (Signed)
Attempted to reach Catherine Mclean for her pre-op call, voicemail left requesting call back to [phone].

## 2022-09-29 NOTE — Anesthesia Preprocedure Evaluation (Addendum)
Anesthesia Evaluation  Patient identified by MRN, date of birth, ID band Patient awake    Reviewed: Allergy & Precautions, NPO status , Patient's Chart, lab work & pertinent test results  History of Anesthesia Complications Negative for: history of anesthetic complications  Airway Mallampati: II  TM Distance: >3 FB Neck ROM: Full    Dental  (+) Missing,    Pulmonary former smoker   Pulmonary exam normal        Cardiovascular hypertension, Pt. on medications Normal cardiovascular exam     Neuro/Psych   Anxiety Depression    negative neurological ROS  negative psych ROS   GI/Hepatic negative GI ROS, Neg liver ROS,,,  Endo/Other  negative endocrine ROS    Renal/GU negative Renal ROS  negative genitourinary   Musculoskeletal  (+) Arthritis ,    Abdominal   Peds  Hematology negative hematology ROS (+)   Anesthesia Other Findings Day of surgery medications reviewed with patient.  Reproductive/Obstetrics ADNEXAL MASS AND POST MENOPAUSAL BLEEDING                              Anesthesia Physical Anesthesia Plan  ASA: 2  Anesthesia Plan: General   Post-op Pain Management: Tylenol PO (pre-op)*   Induction: Intravenous  PONV Risk Score and Plan: 3 and Treatment may vary due to age or medical condition, Ondansetron, Dexamethasone and Midazolam  Airway Management Planned: Oral ETT  Additional Equipment: None  Intra-op Plan:   Post-operative Plan: Extubation in OR  Informed Consent: I have reviewed the patients History and Physical, chart, labs and discussed the procedure including the risks, benefits and alternatives for the proposed anesthesia with the patient or authorized representative who has indicated his/her understanding and acceptance.     Dental advisory given  Plan Discussed with: CRNA  Anesthesia Plan Comments:         Anesthesia Quick Evaluation

## 2022-09-30 SURGERY — HYSTERECTOMY, TOTAL, ROBOT-ASSISTED, LAPAROSCOPIC, WITH BILATERAL SALPINGO-OOPHORECTOMY
Anesthesia: General | Laterality: Bilateral

## 2022-09-30 SURGERY — HYSTERECTOMY, TOTAL, ROBOT-ASSISTED, LAPAROSCOPIC, WITH BILATERAL SALPINGO-OOPHORECTOMY
Anesthesia: General

## 2022-10-01 ENCOUNTER — Telehealth: Payer: Self-pay | Admitting: *Deleted

## 2022-10-01 ENCOUNTER — Other Ambulatory Visit: Payer: Self-pay | Admitting: Gynecologic Oncology

## 2022-10-01 ENCOUNTER — Encounter: Payer: Self-pay | Admitting: *Deleted

## 2022-10-01 DIAGNOSIS — R1084 Generalized abdominal pain: Secondary | ICD-10-CM

## 2022-10-01 MED ORDER — HYDROCODONE-ACETAMINOPHEN 5-325 MG PO TABS
1.0000 | ORAL_TABLET | Freq: Four times a day (QID) | ORAL | 0 refills | Status: DC | PRN
Start: 2022-10-01 — End: 2022-10-14

## 2022-10-01 NOTE — Telephone Encounter (Signed)
Spoke with Catherine Mclean who returned call from office. Pt states she took a tramadol last night after dinner and then couldn't sleep all night because she was itching and scratching her entire body. Pt denies rash, fever and chills. Pt states she has taken Tramadol in the past and had the same type of a reaction. Pt has taken hydrocodone and tylenol with codeine before and hasn't had a problems. Pt advised to stop taking the tramadol and take a dose of benadryl over the counter and providers would call in a different Rx. Pt verbalized understanding.

## 2022-10-01 NOTE — Progress Notes (Signed)
See RN note. Patient had reaction to tramadol. When RN called, patient reported having a reaction to this in the past so this will be discontinued and added to her allergy list. She has taken hydrocodone/APAP in the past without issue. New prescription sent.

## 2022-10-01 NOTE — Progress Notes (Addendum)
Updated date of surgery:10-12-22  Updated time of arrival:0515  Patient will be discharged from hospital and monitored at home for 24 hours by:  Sister sheila cox  Patient denies any changes in allergies, medications, medical history since pre op appointment on:  Pre op instructions reviewed, follow up questions addressed and patient verbalized understanding at this time.

## 2022-10-01 NOTE — Telephone Encounter (Signed)
Attempted to reach patient in regards to her symptoms and left voicemail requesting call back to [phone].

## 2022-10-01 NOTE — Progress Notes (Signed)
Updated date of surgery:10-12-22  Updated time of arrival:05150  Patient will be discharged from hospital and monitored at home for 24 hours by:  Patient denies any changes in allergies, medications, medical history since pre op appointment on:  Pre op instructions reviewed, follow up questions addressed and patient verbalized understanding at this time.

## 2022-10-06 ENCOUNTER — Other Ambulatory Visit: Payer: Self-pay | Admitting: Family Medicine

## 2022-10-11 ENCOUNTER — Encounter: Payer: 59 | Admitting: Psychiatry

## 2022-10-11 ENCOUNTER — Telehealth: Payer: Self-pay | Admitting: *Deleted

## 2022-10-11 NOTE — Telephone Encounter (Signed)
3rd attempt to reach patient for pre-op call. Left voicemail requesting call back.

## 2022-10-11 NOTE — Telephone Encounter (Signed)
 Attempted to reach patient for pre-op call. Left voicemail requesting call back to [phone].

## 2022-10-11 NOTE — Telephone Encounter (Signed)
2nd attempt to reach patient for pre-op call. Left voicemail requesting call back.

## 2022-10-12 ENCOUNTER — Encounter (HOSPITAL_COMMUNITY)
Admission: RE | Disposition: A | Discharge status: Home / Self Care | Payer: Self-pay | Source: Home / Self Care | Attending: Psychiatry

## 2022-10-12 ENCOUNTER — Encounter (HOSPITAL_COMMUNITY): Payer: Self-pay | Admitting: Psychiatry

## 2022-10-12 ENCOUNTER — Ambulatory Visit (HOSPITAL_COMMUNITY)
Admission: RE | Admit: 2022-10-12 | Discharge: 2022-10-12 | Disposition: A | Discharge status: Home / Self Care | Payer: 59 | Attending: Psychiatry | Admitting: Psychiatry

## 2022-10-12 ENCOUNTER — Ambulatory Visit (HOSPITAL_COMMUNITY): Payer: 59 | Admitting: Anesthesiology

## 2022-10-12 ENCOUNTER — Other Ambulatory Visit: Payer: Self-pay

## 2022-10-12 ENCOUNTER — Ambulatory Visit (HOSPITAL_BASED_OUTPATIENT_CLINIC_OR_DEPARTMENT_OTHER): Payer: 59 | Admitting: Anesthesiology

## 2022-10-12 DIAGNOSIS — D270 Benign neoplasm of right ovary: Secondary | ICD-10-CM | POA: Diagnosis not present

## 2022-10-12 DIAGNOSIS — R1900 Intra-abdominal and pelvic swelling, mass and lump, unspecified site: Secondary | ICD-10-CM | POA: Diagnosis not present

## 2022-10-12 DIAGNOSIS — D259 Leiomyoma of uterus, unspecified: Secondary | ICD-10-CM | POA: Diagnosis not present

## 2022-10-12 DIAGNOSIS — N950 Postmenopausal bleeding: Secondary | ICD-10-CM | POA: Insufficient documentation

## 2022-10-12 DIAGNOSIS — Z01818 Encounter for other preprocedural examination: Secondary | ICD-10-CM

## 2022-10-12 DIAGNOSIS — K660 Peritoneal adhesions (postprocedural) (postinfection): Secondary | ICD-10-CM | POA: Diagnosis not present

## 2022-10-12 HISTORY — PX: ROBOTIC ASSISTED TOTAL HYSTERECTOMY WITH BILATERAL SALPINGO OOPHERECTOMY: SHX6086

## 2022-10-12 LAB — TYPE AND SCREEN
ABO/RH(D): O POS
Antibody Screen: NEGATIVE

## 2022-10-12 SURGERY — HYSTERECTOMY, TOTAL, ROBOT-ASSISTED, LAPAROSCOPIC, WITH BILATERAL SALPINGO-OOPHORECTOMY
Anesthesia: General | Laterality: Bilateral

## 2022-10-12 MED ORDER — AMISULPRIDE (ANTIEMETIC) 5 MG/2ML IV SOLN
INTRAVENOUS | Status: AC
  Filled 2022-10-12: qty 4

## 2022-10-12 MED ORDER — DEXMEDETOMIDINE HCL IN NACL 200 MCG/50ML IV SOLN
INTRAVENOUS | Status: DC | PRN
  Administered 2022-10-12 (×2): 8 ug via INTRAVENOUS

## 2022-10-12 MED ORDER — HYDROMORPHONE HCL 1 MG/ML IJ SOLN
INTRAMUSCULAR | Status: AC
  Filled 2022-10-12: qty 1

## 2022-10-12 MED ORDER — LACTATED RINGERS IV SOLN: INTRAVENOUS | Status: DC | PRN

## 2022-10-12 MED ORDER — BUPIVACAINE HCL 0.25 % IJ SOLN
INTRAMUSCULAR | Status: DC | PRN
  Administered 2022-10-12: 20 mL

## 2022-10-12 MED ORDER — LIDOCAINE HCL (CARDIAC) PF 100 MG/5ML IV SOSY
PREFILLED_SYRINGE | INTRAVENOUS | Status: DC | PRN
  Administered 2022-10-12: 60 mg via INTRAVENOUS

## 2022-10-12 MED ORDER — MIDAZOLAM HCL 5 MG/5ML IJ SOLN
INTRAMUSCULAR | Status: DC | PRN
  Administered 2022-10-12: 2 mg via INTRAVENOUS

## 2022-10-12 MED ORDER — ACETAMINOPHEN 500 MG PO TABS
1000.0000 mg | ORAL_TABLET | Freq: Once | ORAL | Status: AC
  Administered 2022-10-12: 1000 mg via ORAL
  Filled 2022-10-12: qty 2

## 2022-10-12 MED ORDER — PROPOFOL 10 MG/ML IV BOLUS
INTRAVENOUS | Status: AC
  Filled 2022-10-12: qty 20

## 2022-10-12 MED ORDER — LACTATED RINGERS IR SOLN
Status: DC | PRN
  Administered 2022-10-12: 1000 mL

## 2022-10-12 MED ORDER — GABAPENTIN 300 MG PO CAPS
300.0000 mg | ORAL_CAPSULE | ORAL | Status: AC
  Administered 2022-10-12: 300 mg via ORAL
  Filled 2022-10-12: qty 1

## 2022-10-12 MED ORDER — MIDAZOLAM HCL 2 MG/2ML IJ SOLN
INTRAMUSCULAR | Status: AC
  Filled 2022-10-12: qty 2

## 2022-10-12 MED ORDER — LIDOCAINE HCL (PF) 2 % IJ SOLN
INTRAMUSCULAR | Status: DC | PRN
  Administered 2022-10-12: 1.5 mg/kg/h via INTRADERMAL

## 2022-10-12 MED ORDER — HYDROMORPHONE HCL 1 MG/ML IJ SOLN
0.2500 mg | INTRAMUSCULAR | Status: DC | PRN
  Administered 2022-10-12: 0.5 mg via INTRAVENOUS
  Administered 2022-10-12: 0.25 mg via INTRAVENOUS
  Administered 2022-10-12: 0.5 mg via INTRAVENOUS

## 2022-10-12 MED ORDER — CHLORHEXIDINE GLUCONATE 0.12 % MT SOLN
15.0000 mL | Freq: Once | OROMUCOSAL | Status: AC
  Administered 2022-10-12: 15 mL via OROMUCOSAL

## 2022-10-12 MED ORDER — PHENYLEPHRINE HCL-NACL 20-0.9 MG/250ML-% IV SOLN
INTRAVENOUS | Status: AC
  Filled 2022-10-12: qty 250

## 2022-10-12 MED ORDER — BUPIVACAINE LIPOSOME 1.3 % IJ SUSP
INTRAMUSCULAR | Status: AC
  Filled 2022-10-12: qty 20

## 2022-10-12 MED ORDER — BUPIVACAINE LIPOSOME 1.3 % IJ SUSP
INTRAMUSCULAR | Status: DC | PRN
  Administered 2022-10-12: 20 mL

## 2022-10-12 MED ORDER — HYDROMORPHONE HCL 1 MG/ML IJ SOLN
INTRAMUSCULAR | Status: AC
  Administered 2022-10-12: 0.25 mg via INTRAVENOUS
  Filled 2022-10-12: qty 1

## 2022-10-12 MED ORDER — ESMOLOL HCL 100 MG/10ML IV SOLN
INTRAVENOUS | Status: AC
  Filled 2022-10-12: qty 10

## 2022-10-12 MED ORDER — AMISULPRIDE (ANTIEMETIC) 5 MG/2ML IV SOLN
10.0000 mg | Freq: Once | INTRAVENOUS | Status: AC | PRN
  Administered 2022-10-12: 10 mg via INTRAVENOUS

## 2022-10-12 MED ORDER — FENTANYL CITRATE (PF) 100 MCG/2ML IJ SOLN
INTRAMUSCULAR | Status: AC
  Filled 2022-10-12: qty 2

## 2022-10-12 MED ORDER — PROPOFOL 1000 MG/100ML IV EMUL
INTRAVENOUS | Status: AC
  Filled 2022-10-12: qty 100

## 2022-10-12 MED ORDER — DEXAMETHASONE SODIUM PHOSPHATE 10 MG/ML IJ SOLN
INTRAMUSCULAR | Status: AC
  Filled 2022-10-12: qty 1

## 2022-10-12 MED ORDER — DEXAMETHASONE SODIUM PHOSPHATE 10 MG/ML IJ SOLN
INTRAMUSCULAR | Status: DC | PRN
  Administered 2022-10-12: 5 mg via INTRAVENOUS

## 2022-10-12 MED ORDER — ONDANSETRON HCL 4 MG/2ML IJ SOLN
INTRAMUSCULAR | Status: DC | PRN
Start: 2022-10-12 — End: 2022-10-12
  Administered 2022-10-12: 4 mg via INTRAVENOUS

## 2022-10-12 MED ORDER — STERILE WATER FOR IRRIGATION IR SOLN
Status: DC | PRN
  Administered 2022-10-12: 1000 mL

## 2022-10-12 MED ORDER — ROCURONIUM BROMIDE 10 MG/ML (PF) SYRINGE
PREFILLED_SYRINGE | INTRAVENOUS | Status: AC
  Filled 2022-10-12: qty 10

## 2022-10-12 MED ORDER — ONDANSETRON HCL 4 MG/2ML IJ SOLN
INTRAMUSCULAR | Status: AC
  Filled 2022-10-12: qty 2

## 2022-10-12 MED ORDER — SUGAMMADEX SODIUM 200 MG/2ML IV SOLN
INTRAVENOUS | Status: DC | PRN
  Administered 2022-10-12: 200 mg via INTRAVENOUS

## 2022-10-12 MED ORDER — LIDOCAINE HCL (PF) 2 % IJ SOLN
INTRAMUSCULAR | Status: AC
  Filled 2022-10-12: qty 5

## 2022-10-12 MED ORDER — CEFAZOLIN SODIUM-DEXTROSE 2-4 GM/100ML-% IV SOLN
2.0000 g | INTRAVENOUS | Status: DC
  Filled 2022-10-12: qty 100

## 2022-10-12 MED ORDER — HEPARIN SODIUM (PORCINE) 5000 UNIT/ML IJ SOLN
5000.0000 [IU] | INTRAMUSCULAR | Status: AC
  Administered 2022-10-12: 5000 [IU] via SUBCUTANEOUS
  Filled 2022-10-12: qty 1

## 2022-10-12 MED ORDER — PROPOFOL 1000 MG/100ML IV EMUL
INTRAVENOUS | Status: AC
  Filled 2022-10-12: qty 200

## 2022-10-12 MED ORDER — LACTATED RINGERS IV SOLN: INTRAVENOUS | Status: DC

## 2022-10-12 MED ORDER — KETAMINE HCL 50 MG/5ML IJ SOSY
PREFILLED_SYRINGE | INTRAMUSCULAR | Status: AC
  Filled 2022-10-12: qty 5

## 2022-10-12 MED ORDER — POVIDONE-IODINE 10 % EX SWAB: 2.0000 | Freq: Once | CUTANEOUS | Status: DC

## 2022-10-12 MED ORDER — FENTANYL CITRATE (PF) 100 MCG/2ML IJ SOLN
INTRAMUSCULAR | Status: DC | PRN
  Administered 2022-10-12 (×6): 50 ug via INTRAVENOUS

## 2022-10-12 MED ORDER — ROCURONIUM BROMIDE 100 MG/10ML IV SOLN
INTRAVENOUS | Status: DC | PRN
  Administered 2022-10-12: 10 mg via INTRAVENOUS
  Administered 2022-10-12: 20 mg via INTRAVENOUS
  Administered 2022-10-12: 60 mg via INTRAVENOUS

## 2022-10-12 MED ORDER — ESMOLOL HCL 100 MG/10ML IV SOLN
INTRAVENOUS | Status: DC | PRN
  Administered 2022-10-12: 15 mg via INTRAVENOUS

## 2022-10-12 MED ORDER — CEFAZOLIN SODIUM-DEXTROSE 2-3 GM-%(50ML) IV SOLR
INTRAVENOUS | Status: DC | PRN
  Administered 2022-10-12: 2 g via INTRAVENOUS

## 2022-10-12 MED ORDER — BUPIVACAINE HCL 0.25 % IJ SOLN
INTRAMUSCULAR | Status: AC
  Filled 2022-10-12: qty 1

## 2022-10-12 MED ORDER — PROPOFOL 10 MG/ML IV BOLUS
INTRAVENOUS | Status: DC | PRN
  Administered 2022-10-12: 120 mg via INTRAVENOUS

## 2022-10-12 SURGICAL SUPPLY — 85 items
ADH SKN CLS APL DERMABOND .7 (GAUZE/BANDAGES/DRESSINGS) ×1
AGENT HMST KT MTR STRL THRMB (HEMOSTASIS)
APL ESCP 34 STRL LF DISP (HEMOSTASIS)
APPLICATOR SURGIFLO ENDO (HEMOSTASIS) IMPLANT
BAG LAPAROSCOPIC 12 15 PORT 16 (BASKET) IMPLANT
BAG RETRIEVAL 12/15 (BASKET) ×1
BLADE SURG SZ10 CARB STEEL (BLADE) IMPLANT
COVER BACK TABLE 60X90IN (DRAPES) ×1 IMPLANT
COVER TIP SHEARS 8 DVNC (MISCELLANEOUS) ×1 IMPLANT
DERMABOND ADVANCED .7 DNX12 (GAUZE/BANDAGES/DRESSINGS) ×1 IMPLANT
DRAPE ARM DVNC X/XI (DISPOSABLE) ×4 IMPLANT
DRAPE COLUMN DVNC XI (DISPOSABLE) ×1 IMPLANT
DRAPE SHEET LG 3/4 BI-LAMINATE (DRAPES) ×1 IMPLANT
DRAPE SURG IRRIG POUCH 19X23 (DRAPES) ×1 IMPLANT
DRIVER NDL MEGA SUTCUT DVNCXI (INSTRUMENTS) ×1 IMPLANT
DRIVER NDLE MEGA SUTCUT DVNCXI (INSTRUMENTS) ×1
DRSG OPSITE POSTOP 4X6 (GAUZE/BANDAGES/DRESSINGS) IMPLANT
DRSG OPSITE POSTOP 4X8 (GAUZE/BANDAGES/DRESSINGS) IMPLANT
DRSG TEGADERM 6X8 (GAUZE/BANDAGES/DRESSINGS) IMPLANT
DRSG TEGADERM 8X12 (GAUZE/BANDAGES/DRESSINGS) IMPLANT
ELECT PENCIL ROCKER SW 15FT (MISCELLANEOUS) IMPLANT
ELECT REM PT RETURN 15FT ADLT (MISCELLANEOUS) ×1 IMPLANT
FORCEPS BPLR FENES DVNC XI (FORCEP) ×1 IMPLANT
FORCEPS PROGRASP DVNC XI (FORCEP) ×1 IMPLANT
GAUZE 4X4 16PLY ~~LOC~~+RFID DBL (SPONGE) ×1 IMPLANT
GLOVE BIO SURGEON STRL SZ 6 (GLOVE) ×4 IMPLANT
GLOVE BIO SURGEON STRL SZ 6.5 (GLOVE) ×1 IMPLANT
GLOVE BIOGEL PI IND STRL 6.5 (GLOVE) ×2 IMPLANT
GOWN STRL REUS W/ TWL LRG LVL3 (GOWN DISPOSABLE) ×4 IMPLANT
GOWN STRL REUS W/TWL LRG LVL3 (GOWN DISPOSABLE) ×4
GRASPER SUT TROCAR 14GX15 (MISCELLANEOUS) IMPLANT
HOLDER FOLEY CATH W/STRAP (MISCELLANEOUS) IMPLANT
IRRIG SUCT STRYKERFLOW 2 WTIP (MISCELLANEOUS) ×1
IRRIGATION SUCT STRKRFLW 2 WTP (MISCELLANEOUS) ×1 IMPLANT
KIT PROCEDURE DVNC SI (MISCELLANEOUS) IMPLANT
KIT TURNOVER KIT A (KITS) IMPLANT
LIGASURE IMPACT 36 18CM CVD LR (INSTRUMENTS) IMPLANT
MANIPULATOR ADVINCU DEL 3.0 PL (MISCELLANEOUS) IMPLANT
MANIPULATOR ADVINCU DEL 3.5 PL (MISCELLANEOUS) IMPLANT
MANIPULATOR UTERINE 4.5 ZUMI (MISCELLANEOUS) IMPLANT
NDL HYPO 21X1.5 SAFETY (NEEDLE) ×1 IMPLANT
NDL INSUFFLATION 14GA 120MM (NEEDLE) IMPLANT
NDL SPNL 20GX3.5 QUINCKE YW (NEEDLE) IMPLANT
NEEDLE HYPO 21X1.5 SAFETY (NEEDLE) ×1
NEEDLE INSUFFLATION 14GA 120MM (NEEDLE)
NEEDLE SPNL 20GX3.5 QUINCKE YW (NEEDLE)
OBTURATOR OPTICAL STND 8 DVNC (TROCAR) ×1
OBTURATOR OPTICALSTD 8 DVNC (TROCAR) ×1 IMPLANT
PACK ROBOT GYN CUSTOM WL (TRAY / TRAY PROCEDURE) ×1 IMPLANT
PAD ARMBOARD 7.5X6 YLW CONV (MISCELLANEOUS) ×1 IMPLANT
PAD POSITIONING PINK XL (MISCELLANEOUS) ×1 IMPLANT
PORT ACCESS TROCAR AIRSEAL 12 (TROCAR) IMPLANT
SCISSORS LAP 5X45 EPIX DISP (ENDOMECHANICALS) IMPLANT
SCISSORS MNPLR CVD DVNC XI (INSTRUMENTS) ×1 IMPLANT
SCRUB CHG 4% DYNA-HEX 4OZ (MISCELLANEOUS) ×2 IMPLANT
SEAL UNIV 5-12 XI (MISCELLANEOUS) ×4 IMPLANT
SET TRI-LUMEN FLTR TB AIRSEAL (TUBING) ×1 IMPLANT
SPIKE FLUID TRANSFER (MISCELLANEOUS) ×1 IMPLANT
SPONGE T-LAP 18X18 ~~LOC~~+RFID (SPONGE) IMPLANT
SURGIFLO W/THROMBIN 8M KIT (HEMOSTASIS) IMPLANT
SUT MNCRL AB 4-0 PS2 18 (SUTURE) IMPLANT
SUT PDS AB 1 TP1 54 (SUTURE) IMPLANT
SUT VIC AB 0 CT1 27 (SUTURE)
SUT VIC AB 0 CT1 27XBRD ANTBC (SUTURE) IMPLANT
SUT VIC AB 2-0 CT1 27 (SUTURE)
SUT VIC AB 2-0 CT1 TAPERPNT 27 (SUTURE) IMPLANT
SUT VIC AB 2-0 SH 27 (SUTURE) ×2
SUT VIC AB 2-0 SH 27X BRD (SUTURE) IMPLANT
SUT VIC AB 4-0 PS2 18 (SUTURE) ×2 IMPLANT
SUT VICRYL 0 27 CT2 27 ABS (SUTURE) IMPLANT
SUT VLOC 180 0 9IN GS21 (SUTURE) IMPLANT
SYR 10ML LL (SYRINGE) IMPLANT
SYS BAG RETRIEVAL 10MM (BASKET)
SYS RETRIEVAL 5MM INZII UNIV (BASKET) ×1
SYS WOUND ALEXIS 18CM MED (MISCELLANEOUS) ×1
SYSTEM BAG RETRIEVAL 10MM (BASKET) IMPLANT
SYSTEM RETRIEVL 5MM INZII UNIV (BASKET) IMPLANT
SYSTEM WOUND ALEXIS 18CM MED (MISCELLANEOUS) IMPLANT
TOWEL OR NON WOVEN STRL DISP B (DISPOSABLE) IMPLANT
TRAP SPECIMEN MUCUS 40CC (MISCELLANEOUS) IMPLANT
TRAY FOLEY MTR SLVR 16FR STAT (SET/KITS/TRAYS/PACK) ×1 IMPLANT
TROCAR PORT AIRSEAL 5X120 (TROCAR) IMPLANT
UNDERPAD 30X36 HEAVY ABSORB (UNDERPADS AND DIAPERS) ×2 IMPLANT
WATER STERILE IRR 1000ML POUR (IV SOLUTION) ×1 IMPLANT
YANKAUER SUCT BULB TIP 10FT TU (MISCELLANEOUS) IMPLANT

## 2022-10-12 NOTE — Brief Op Note (Signed)
10/12/2022  11:24 AM  PATIENT:  Catherine Mclean  62 y.o. female  PRE-OPERATIVE DIAGNOSIS:  ADNEXAL MASS AND POST MENOPAUSAL BLEEDING  POST-OPERATIVE DIAGNOSIS:  Same, uterine fibroids, right ovarian mass Darnelle Bos tumor), omental adhesions  PROCEDURE:  Procedure(s): MINI LAPAROTOMY FOR CYST DRAINAGE, XI ROBOTIC ASSISTED RIGHT SALPINGO- OOPHORECTOMY WITH ROBOTIC TOTAL HYSTERECTOMY,PARTIAL OMENTECTOMY (Bilateral)  SURGEON:  Surgeons and Role:    Clide Cliff, MD - Primary    * Antionette Char, MD - Assisting  ANESTHESIA:   general  EBL:  200 mL   BLOOD ADMINISTERED:none  DRAINS: none   LOCAL MEDICATIONS USED:  BUPIVICAINE  and OTHER Exparel  SPECIMEN:   ID Type Source Tests Collected by Time Destination  1 : RIght tube and ovary Tissue PATH Gyn tumor resection SURGICAL PATHOLOGY Clide Cliff, MD 10/12/2022 [phone]   2 : Uterus and cervix Tissue PATH Gyn tumor resection SURGICAL PATHOLOGY Clide Cliff, MD 10/12/2022 [phone]   3 : Omentum Tissue PATH GI tumor resection SURGICAL PATHOLOGY Clide Cliff, MD 10/12/2022 [phone]   A : Pelvic washings Body Fluid PATH Cytology Pelvic Washing CYTOLOGY - NON PAP Clide Cliff, MD 10/12/2022 [phone]      DISPOSITION OF SPECIMEN:  PATHOLOGY  COUNTS:  YES  TOURNIQUET:  * No tourniquets in log *  DICTATION: .Note written in EPIC  PLAN OF CARE: Discharge to home after PACU  PATIENT DISPOSITION:  PACU - hemodynamically stable.   Delay start of Pharmacological VTE agent (>24hrs) due to surgical blood loss or risk of bleeding: not applicable

## 2022-10-12 NOTE — Anesthesia Postprocedure Evaluation (Signed)
Anesthesia Post Note  Patient: Catherine Mclean  Procedure(s) Performed: MINI LAPAROTOMY FOR CYST DRAINAGE, XI ROBOTIC ASSISTED RIGHT SALPINGO- OOPHORECTOMY WITH ROBOTIC TOTAL HYSTERECTOMY,PARTIAL OMENTECTOMY (Bilateral)     Patient location during evaluation: PACU Anesthesia Type: General Level of consciousness: awake and alert Pain management: pain level controlled Vital Signs Assessment: post-procedure vital signs reviewed and stable Respiratory status: spontaneous breathing, nonlabored ventilation and respiratory function stable Cardiovascular status: blood pressure returned to baseline Postop Assessment: no apparent nausea or vomiting Anesthetic complications: no   No notable events documented.  Last Vitals:  Vitals:   10/12/22 1245 10/12/22 1300  BP: (!) 115/56 (!) 124/54  Pulse: 66   Resp: 18 12  Temp:  (!) 36.4 C  SpO2: 93%     Last Pain:  Vitals:   10/12/22 1245  TempSrc:   PainSc: 3                  Shanda Howells

## 2022-10-12 NOTE — H&P (Signed)
Brief Pre-operative History & Physical  Patient name: Catherine Mclean CSN: [id number] MRN: [id number] Admit Date: 10/12/2022 Date of Surgery: 10/12/2022 Performing Service: Gynecology   Code Status: Prior    Assessment & Plan    Catherine Mclean is a 62 y.o. female with ADNEXAL MASS AND POST MENOPAUSAL BLEEDING, who presents for: Procedure(s) (LRB): MINI LAPAROTOMY FOR CYST DRAINAGE, XI ROBOTIC ASSISTED BILATERAL SALPINGO- OOPHORECTOMY WITH ROBOTIC TOTAL HYSTERECTOMY WITH POSSIBLE STAGING (Bilateral).   Consent obtained in office is accurate. Risks, benefits, and alternatives to surgery were reviewed, and all questions were answered.  Proceed to the OR as planned.     History of Present Illness:  Catherine Mclean is a 61 y.o. female with ADNEXAL MASS AND POST MENOPAUSAL BLEEDING. She was recently seen in clinic, where a detailed HPI can be found. She was noted to benefit from: Procedure(s) (LRB): MINI LAPAROTOMY FOR CYST DRAINAGE, XI ROBOTIC ASSISTED BILATERAL SALPINGO- OOPHORECTOMY WITH ROBOTIC TOTAL HYSTERECTOMY WITH POSSIBLE STAGING (Bilateral).    Allergies Hydrochlorothiazide w-triamterene, Spironolactone, and Tramadol  Medications   Current Facility-Administered Medications  Medication Dose Route Frequency Provider Last Rate Last Admin   ceFAZolin (ANCEF) IVPB 2g/100 mL premix  2 g Intravenous On Call to OR Clide Cliff, MD       gabapentin (NEURONTIN) capsule 300 mg  300 mg Oral On Call to OR Clide Cliff, MD       heparin injection 5,000 Units  5,000 Units Subcutaneous 120 min pre-op Clide Cliff, MD       lactated ringers infusion   Intravenous Continuous Kaylyn Layer, MD 10 mL/hr at 10/12/22 0646 New Bag at 10/12/22 0646   povidone-iodine 10 % swab 2 Application  2 Application Topical Once Clide Cliff, MD        Vital Signs BP (!) 152/73   Pulse (!) 51   Temp 97.8 F (36.6 C) (Oral)   Resp 18   Ht 5' 2.5" (1.588 m)   Wt 138 lb 14.2 oz (63 kg)   LMP  05/10/2016   SpO2 100%   BMI 25.00 kg/m  Facility age limit for growth %iles is 20 years. Facility age limit for growth %iles is 20 years..   Physical Exam General: Well developed, appears stated age, in no acute distress  Mental status: Alert and oriented x3 Cardiovascular: Normal Pulmonary: Symmetric chest rise, unlabored breathing Relevant System for Surgery: Surgical site examination deferred to the OR   Labs and Studies: Lab Results  Component Value Date   WBC 7.7 09/21/2022   HGB 13.1 09/21/2022   HCT 41.1 09/21/2022   PLT 223 09/21/2022    No results found for: "INR", "APTT" \

## 2022-10-12 NOTE — Anesthesia Procedure Notes (Signed)
Procedure Name: Intubation Date/Time: 10/12/2022 7:45 AM  Performed by: Johnette Abraham, CRNAPre-anesthesia Checklist: Patient identified, Emergency Drugs available, Suction available and Patient being monitored Patient Re-evaluated:Patient Re-evaluated prior to induction Oxygen Delivery Method: Circle System Utilized Preoxygenation: Pre-oxygenation with 100% oxygen Induction Type: IV induction Ventilation: Mask ventilation without difficulty Laryngoscope Size: Mac and 3 Grade View: Grade II Tube type: Oral Tube size: 7.0 mm Number of attempts: 1 Airway Equipment and Method: Stylet and Oral airway Placement Confirmation: ETT inserted through vocal cords under direct vision, positive ETCO2 and breath sounds checked- equal and bilateral Secured at: 22 cm Tube secured with: Tape Dental Injury: Teeth and Oropharynx as per pre-operative assessment

## 2022-10-12 NOTE — Discharge Instructions (Addendum)
AFTER SURGERY INSTRUCTIONS   Return to work: 4-6 weeks if applicable  You may have a white honeycomb dressing over your larger incision. This dressing can be removed 5 days after surgery and you do not need to reapply a new dressing. Once you remove the dressing, you will notice that you have the surgical glue (dermabond) on the incision and this will peel off on its own. You can get this dressing wet in the shower the days after surgery prior to removal on the 5th day.    Activity: 1. Be up and out of the bed during the day.  Take a nap if needed.  You may walk up steps but be careful and use the hand rail.  Stair climbing will tire you more than you think, you may need to stop part way and rest.    2. No lifting or straining for 6 weeks over 10 pounds. No pushing, pulling, straining for 6 weeks.   3. No driving for around 1 week(s).  Do not drive if you are taking narcotic pain medicine and make sure that your reaction time has returned.    4. You can shower as soon as the next day after surgery. Shower daily.  Use your regular soap and water (not directly on the incision) and pat your incision(s) dry afterwards; don't rub.  No tub baths or submerging your body in water until cleared by your surgeon. If you have the soap that was given to you by pre-surgical testing that was used before surgery, you do not need to use it afterwards because this can irritate your incisions.    5. No sexual activity and nothing in the vagina for 12 weeks.   6. You may experience a small amount of clear drainage from your incisions, which is normal.  If the drainage persists, increases, or changes color please call the office.   7. Do not use creams, lotions, or ointments such as neosporin on your incisions after surgery until advised by your surgeon because they can cause removal of the dermabond glue on your incisions.     8. You may experience vaginal spotting after surgery or when the stitches at the top of  the vagina begin to dissolve.  The spotting is normal but if you experience heavy bleeding, call our office.   9. Take Tylenol or ibuprofen first for pain if you are able to take these medications and only use narcotic pain medication for severe pain not relieved by the Tylenol or Ibuprofen.  Monitor your Tylenol intake to a max of 4,000 mg in a 24 hour period. You can alternate these medications after surgery.   Diet: 1. Low sodium Heart Healthy Diet is recommended but you are cleared to resume your normal (before surgery) diet after your procedure.   2. It is safe to use a laxative, such as Miralax or Colace, if you have difficulty moving your bowels. You have been prescribed Sennakot-S to take at bedtime every evening after surgery to keep bowel movements regular and to prevent constipation.     Wound Care: 1. Keep clean and dry.  Shower daily.   Reasons to call the Doctor: Fever - Oral temperature greater than 100.4 degrees Fahrenheit Foul-smelling vaginal discharge Difficulty urinating Nausea and vomiting Increased pain at the site of the incision that is unrelieved with pain medicine. Difficulty breathing with or without chest pain New calf pain especially if only on one side Sudden, continuing increased vaginal bleeding with or without  clots.   Contacts: For questions or concerns you should contact:   Dr. Clide Cliff at [phone]   Warner Mccreedy, NP at [phone]   After Hours: call [phone] and have the GYN Oncologist paged/contacted (after 5 pm or on the weekends). You will speak with an after hours RN and let he or she know you have had surgery.   Messages sent via mychart are for non-urgent matters and are not responded to after hours so for urgent needs, please call the after hours number.

## 2022-10-12 NOTE — Transfer of Care (Signed)
Immediate Anesthesia Transfer of Care Note  Patient: Catherine Mclean  Procedure(s) Performed: MINI LAPAROTOMY FOR CYST DRAINAGE, XI ROBOTIC ASSISTED RIGHT SALPINGO- OOPHORECTOMY WITH ROBOTIC TOTAL HYSTERECTOMY,PARTIAL OMENTECTOMY (Bilateral)  Patient Location: PACU  Anesthesia Type:General  Level of Consciousness: awake, alert , oriented, and patient cooperative  Airway & Oxygen Therapy: Patient Spontanous Breathing and Patient connected to face mask oxygen  Post-op Assessment: Report given to RN and Post -op Vital signs reviewed and stable  Post vital signs: Reviewed and stable  Last Vitals:  Vitals Value Taken Time  BP    Temp    Pulse    Resp    SpO2      Last Pain:  Vitals:   10/12/22 0634  TempSrc:   PainSc: 0-No pain         Complications: No notable events documented.

## 2022-10-12 NOTE — Op Note (Signed)
GYNECOLOGIC ONCOLOGY OPERATIVE NOTE  Date of Service: 10/12/2022  Preoperative Diagnosis: Adnexal mass, postmenopausal bleeding  Postoperative Diagnosis: Right ovarian mass Darnelle Bos tumor), postmenopausal bleeding, uterine fibroids, omental adhesions  Procedures: Mini laparotomy for controlled cyst drainage, robotic assisted total laparoscopic hysterectomy, right salpingo-oophorectomy, lysis of adhesions, infracolic omentectomy (Modifer 22: size of ovarian mass (23cm) and adhesions of the bowel and omentum to the anterior abdominal wall and anterior pelvic wall increasing the complexity of the case and necessitating additional instrumentation for retraction and to create safe exposure. Increased the duration of the procedure by 60 minutes.)   Surgeon: Clide Cliff, MD  Assistants: Antionette Char, MD and (an MD assistant was necessary for tissue manipulation, management of robotic instrumentation, retraction and positioning due to the complexity of the case and hospital policies)  Anesthesia: General  Estimated Blood Loss: 200 mL    Fluids: 1900 ml, crystalloid  Urine Output: 450 ml, clear yellow  Findings: On bimanual and abdominal exam, large abdominopelvic mass that fills the abdomen to several centimeters above the umbilicus, partially mobile.  On mini laparotomy, evidence of adhesions of the omentum to the anterior abdominal wall.  Pelvic mass with smooth surface.  Control drainage of cyst with brown fluid drainage.  On laparoscopic entry to the abdomen, normal upper abdominal survey with smooth diaphragm, liver, stomach.  Omentum adherent to the anterior abdominal wall from the midline to the left abdomen and inferior to the bladder.  Additionally omentum adherent to the left pelvic sidewall and left adnexal region.  Transverse colon elevated towards the anterior abdominal wall due to omental adhesions.  Sigmoid colon adherent to the left pelvic sidewall.  Uterus mildly enlarged with  multiple small intramural and subserosal fibroids.  Right ovarian mass, predominantly multi cystic but with some solid components.  Surgically absent left fallopian tube and ovary. 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  Specimens:  ID Type Source Tests Collected by Time Destination  1 : RIght tube and ovary Tissue PATH Gyn tumor resection SURGICAL PATHOLOGY Clide Cliff, MD 10/12/2022 [phone]   2 : Uterus and cervix Tissue PATH Gyn tumor resection SURGICAL PATHOLOGY Clide Cliff, MD 10/12/2022 [phone]   3 : Omentum Tissue PATH GI tumor resection SURGICAL PATHOLOGY Clide Cliff, MD 10/12/2022 [phone]   A : Pelvic washings Body Fluid PATH Cytology Pelvic Washing CYTOLOGY - NON PAP Clide Cliff, MD 10/12/2022 [phone]     Complications:  None  Indications for Procedure: Catherine Mclean is a 62 y.o. woman who a 22.6 cm complex right adnexal mass and postmenopausal bleeding.  Prior to the procedure, all risks, benefits, and alternatives were discussed and informed surgical consent was signed.  Procedure: Patient was taken to the operating room where general anesthesia was achieved.  She was positioned in dorsal lithotomy and prepped and draped.  A foley catheter was inserted into the bladder. The cervix was dilated and an Advincula uterine manipulator with a colpotomy ring was inserted into the uterus.   A 5 cm vertical midline supraumbilical incision was made with the scalpel and the abdomen was entered sharply.  Omental adhesions were encountered on entry to the abdomen.  Few of these adhesions were dissected bluntly to expose the smooth surface of the adnexal mass.  Washings were obtained.  A pursestring stitch was placed in the mass with 2-0 Vicryl.  Using a gallbladder trocar, a large component of the cyst was drained in a controlled fashion.  The pursestring stitch was secured with no leakage from the mass.  Additional adhesions of the omentum to the end anterior abdominal wall in the midline  and to the left abdominal wall were lysed sharply with Metzenbaum scissors.  The transverse colon was mobilized off of the anterior abdominal wall with dissection of these omental adhesions.  A portion of omentum adherent to the midline abdominal wall inferior to the umbilicus was isolated, clamped, transected, and tied in order to clear the abdominal wall for placement of the Alexis retractor cap system.  This was placed and a robotic trocar was introduced.  The abdomen was insufflated with additional findings as noted above.  Additional trocars were placed as follows: A 5 mm air seal trocar in the left upper quadrant at Palmer's point, two 8 mm robotic trocars in the right abdomen.  At this time, using laparoscopic scissors, additional adhesions of the omentum to the left abdominal wall were lysed sharply and with short burst of cautery.  An additional 8 mm robotic trocar was placed in the left abdomen. All trocars were placed under direct visualization.  The bowels were moved into the upper abdomen.  The DaVinci robotic surgical system was brought to the patient's bedside and docked.  Additional adhesions of the omentum to the lower abdominal wall, the anterior pelvic wall, the bladder, and the left pelvic sidewall and adnexa were lysed bluntly and sharply with monopolar scissors and bipolar cautery.  With the omentum now detached from the abdominal wall, multiple portions of the omentum were noted to be somewhat devascularized and oozing.  Given this an infracolic omentectomy was performed. Pedicles were made in the omentum inferior to the transverse colon.  These were cauterized with bipolar and transected with monopolar scissors.  Hemostasis was achieved.  The right round ligament was transected and the retroperitoneum entered.  The right ureter was identified. The right infundibulopelvic ligament was isolated, cauterized, and transected.  The anterior peritoneum was opened and the bladder flap was  created.  Due to the size of the right adnexal mass, decision made to amputate the right adnexa from the uterus for better optimization of the right side of the uterus.  The proximal fallopian tube and utero-ovarian ligament were isolated, cauterized, and transected, detaching the right adnexa from the uterus.  At this time, the posterior peritoneum was opened to the KOH ring.  The right uterine artery was skeletonized, cauterized, and transected at the level of the KOH ring.  Additional cautery was used in a C-shaped fashion to allow the remainder of the broad, cardinal, and uterosacral ligaments with uterine vessels to be transected and Foley from the ring.  On the left, the round ligament was transected and the retroperitoneum entered.  The left ureter was identified.  The remnant left infundibulopelvic ligament was isolated, cauterized, and transected.  The left tube and ovary were surgically absent.  The posterior peritoneum was opened to the ring.  The anterior peritoneum was opened and the bladder flap completed.  The left uterine artery was skeletonized, cauterized, and transected at the level of the ring.  Additional cautery was used in a C-shaped fashion to allow the remainder of the broad, cardinal, uterosacral ligaments with the uterine vessels to be transected and follow a for the ring. A colpotomy was made circumferentially following the contours of the KOH ring.  The uterine specimen was removed through the vagina.  An attempt was made to place the right adnexa in a 15 cm Endo Catch bag, however, due to some cystic portions still fluid-filled, the mass could not  fit.  The vaginal cuff was closed with a running stitch of 0 Vicryl suture.  The pelvis was irrigated and all operative sites were found to be hemostatic.  All instruments were removed and the robot was taken from the patient's bedside.  The ovarian mass was grasped with a laparoscopic instrument.  The cap on the Jon Gills was removed and the  mass was brought up through the supraumbilical incision.  The mass was grasped with a Kocher.  An additional cystic portion was identified.  A pursestring stitch was placed with 2-0 Vicryl.  Using the gallbladder trocar, and while under laparoscopic guidance, and an additional portion of the cystic mass was drained in a controlled fashion.  The pursestring stitch was secured.  The mass was then able to be mobilized out of the incision.  Given that frozen pathology returned benign, we proceeded with closure.  All trocars were removed.  The fascia at the supraumbilical incision was closed with a running stitch#1 PDS. Exparel was injected at the incision sites in standard fashion. The subcutaneous tissues were irrigated and hemostasis achieved. The subcutaneous space was approximated with 2-0 Vicryl in a running fashion.  A deep dermal stitch was placed with 4-0 Vicryl. The skin was closed with 4-0 monocryl in a subcuticular fashion followed by surgical glue. The skin at the laparoscopic incisions was closed with 4-0 Vicryl to reapproximate the subcutaneous tissue and 4-0 monocryl in a subcuticular fashion followed by surgical glue.  Patient tolerated the procedure well. Sponge, lap, and instrument counts were correct.  Patient received 2 gm of Ancef prior to skin incision for routine perioperative antibiotic prophylaxis.  She was extubated and taken to the PACU in stable condition.  Clide Cliff, MD Gynecologic Oncology

## 2022-10-13 ENCOUNTER — Encounter (HOSPITAL_COMMUNITY): Payer: Self-pay | Admitting: Psychiatry

## 2022-10-13 ENCOUNTER — Telehealth: Payer: Self-pay | Admitting: *Deleted

## 2022-10-13 LAB — SURGICAL PATHOLOGY

## 2022-10-13 NOTE — Telephone Encounter (Signed)
Spoke with [name] this morning. She states she is eating, drinking and urinating well. She has not had a BM yet but is passing gas. She is taking senokot as prescribed and encouraged her to drink plenty of water. She denies fever or chills. Incisions are dry and intact. She rates her pain 5/10. Her pain is controlled with hydrocodone.  Instructed to call office with any fever, chills, purulent drainage, uncontrolled pain or any other questions or concerns. Patient verbalizes understanding.   Pt aware of post op appointments as well as the office number [phone] and after hours number [phone] to call if she has any questions or concerns

## 2022-10-13 NOTE — Telephone Encounter (Signed)
Attempted to reach patient for post-op call, left voicemail requesting call back to [phone].

## 2022-10-14 ENCOUNTER — Other Ambulatory Visit: Payer: Self-pay | Admitting: Gynecologic Oncology

## 2022-10-14 ENCOUNTER — Encounter: Payer: Self-pay | Admitting: Family Medicine

## 2022-10-14 ENCOUNTER — Telehealth: Payer: Self-pay

## 2022-10-14 DIAGNOSIS — R1084 Generalized abdominal pain: Secondary | ICD-10-CM

## 2022-10-14 LAB — CYTOLOGY - NON PAP

## 2022-10-14 MED ORDER — HYDROCODONE-ACETAMINOPHEN 5-325 MG PO TABS
1.0000 | ORAL_TABLET | Freq: Four times a day (QID) | ORAL | 0 refills | Status: DC | PRN
Start: 2022-10-14 — End: 2022-12-06

## 2022-10-14 NOTE — Telephone Encounter (Signed)
S/P Mini laparotomy and controlled cyst drainage, robotic assisted bilateral salpingo-oophorectomy, possible hysterectomy, possible staging on 10/12/22  Pt called office stating she is in a lot of pain 9/10 on pain scale. Not eating or drinking much, urinating ok, no fever/chills. She did take a shower last night but had to have help. She has not had a BM but is passing gas. Incisions dry and intact. She has been taking Senna as directed and started Miralax.  Pt would like a refill on the Hydrocodone-Acetaminophen.

## 2022-10-14 NOTE — Progress Notes (Signed)
Refill sent in per pt request.

## 2022-10-14 NOTE — Telephone Encounter (Signed)
Patient and her sister Catherine Mclean are aware of Rx being sent to pharmacy

## 2022-10-15 ENCOUNTER — Emergency Department (HOSPITAL_COMMUNITY): Payer: 59

## 2022-10-15 ENCOUNTER — Encounter (HOSPITAL_COMMUNITY): Payer: Self-pay

## 2022-10-15 ENCOUNTER — Emergency Department (HOSPITAL_COMMUNITY): Admission: EM | Admit: 2022-10-15 | Payer: 59 | Source: Home / Self Care

## 2022-10-15 ENCOUNTER — Other Ambulatory Visit: Payer: Self-pay

## 2022-10-15 DIAGNOSIS — D72829 Elevated white blood cell count, unspecified: Secondary | ICD-10-CM | POA: Insufficient documentation

## 2022-10-15 DIAGNOSIS — R109 Unspecified abdominal pain: Secondary | ICD-10-CM | POA: Diagnosis present

## 2022-10-15 DIAGNOSIS — K56699 Other intestinal obstruction unspecified as to partial versus complete obstruction: Secondary | ICD-10-CM | POA: Insufficient documentation

## 2022-10-15 DIAGNOSIS — K56609 Unspecified intestinal obstruction, unspecified as to partial versus complete obstruction: Secondary | ICD-10-CM

## 2022-10-15 LAB — COMPREHENSIVE METABOLIC PANEL
ALT: 16 U/L (ref 0–44)
AST: 30 U/L (ref 15–41)
Albumin: 3.3 g/dL — ABNORMAL LOW (ref 3.5–5.0)
Alkaline Phosphatase: 77 U/L (ref 38–126)
Anion gap: 11 (ref 5–15)
BUN: 13 mg/dL (ref 8–23)
CO2: 25 mmol/L (ref 22–32)
Calcium: 9.4 mg/dL (ref 8.9–10.3)
Chloride: 100 mmol/L (ref 98–111)
Creatinine, Ser: 0.74 mg/dL (ref 0.44–1.00)
GFR, Estimated: >60 mL/min (ref >60)
Glucose, Bld: 123 mg/dL — ABNORMAL HIGH (ref 70–99)
Potassium: 3.5 mmol/L (ref 3.5–5.1)
Sodium: 136 mmol/L (ref 135–145)
Total Bilirubin: 0.9 mg/dL (ref 0.3–1.2)
Total Protein: 7.3 g/dL (ref 6.5–8.1)

## 2022-10-15 LAB — CBC WITH DIFFERENTIAL/PLATELET
Abs Immature Granulocytes: 0.04 10*3/uL (ref 0.00–0.07)
Basophils Absolute: 0 10*3/uL (ref 0.0–0.1)
Basophils Relative: 0 %
Eosinophils Absolute: 0 10*3/uL (ref 0.0–0.5)
Eosinophils Relative: 0 %
HCT: 40.1 % (ref 36.0–46.0)
Hemoglobin: 13.5 g/dL (ref 12.0–15.0)
Immature Granulocytes: 0 %
Lymphocytes Relative: 14 %
Lymphs Abs: 1.7 10*3/uL (ref 0.7–4.0)
MCH: 27.3 pg (ref 26.0–34.0)
MCHC: 33.7 g/dL (ref 30.0–36.0)
MCV: 81.2 fL (ref 80.0–100.0)
Monocytes Absolute: 0.5 10*3/uL (ref 0.1–1.0)
Monocytes Relative: 4 %
Neutro Abs: 9.8 10*3/uL — ABNORMAL HIGH (ref 1.7–7.7)
Neutrophils Relative %: 82 %
Platelets: 235 10*3/uL (ref 150–400)
RBC: 4.94 MIL/uL (ref 3.87–5.11)
RDW: 14.4 % (ref 11.5–15.5)
WBC: 12 10*3/uL — ABNORMAL HIGH (ref 4.0–10.5)
nRBC: 0 % (ref 0.0–0.2)

## 2022-10-15 MED ORDER — IOHEXOL 300 MG/ML  SOLN
100.0000 mL | Freq: Once | INTRAMUSCULAR | Status: AC | PRN
  Administered 2022-10-15: 100 mL via INTRAVENOUS

## 2022-10-15 MED ORDER — HYDROMORPHONE HCL 1 MG/ML IJ SOLN
0.5000 mg | Freq: Once | INTRAMUSCULAR | Status: AC
  Administered 2022-10-15: 0.5 mg via INTRAVENOUS
  Filled 2022-10-15: qty 0.5

## 2022-10-15 MED ORDER — SODIUM CHLORIDE 0.9 % IV BOLUS
500.0000 mL | Freq: Once | INTRAVENOUS | Status: AC
  Administered 2022-10-15: 500 mL via INTRAVENOUS

## 2022-10-15 MED ORDER — ONDANSETRON HCL 4 MG/2ML IJ SOLN
4.0000 mg | Freq: Once | INTRAMUSCULAR | Status: AC
  Administered 2022-10-15: 4 mg via INTRAVENOUS
  Filled 2022-10-15: qty 2

## 2022-10-15 NOTE — ED Provider Notes (Signed)
[place] EMERGENCY DEPARTMENT AT [facility] Provider Note   CSN: [id number] Arrival date & time: 10/15/22  2114     History  Chief Complaint  Patient presents with   Abdominal Pain    Catherine Mclean is a 62 y.o. female.   Abdominal Pain Patient with hysterectomy done at [facility] on Tuesday.  Done for adnexal mass.  In sounds as if it was uncomplicated.  Had been doing well at home otherwise not passed gas.  Now developed nausea vomiting abdominal pain.  States abdomen is less swollen than it was previously but is hurting more.  States she has not passed gas and not having bowel movements either.  Has been taking pain medicine as needed.     Home Medications Prior to Admission medications   Medication Sig Start Date End Date Taking? Authorizing Provider  acetaminophen (TYLENOL) 650 MG CR tablet Take 650 mg by mouth every 8 (eight) hours as needed for pain.    [provider]  diclofenac Sodium (VOLTAREN) 1 % GEL Apply 1 Application topically 4 (four) times daily as needed (pain).    [provider]  hydrochlorothiazide (HYDRODIURIL) 25 MG tablet TAKE 1 TABLET(25 MG) BY MOUTH DAILY 05/10/22   Kerri Perches, MD  HYDROcodone-acetaminophen (NORCO/VICODIN) 5-325 MG tablet Take 1 tablet by mouth every 6 (six) hours as needed for moderate pain or severe pain. Do not take and drive 3/66/44   Warner Mccreedy D, NP  mirabegron ER (MYRBETRIQ) 50 MG TB24 tablet TAKE 1 TABLET(50 MG) BY MOUTH DAILY 09/13/22   Kerri Perches, MD  olmesartan (BENICAR) 20 MG tablet Take 1 tablet (20 mg total) by mouth daily. 12/30/21   Kerri Perches, MD  polyethylene glycol (MIRALAX / GLYCOLAX) 17 g packet Take 17 g by mouth daily.    [provider]  potassium chloride (KLOR-CON M) 10 MEQ tablet Take 1 tablet (10 mEq total) by mouth 2 (two) times daily. 09/22/22   Kerri Perches, MD  senna-docusate (SENOKOT-S) 8.6-50 MG tablet Take 2 tablets by  mouth at bedtime. For AFTER surgery, do not take if having diarrhea 09/06/22   Warner Mccreedy D, NP  tiZANidine (ZANAFLEX) 4 MG tablet TAKE 1 TABLET BY MOUTH TWICE DAILY 10/06/22   Kerri Perches, MD  Vitamin D, Ergocalciferol, (DRISDOL) 1.25 MG (50000 UNIT) CAPS capsule Take 1 capsule (50,000 Units total) by mouth every 7 (seven) days. 08/23/22   Kerri Perches, MD      Allergies    Hydrochlorothiazide w-triamterene, Spironolactone, and Tramadol    Review of Systems   Review of Systems  Gastrointestinal:  Positive for abdominal pain.    Physical Exam Updated Vital Signs BP (!) 187/70 (BP Location: Left Arm)   Pulse 65   Temp 98.3 F (36.8 C) (Oral)   Resp 18   Ht 5\' 2"  (1.575 m)   Wt 64.9 kg   LMP 05/10/2016   SpO2 97%   BMI 26.16 kg/m  Physical Exam Vitals and nursing note reviewed.  HENT:     Head: Atraumatic.  Pulmonary:     Breath sounds: Normal breath sounds.  Abdominal:     Comments: Mild distention.  Diffuse tenderness.  Surgical sites appear clean dry and intact.  Skin:    General: Skin is warm.  Neurological:     Mental Status: She is alert.     ED Results / Procedures / Treatments   Labs (all labs ordered are listed, but  only abnormal results are displayed) Labs Reviewed  COMPREHENSIVE METABOLIC PANEL - Abnormal; Notable for the following components:      Result Value   Glucose, Bld 123 (*)    Albumin 3.3 (*)    All other components within normal limits  CBC WITH DIFFERENTIAL/PLATELET - Abnormal; Notable for the following components:   WBC 12.0 (*)    Neutro Abs 9.8 (*)    All other components within normal limits    EKG None  Radiology CT ABDOMEN PELVIS W CONTRAST  Result Date: 10/15/2022 CLINICAL DATA:  Abdominal pain and vomiting. Hysterectomy 3 days ago. EXAM: CT ABDOMEN AND PELVIS WITH CONTRAST TECHNIQUE: Multidetector CT imaging of the abdomen and pelvis was performed using the standard protocol following bolus administration of  intravenous contrast. RADIATION DOSE REDUCTION: This exam was performed according to the departmental dose-optimization program which includes automated exposure control, adjustment of the mA and/or kV according to patient size and/or use of iterative reconstruction technique. CONTRAST:  OMNIPAQUE IOHEXOL 300 MG/ML  SOLN COMPARISON:  CT 09/03/2022 FINDINGS: Lower chest: Hypoventilatory and bandlike atelectasis. No pleural effusion. Hepatobiliary: No focal liver abnormality is seen. No gallstones, gallbladder wall thickening, or biliary dilatation. Pancreas: No ductal dilatation or inflammation. Spleen: Normal in size without focal abnormality. Small splenule anteriorly. Adrenals/Urinary Tract: 2.9 x 1.8 cm left adrenal nodule, not significantly changed allowing for differences in caliper placement. Normal right adrenal gland. No hydronephrosis or perinephric edema. Homogeneous renal enhancement with symmetric excretion on delayed phase imaging. Tiny left renal hypodensity is too small to characterize. No further follow-up imaging is recommended. Urinary bladder is physiologically distended without wall thickening. Stomach/Bowel: Moderate fluid distention of the stomach. Dilated fluid-filled small bowel small bowel transition point in the central abdomen series 5, image 43. more distal small bowel is decompressed. Normal appendix. Moderate stool in the right and transverse colon. Decompressed descending and sigmoid colon. Vascular/Lymphatic: Aorto bi-iliac atherosclerosis. No aneurysm. The portal vein is patent. No evidence of gonadal vein thrombosis. No enlarged lymph nodes in the abdomen or pelvis. Reproductive: Interval hysterectomy and resection of cystic pelvic mass. Other: Free intra-abdominal air is not unexpected post recent abdominal surgery. Small amount of dependent free fluid in the pelvis, simple, without organization. Soft tissue gas extends into the perineum and subcutaneous tissues. There is no  subcutaneous collection. Musculoskeletal: There are no acute or suspicious osseous abnormalities. IMPRESSION: 1. Small-bowel obstruction with transition point in the central abdomen. 2. Interval hysterectomy and resection of cystic pelvic mass. Free intra-abdominal air is not unexpected post recent abdominal surgery. Small amount of dependent free fluid in the pelvis. No abscess. 3. Unchanged left adrenal nodule, indeterminate. Aortic Atherosclerosis (ICD10-I70.0). Electronically Signed   By: Narda Rutherford M.D.   On: 10/15/2022 23:22    Procedures Procedures    Medications Ordered in ED Medications  sodium chloride 0.9 % bolus 500 mL (500 mLs Intravenous New Bag/Given 10/15/22 2215)  ondansetron (ZOFRAN) injection 4 mg (4 mg Intravenous Given 10/15/22 2216)  HYDROmorphone (DILAUDID) injection 0.5 mg (0.5 mg Intravenous Given 10/15/22 2216)  iohexol (OMNIPAQUE) 300 MG/ML solution 100 mL (100 mLs Intravenous Contrast Given 10/15/22 2254)    ED Course/ Medical Decision Making/ A&P                                 Medical Decision Making Amount and/or Complexity of Data Reviewed Labs: ordered. Radiology: ordered.  Risk Prescription drug management.  Patient with abdominal pain.  Nausea vomiting with decreased movement from below.  Recent surgery.  Differential diagnosis includes obstruction dehydration and ileus.  Blood work reassuring overall.  White count mildly elevated at 12.  Will get CT scan to evaluate.  CT scan shows bowel obstruction with transition point in the small bowel.  Discussed with Dr. Ruthe Mannan covering gynecology oncology.  Excepted in transfer to [facility].  NG tube will be placed.  I discussed with radiology techs will [employer] the CT scan.         Final Clinical Impression(s) / ED Diagnoses Final diagnoses:  Small bowel obstruction [facility])    Rx / DC Orders ED Discharge Orders     None         Benjiman Core, MD 10/15/22 [phone]

## 2022-10-15 NOTE — ED Notes (Signed)
ED Provider at bedside. 

## 2022-10-15 NOTE — ED Triage Notes (Signed)
Pt arrives from home via [facility] with c/o abdominal pain and vomiting. Pt had hysterectomy at [facility] on Tuesday and has been taking pain meds as prescribed up until she started vomiting this morning around 0330. No BM since surgery.

## 2022-10-16 ENCOUNTER — Emergency Department (HOSPITAL_COMMUNITY): Payer: 59

## 2022-10-16 DIAGNOSIS — K56609 Unspecified intestinal obstruction, unspecified as to partial versus complete obstruction: Secondary | ICD-10-CM | POA: Insufficient documentation

## 2022-10-16 MED ORDER — HYDROMORPHONE HCL 1 MG/ML IJ SOLN
0.5000 mg | INTRAMUSCULAR | Status: DC | PRN
  Administered 2022-10-16: 0.5 mg via INTRAVENOUS
  Filled 2022-10-16: qty 0.5

## 2022-10-16 MED ORDER — ONDANSETRON HCL 4 MG/2ML IJ SOLN
4.0000 mg | Freq: Four times a day (QID) | INTRAMUSCULAR | Status: DC | PRN
  Administered 2022-10-16: 4 mg via INTRAVENOUS
  Filled 2022-10-16: qty 2

## 2022-10-16 NOTE — ED Provider Notes (Signed)
Patient resting comfortably.  The patient tolerated NG tube placement, and x-ray shows appropriate placement  Patient is awaiting transport. Patient reports her pain is managed at this time   Zadie Rhine, MD 10/16/22 [phone]

## 2022-10-16 NOTE — ED Notes (Signed)
Verbal order to start NG tube low intermittent suction

## 2022-10-16 NOTE — ED Notes (Signed)
XR at bedside

## 2022-10-16 NOTE — ED Notes (Addendum)
[facility] at bedside for transport.Family updated with address and bed #

## 2022-10-16 NOTE — ED Notes (Signed)
[facility] made aware that transportation is still needed

## 2022-10-16 NOTE — ED Notes (Signed)
[facility] enroute with [facility] ~1hr

## 2022-10-18 DIAGNOSIS — I10 Essential (primary) hypertension: Secondary | ICD-10-CM | POA: Insufficient documentation

## 2022-10-28 ENCOUNTER — Telehealth: Payer: Self-pay | Admitting: Family Medicine

## 2022-10-28 ENCOUNTER — Telehealth: Payer: Self-pay

## 2022-10-28 ENCOUNTER — Other Ambulatory Visit: Payer: Self-pay | Admitting: Gynecologic Oncology

## 2022-10-28 ENCOUNTER — Telehealth: Payer: Self-pay | Admitting: *Deleted

## 2022-10-28 DIAGNOSIS — R1084 Generalized abdominal pain: Secondary | ICD-10-CM

## 2022-10-28 NOTE — Telephone Encounter (Signed)
Attempted to reach Catherine Mclean in regards to medication refill. Left voicemail requesting call back.

## 2022-10-28 NOTE — Telephone Encounter (Signed)
-----   Message from [place], [place] sent at 10/28/2022  8:03 AM EDT ----- Regarding: Move postop Can this pt's postop be pushed 1 week? She is going home from [facility] today from admission for SBO.  Thanks, [employer]

## 2022-10-28 NOTE — Telephone Encounter (Signed)
Patients son aware 

## 2022-10-28 NOTE — Telephone Encounter (Signed)
Post op rescheduled for 9/16 @ 2:30

## 2022-10-28 NOTE — Telephone Encounter (Signed)
Patient son calling says the hospital she's at suggested contacting her primary care to get refills on her Hydrocodone since she is getting discharged and is still in a lot of pain. Please advise Thank you

## 2022-10-29 ENCOUNTER — Encounter: Payer: Self-pay | Admitting: Psychiatry

## 2022-10-29 ENCOUNTER — Other Ambulatory Visit: Payer: Self-pay | Admitting: Gynecologic Oncology

## 2022-10-29 DIAGNOSIS — N9489 Other specified conditions associated with female genital organs and menstrual cycle: Secondary | ICD-10-CM

## 2022-10-29 DIAGNOSIS — R5381 Other malaise: Secondary | ICD-10-CM

## 2022-10-29 NOTE — Progress Notes (Signed)
See mychart messages. Pt requesting bedside commode

## 2022-11-01 ENCOUNTER — Encounter: Payer: 59 | Admitting: Psychiatry

## 2022-11-03 ENCOUNTER — Encounter: Payer: Self-pay | Admitting: *Deleted

## 2022-11-03 NOTE — Telephone Encounter (Signed)
Attempted to reach Catherine Mclean in regards to Rx. Refill. Left voicemail requesting call back.

## 2022-11-08 ENCOUNTER — Inpatient Hospital Stay: Payer: 59 | Attending: Psychiatry | Admitting: Psychiatry

## 2022-11-08 VITALS — BP 142/73 | HR 75 | Temp 98.4°F | Resp 16 | Wt 118.6 lb

## 2022-11-08 DIAGNOSIS — D270 Benign neoplasm of right ovary: Secondary | ICD-10-CM

## 2022-11-08 DIAGNOSIS — K56609 Unspecified intestinal obstruction, unspecified as to partial versus complete obstruction: Secondary | ICD-10-CM

## 2022-11-08 DIAGNOSIS — N9489 Other specified conditions associated with female genital organs and menstrual cycle: Secondary | ICD-10-CM

## 2022-11-08 DIAGNOSIS — Z90710 Acquired absence of both cervix and uterus: Secondary | ICD-10-CM

## 2022-11-08 DIAGNOSIS — Z90722 Acquired absence of ovaries, bilateral: Secondary | ICD-10-CM

## 2022-11-08 NOTE — Patient Instructions (Signed)
It was a pleasure to see you in clinic today. - Healing well - Okay to return to work in 2 weeks - Return visit planned for 3-4 wks  Thank you very much for allowing me to provide care for you today.  I appreciate your confidence in choosing our Gynecologic Oncology team at [facility].  If you have any questions about your visit today please call our office or send [place] a MyChart message and we will get back to you as soon as possible.

## 2022-11-08 NOTE — Progress Notes (Unsigned)
Gynecologic Oncology Return Clinic Visit  Date of Service: 11/08/2022 Referring Provider: Kerri Perches, MD [address] [place],  [place] [address]***  Assessment & Plan: Catherine Mclean is a 62 y.o. woman with Stage *** who is *** weeks s/p *** on ***.  Postop: - Pt recovering well from surgery and healing appropriately postoperatively - Intraoperative findings and pathology results reviewed. - Ongoing postoperative expectations and precautions reviewed. Continue with no lifting >10lbs through 6 weeks postoperatively - Pt works ***. Okay to return to work at [employer] - Given that uterus is in situ, pt advised that she should continue with pap smear screening per routine until age 5 if she continues with negative/low grade paps.  - Reviewed that after 12 months without menstrual cycles, she should not have any spotting or bleeding.  If this were to occur, she should be evaluated for postmenopausal bleeding.  ***  ***VTE Prophylaxis: - Khorana score = ***  RTC ***.  Clide Cliff, MD Gynecologic Oncology   Medical Decision Making I personally spent  TOTAL *** minutes face-to-face and non-face-to-face in the care of this patient, which includes all pre, intra, and post visit time on the date of service.  *** minutes spent reviewing records prior to the visit *** Minutes in patient contact      *** minutes in other billable services *** minutes charting , conferring with consultants etc.   ----------------------- Reason for Visit: Postop***  Treatment History: Oncology History   No history exists.    Interval History: Pt reports that she is recovering well from surgery. She is using *** for pain. She is eating and drinking well. She is voiding without issue and having regular bowel movements.   ***solid bm 2-3x/day Tylenol prn Gas Eating/drink No bleeding  Past Medical/Surgical History: Past Medical History:  Diagnosis Date   Anxiety    Arthritis     Bradycardia    Depression    Hyperlipidemia    Hypertension     Past Surgical History:  Procedure Laterality Date   BIOPSY  04/12/2018   Procedure: BIOPSY;  Surgeon: Malissa Hippo, MD;  [facility];  Service: Endoscopy;;  appendicial orffice mucosa   COLONOSCOPY N/A 04/12/2018   Procedure: COLONOSCOPY;  Surgeon: Malissa Hippo, MD;  [facility];  Service: Endoscopy;  Laterality: N/A;  930   OVARIAN CYST SURGERY     In her 20's, cystectomy?   POLYPECTOMY  04/12/2018   Procedure: POLYPECTOMY;  Surgeon: Malissa Hippo, MD;  [facility];  Service: Endoscopy;;  splenic flexure   ROBOTIC ASSISTED TOTAL HYSTERECTOMY WITH BILATERAL SALPINGO OOPHERECTOMY Bilateral 10/12/2022   Procedure: MINI LAPAROTOMY FOR CYST DRAINAGE, XI ROBOTIC ASSISTED RIGHT SALPINGO- OOPHORECTOMY WITH ROBOTIC TOTAL HYSTERECTOMY,PARTIAL OMENTECTOMY;  Surgeon: Clide Cliff, MD;  Location: [facility];  Service: Gynecology;  Laterality: Bilateral;    Family History  Problem Relation Age of Onset   Hypertension Sister    Hypertension Sister    Hyperlipidemia Sister    Ulcerative colitis Sister    Arthritis/Rheumatoid Sister    Cancer Sister        unkonwn cancer   Breast cancer Neg Hx    Ovarian cancer Neg Hx    Endometrial cancer Neg Hx     Social History   Socioeconomic History   Marital status: Divorced    Spouse name: Not on file   Number of children: Not on file   Years of education: Not on  file   Highest education level: 12th grade  Occupational History   Not on file  Tobacco Use   Smoking status: Former    Current packs/day: 0.00    Types: Cigarettes    Quit date: 12/19/2016    Years since quitting: 5.8   Smokeless tobacco: Never   Tobacco comments:    quit after her last visit   Vaping Use   Vaping status: Former  Substance and Sexual Activity   Alcohol use: Not Currently   Drug use: No   Sexual activity: Not Currently  Other Topics Concern    Not on file  Social History Narrative   Not on file   Social Determinants of Health   Financial Resource Strain: Low Risk  (08/23/2022)   Overall Financial Resource Strain (CARDIA)    Difficulty of Paying Living Expenses: Not hard at all  Food Insecurity: No Food Insecurity (08/23/2022)   Hunger Vital Sign    Worried About Running Out of Food in the Last Year: Never true    Ran Out of Food in the Last Year: Never true  Transportation Needs: No Transportation Needs (08/23/2022)   PRAPARE - Administrator, Civil Service (Medical): No    Lack of Transportation (Non-Medical): No  Physical Activity: Insufficiently Active (08/23/2022)   Exercise Vital Sign    Days of Exercise per Week: 3 days    Minutes of Exercise per Session: 30 min  Stress: Stress Concern Present (08/23/2022)   [employer] of Occupational Health - Occupational Stress Questionnaire    Feeling of Stress : To some extent  Social Connections: Moderately Integrated (08/23/2022)   Social Connection and Isolation Panel [NHANES]    Frequency of Communication with Friends and Family: More than three times a week    Frequency of Social Gatherings with Friends and Family: Once a week    Attends Religious Services: More than 4 times per year    Active Member of [employer] or Organizations: Yes    Attends Banker Meetings: 1 to 4 times per year    Marital Status: Divorced    Current Medications:  Current Outpatient Medications:    acetaminophen (TYLENOL) 650 MG CR tablet, Take 650 mg by mouth every 8 (eight) hours as needed for pain., Disp: , Rfl:    diclofenac Sodium (VOLTAREN) 1 % GEL, Apply 1 Application topically 4 (four) times daily as needed (pain)., Disp: , Rfl:    hydrochlorothiazide (HYDRODIURIL) 25 MG tablet, TAKE 1 TABLET(25 MG) BY MOUTH DAILY, Disp: 90 tablet, Rfl: 3   HYDROcodone-acetaminophen (NORCO/VICODIN) 5-325 MG tablet, Take 1 tablet by mouth every 6 (six) hours as needed for moderate pain or  severe pain. Do not take and drive, Disp: 10 tablet, Rfl: 0   mirabegron ER (MYRBETRIQ) 50 MG TB24 tablet, TAKE 1 TABLET(50 MG) BY MOUTH DAILY, Disp: 30 tablet, Rfl: 3   olmesartan (BENICAR) 20 MG tablet, Take 1 tablet (20 mg total) by mouth daily., Disp: 90 tablet, Rfl: 3   polyethylene glycol (MIRALAX / GLYCOLAX) 17 g packet, Take 17 g by mouth daily., Disp: , Rfl:    potassium chloride (KLOR-CON M) 10 MEQ tablet, Take 1 tablet (10 mEq total) by mouth 2 (two) times daily., Disp: 60 tablet, Rfl: 0   senna-docusate (SENOKOT-S) 8.6-50 MG tablet, Take 2 tablets by mouth at bedtime. For AFTER surgery, do not take if having diarrhea, Disp: 30 tablet, Rfl: 0   tiZANidine (ZANAFLEX) 4 MG tablet, TAKE 1 TABLET BY  MOUTH TWICE DAILY, Disp: 60 tablet, Rfl: 5   Vitamin D, Ergocalciferol, (DRISDOL) 1.25 MG (50000 UNIT) CAPS capsule, Take 1 capsule (50,000 Units total) by mouth every 7 (seven) days., Disp: 5 capsule, Rfl: 5  Review of Symptoms: Complete 10-system review is positive for: Change in appetite, anxiety  Physical Exam: LMP 05/10/2016  General: ***Alert, oriented, no acute distress. HEENT: ***Normocephalic, atraumatic. Neck symmetric without masses. Sclera anicteric.  Chest: ***Normal work of breathing. ***Clear to auscultation bilaterally.   Cardiovascular: ***Regular rate and rhythm, no murmurs. Abdomen: ***Soft, nontender.  Normoactive bowel sounds.  No masses appreciated.  ***Well-healing incision***s. Extremities: ***Grossly normal range of motion.  Warm, well perfused.  No edema bilaterally. Skin: ***No rashes or lesions noted. GU: Normal appearing external genitalia without erythema, excoriation, or lesions.  Speculum exam reveals ***.  Bimanual exam reveals ***. Exam chaperoned by Kimberly [place], CMA  Well healing Glue on midline  Laboratory & Radiologic Studies: ***

## 2022-11-11 ENCOUNTER — Encounter: Payer: Self-pay | Admitting: Psychiatry

## 2022-11-15 ENCOUNTER — Other Ambulatory Visit: Payer: Self-pay | Admitting: Family Medicine

## 2022-11-23 ENCOUNTER — Encounter: Payer: Self-pay | Admitting: Family Medicine

## 2022-11-23 ENCOUNTER — Other Ambulatory Visit: Payer: Self-pay

## 2022-11-23 DIAGNOSIS — E782 Mixed hyperlipidemia: Secondary | ICD-10-CM

## 2022-11-23 DIAGNOSIS — I10 Essential (primary) hypertension: Secondary | ICD-10-CM

## 2022-11-23 MED ORDER — OLMESARTAN MEDOXOMIL 20 MG PO TABS: 20.0000 mg | ORAL_TABLET | Freq: Every day | ORAL | 3 refills | Status: DC

## 2022-11-30 ENCOUNTER — Encounter: Payer: Self-pay | Admitting: Family Medicine

## 2022-12-01 LAB — CBC
Hematocrit: 38.3 % (ref 34.0–46.6)
Hemoglobin: 11.9 g/dL (ref 11.1–15.9)
MCH: 27 pg (ref 26.6–33.0)
MCHC: 31.1 g/dL — ABNORMAL LOW (ref 31.5–35.7)
MCV: 87 fL (ref 79–97)
Platelets: 288 10*3/uL (ref 150–450)
RBC: 4.4 x10E6/uL (ref 3.77–5.28)
RDW: 14.9 % (ref 11.7–15.4)
WBC: 9.8 10*3/uL (ref 3.4–10.8)

## 2022-12-01 LAB — LIPID PANEL
Chol/HDL Ratio: 3.4 {ratio} (ref 0.0–4.4)
Cholesterol, Total: 194 mg/dL (ref 100–199)
HDL: 57 mg/dL (ref >39)
LDL Chol Calc (NIH): 121 mg/dL — ABNORMAL HIGH (ref 0–99)
Triglycerides: 87 mg/dL (ref 0–149)
VLDL Cholesterol Cal: 16 mg/dL (ref 5–40)

## 2022-12-01 LAB — CMP14+EGFR
ALT: 18 [IU]/L (ref 0–32)
AST: 26 [IU]/L (ref 0–40)
Albumin: 4.2 g/dL (ref 3.9–4.9)
Alkaline Phosphatase: 101 [IU]/L (ref 44–121)
BUN/Creatinine Ratio: 9 — ABNORMAL LOW (ref 12–28)
BUN: 7 mg/dL — ABNORMAL LOW (ref 8–27)
Bilirubin Total: 0.2 mg/dL (ref 0.0–1.2)
CO2: 23 mmol/L (ref 20–29)
Calcium: 10.3 mg/dL (ref 8.7–10.3)
Chloride: 106 mmol/L (ref 96–106)
Creatinine, Ser: 0.78 mg/dL (ref 0.57–1.00)
Globulin, Total: 2.7 g/dL (ref 1.5–4.5)
Glucose: 89 mg/dL (ref 70–99)
Potassium: 4.8 mmol/L (ref 3.5–5.2)
Sodium: 142 mmol/L (ref 134–144)
Total Protein: 6.9 g/dL (ref 6.0–8.5)
eGFR: 85 mL/min/{1.73_m2} (ref >59)

## 2022-12-06 ENCOUNTER — Encounter: Payer: Self-pay | Admitting: Psychiatry

## 2022-12-06 ENCOUNTER — Inpatient Hospital Stay: Payer: 59 | Attending: Psychiatry | Admitting: Psychiatry

## 2022-12-06 VITALS — BP 133/72 | HR 76 | Temp 98.0°F | Resp 20 | Wt 122.6 lb

## 2022-12-06 DIAGNOSIS — Z90722 Acquired absence of ovaries, bilateral: Secondary | ICD-10-CM

## 2022-12-06 DIAGNOSIS — K56609 Unspecified intestinal obstruction, unspecified as to partial versus complete obstruction: Secondary | ICD-10-CM

## 2022-12-06 DIAGNOSIS — Z90710 Acquired absence of both cervix and uterus: Secondary | ICD-10-CM

## 2022-12-06 DIAGNOSIS — N9489 Other specified conditions associated with female genital organs and menstrual cycle: Secondary | ICD-10-CM

## 2022-12-06 NOTE — Progress Notes (Signed)
Gynecologic Oncology Return Clinic Visit  Date of Service: 12/06/2022 Referring Provider: Kerri Perches, MD [address] [place],  [place] [address]  Assessment & Plan: Catherine Mclean is a 62 y.o. woman with a large pelvic mass who is s/p mini laparotomy for controlled cyst drainage, robotic assisted total laparoscopic hysterectomy, right salpingo-oophorectomy, lysis of adhesions, infracolic omentectomy on 10/12/2022 with benign final pathology.  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  Presents today for postop follow-up.  Postop: - Pt recovering well from surgery and healing appropriately postoperatively -Postop restrictions lifted. -Resume routine care.  SBO, resolved: -Resumed normal bowel activity   RTC as needed  Clide Cliff, MD Gynecologic Oncology   ----------------------- Reason for Visit: Postop follow-up    Interval History: Patient reports she is overall doing much better.  She is eating better and has gained 4 pounds.  Having daily formed bowel movement.  Emptying bladder without issue.  No bleeding.  No pain.  Has returned back to work and this is going well.   Past Medical/Surgical History: Past Medical History:  Diagnosis Date   Anxiety    Arthritis    Bradycardia    Depression    Hyperlipidemia    Hypertension     Past Surgical History:  Procedure Laterality Date   BIOPSY  04/12/2018   Procedure: BIOPSY;  Surgeon: Malissa Hippo, MD;  [facility];  Service: Endoscopy;;  appendicial orffice mucosa   COLONOSCOPY N/A 04/12/2018   Procedure: COLONOSCOPY;  Surgeon: Malissa Hippo, MD;  [facility];  Service: Endoscopy;  Laterality: N/A;  930   OVARIAN CYST SURGERY     In her 20's, cystectomy?    POLYPECTOMY  04/12/2018   Procedure: POLYPECTOMY;  Surgeon: Malissa Hippo, MD;  [facility];  Service: Endoscopy;;  splenic flexure   ROBOTIC ASSISTED TOTAL HYSTERECTOMY WITH BILATERAL SALPINGO OOPHERECTOMY Bilateral 10/12/2022   Procedure: MINI LAPAROTOMY FOR CYST DRAINAGE, XI ROBOTIC ASSISTED RIGHT SALPINGO- OOPHORECTOMY WITH ROBOTIC TOTAL HYSTERECTOMY,PARTIAL OMENTECTOMY;  Surgeon: Clide Cliff, MD;  [facility];  Service: Gynecology;  Laterality: Bilateral;    Family History  Problem Relation Age of Onset   Hypertension Sister    Hypertension Sister    Hyperlipidemia Sister    Ulcerative colitis Sister    Arthritis/Rheumatoid Sister    Cancer Sister        unkonwn cancer   Breast cancer Neg Hx    Ovarian cancer Neg Hx    Endometrial cancer Neg Hx     Social History   Socioeconomic History   Marital status: Divorced    Spouse name: Not on file   Number of children: Not on file   Years of education: Not on file   Highest education level: 12th grade  Occupational History   Not on file  Tobacco Use   Smoking status: Former    Current packs/day: 0.00    Types: Cigarettes    Quit date: 12/19/2016    Years since quitting: 5.9   Smokeless tobacco: Never   Tobacco comments:    quit after her last visit   Vaping Use   Vaping status: Former  Substance and Sexual Activity   Alcohol use: Not Currently   Drug use: No   Sexual activity: Not Currently  Other Topics  Concern   Not on file  Social History Narrative   Not on file   Social Determinants of Health   Financial Resource Strain: Low Risk  (08/23/2022)   Overall Financial Resource Strain (CARDIA)    Difficulty of Paying Living Expenses: Not hard at all  Food Insecurity: No Food Insecurity (08/23/2022)   Hunger Vital Sign    Worried About Running Out of Food in the Last Year: Never true    Ran Out of Food in the Last Year: Never true  Transportation Needs: No Transportation Needs (08/23/2022)    PRAPARE - Administrator, Civil Service (Medical): No    Lack of Transportation (Non-Medical): No  Physical Activity: Insufficiently Active (08/23/2022)   Exercise Vital Sign    Days of Exercise per Week: 3 days    Minutes of Exercise per Session: 30 min  Stress: Stress Concern Present (08/23/2022)   [employer] of Occupational Health - Occupational Stress Questionnaire    Feeling of Stress : To some extent  Social Connections: Moderately Integrated (08/23/2022)   Social Connection and Isolation Panel [NHANES]    Frequency of Communication with Friends and Family: More than three times a week    Frequency of Social Gatherings with Friends and Family: Once a week    Attends Religious Services: More than 4 times per year    Active Member of [employer] or Organizations: Yes    Attends Banker Meetings: 1 to 4 times per year    Marital Status: Divorced    Current Medications:  Current Outpatient Medications:    acetaminophen (TYLENOL) 650 MG CR tablet, Take 650 mg by mouth every 8 (eight) hours as needed for pain., Disp: , Rfl:    diclofenac Sodium (VOLTAREN) 1 % GEL, Apply 1 Application topically 4 (four) times daily as needed (pain)., Disp: , Rfl:    hydrochlorothiazide (HYDRODIURIL) 25 MG tablet, TAKE 1 TABLET(25 MG) BY MOUTH DAILY, Disp: 90 tablet, Rfl: 3   HYDROcodone-acetaminophen (NORCO/VICODIN) 5-325 MG tablet, Take 1 tablet by mouth every 6 (six) hours as needed for moderate pain or severe pain. Do not take and drive, Disp: 10 tablet, Rfl: 0   mirabegron ER (MYRBETRIQ) 50 MG TB24 tablet, TAKE 1 TABLET(50 MG) BY MOUTH DAILY, Disp: 30 tablet, Rfl: 3   olmesartan (BENICAR) 20 MG tablet, Take 1 tablet (20 mg total) by mouth daily., Disp: 90 tablet, Rfl: 3   polyethylene glycol (MIRALAX / GLYCOLAX) 17 g packet, Take 17 g by mouth daily., Disp: , Rfl:    potassium chloride (KLOR-CON M) 10 MEQ tablet, Take 1 tablet (10 mEq total) by mouth 2 (two) times daily., Disp: 60  tablet, Rfl: 0   senna-docusate (SENOKOT-S) 8.6-50 MG tablet, Take 2 tablets by mouth at bedtime. For AFTER surgery, do not take if having diarrhea, Disp: 30 tablet, Rfl: 0   tiZANidine (ZANAFLEX) 4 MG tablet, TAKE 1 TABLET BY MOUTH TWICE DAILY, Disp: 60 tablet, Rfl: 5   Vitamin D, Ergocalciferol, (DRISDOL) 1.25 MG (50000 UNIT) CAPS capsule, Take 1 capsule (50,000 Units total) by mouth every 7 (seven) days., Disp: 5 capsule, Rfl: 5  Review of Symptoms: Complete 10-system review is positive for: improved appetite  Physical Exam: BP 133/72 (BP Location: Left Arm)   Pulse 76   Temp 98 F (36.7 C) (Oral)   Resp 20   Wt 122 lb 9.6 oz (55.6 kg)   LMP 05/10/2016   SpO2 100%   BMI 22.42 kg/m  General: Alert,  oriented, no acute distress. HEENT: Normocephalic, atraumatic. Neck symmetric without masses. Sclera anicteric.  Chest: Normal work of breathing.  Abdomen: Soft, nontender.  Normoactive bowel sounds.  No masses appreciated.  Well-healed incisions Extremities: Grossly normal range of motion.  Warm, well perfused.  No edema bilaterally. Skin: No rashes or lesions noted.  Laboratory & Radiologic Studies: none

## 2022-12-06 NOTE — Patient Instructions (Signed)
It was a pleasure to see you in clinic today. - All restrictions lifted at this time - Okay to resume routine care.  Thank you very much for allowing me to provide care for you today.  I appreciate your confidence in choosing our Gynecologic Oncology team at [facility].  If you have any questions about your visit today please call our office or send [place] a MyChart message and we will get back to you as soon as possible.

## 2022-12-07 ENCOUNTER — Ambulatory Visit (INDEPENDENT_AMBULATORY_CARE_PROVIDER_SITE_OTHER): Payer: 59

## 2022-12-07 DIAGNOSIS — Z23 Encounter for immunization: Secondary | ICD-10-CM

## 2023-01-14 ENCOUNTER — Encounter: Payer: Self-pay | Admitting: Family Medicine

## 2023-01-14 ENCOUNTER — Ambulatory Visit (INDEPENDENT_AMBULATORY_CARE_PROVIDER_SITE_OTHER): Payer: 59 | Admitting: Family Medicine

## 2023-01-14 VITALS — BP 138/82 | HR 71 | Ht 63.0 in | Wt 133.1 lb

## 2023-01-14 DIAGNOSIS — F5104 Psychophysiologic insomnia: Secondary | ICD-10-CM

## 2023-01-14 DIAGNOSIS — E559 Vitamin D deficiency, unspecified: Secondary | ICD-10-CM | POA: Diagnosis not present

## 2023-01-14 DIAGNOSIS — M542 Cervicalgia: Secondary | ICD-10-CM

## 2023-01-14 DIAGNOSIS — Z1231 Encounter for screening mammogram for malignant neoplasm of breast: Secondary | ICD-10-CM | POA: Diagnosis not present

## 2023-01-14 DIAGNOSIS — E782 Mixed hyperlipidemia: Secondary | ICD-10-CM

## 2023-01-14 DIAGNOSIS — I10 Essential (primary) hypertension: Secondary | ICD-10-CM | POA: Diagnosis not present

## 2023-01-14 DIAGNOSIS — M170 Bilateral primary osteoarthritis of knee: Secondary | ICD-10-CM

## 2023-01-14 MED ORDER — PREDNISONE 10 MG PO TABS: 10.0000 mg | ORAL_TABLET | Freq: Two times a day (BID) | ORAL | 0 refills | Status: DC

## 2023-01-14 NOTE — Patient Instructions (Addendum)
F/u in 4 months, call if you need me sooner  Please schedule mammogram at checkout  Fasting lipid, cmp and EGFr, CBC and vit D 1 week before next appointment  5 day course of prednisone is prescribed for knee pain, right worse than left. Let me know if you want to be referred to Orthopedics and to who   Careful not to fall  Thanks for choosing [facility], we consider it a privelige to serve you.

## 2023-01-15 NOTE — Progress Notes (Signed)
   Catherine Mclean     MRN: [id number]      DOB: [birth year]  Chief Complaint  Patient presents with   Follow-up    Follow up    HPI Catherine Mclean is here for follow up and re-evaluation of chronic medical conditions, medication management and review of any available recent lab and radiology data.  Preventive health is updated, specifically  Cancer screening and Immunization.   Has had extensive gyne surgry, removal of womb and right ovary and tube, then lysis of adhesions , doing well fortunately no cancer The PT denies any adverse reactions to current medications since the last visit.  C/o bilateral knee pain, right worse than left , no recent trauma, no near falls ROS Denies recent fever or chills. Denies sinus pressure, nasal congestion, ear pain or sore throat. Denies chest congestion, productive cough or wheezing. Denies chest pains, palpitations and leg swelling Denies abdominal pain, nausea, vomiting,diarrhea or constipation.   Denies dysuria, frequency, hesitancy or incontinence. . Denies headaches, seizures, numbness, or tingling. Denies depression, anxiety or insomnia. Denies skin break down or rash.   PE  BP 138/82   Pulse 71   Ht 5\' 3"  (1.6 m)   Wt 133 lb 1.3 oz (60.4 kg)   LMP 05/10/2016   SpO2 99%   BMI 23.57 kg/m   Patient alert and oriented and in no cardiopulmonary distress.  HEENT: No facial asymmetry, EOMI,     Neck supple .  Chest: Clear to auscultation bilaterally.  CVS: S1, S2 no murmurs, no S3.Regular rate.  ABD: Soft non tender.   Ext: No edema  MS: Adequate ROM spine, shoulders, hips and  reudknees.  Skin: Intact, no ulcerations or rash noted.  Psych: Good eye contact, normal affect. Memory intact not anxious or depressed appearing.  CNS: CN 2-12 intact, power,  normal throughout.no focal deficits noted.   Assessment & Plan  Vitamin D deficiency Continue weekly supplement Updated lab needed at/ before next visit.   Insomnia Sleep  hygiene reviewed appropriate hygiene practiced. OTC benadryl I s effective  Neck pain Good response to muscle relaxant  Essential hypertension Sub optimal control at this visit, re evaluate in next 3 months. DASH diet and commitment to daily physical activity for a minimum of 30 minutes discussed and encouraged, as a part of hypertension management. The importance of attaining a healthy weight is also discussed.     01/14/2023    9:35 AM 01/14/2023    8:54 AM 01/14/2023    8:52 AM 12/06/2022    2:59 PM 11/08/2022    2:58 PM 10/16/2022    4:41 AM 10/16/2022    4:00 AM  BP/Weight  Systolic BP 138 194 182 133 142 135 135  Diastolic BP 82 80 101 72 73 62 82  Wt. (Lbs)   133.08 122.6 118.6    BMI   23.57 kg/m2 22.42 kg/m2 21.69 kg/m2         Bilateral primary osteoarthritis of knee Current right flare, short course prednisone , pt to call for [facility] referral when she decides on this  Hyperlipemia Hyperlipidemia:Low fat diet discussed and encouraged.   Lipid Panel  Lab Results  Component Value Date   CHOL 194 11/30/2022   HDL 57 11/30/2022   LDLCALC 121 (H) 11/30/2022   TRIG 87 11/30/2022   CHOLHDL 3.4 11/30/2022     Not at goal, neds to lower fat intake Updated lab needed at/ before next visit.

## 2023-01-15 NOTE — Assessment & Plan Note (Signed)
Current right flare, short course prednisone , pt to call for [facility] referral when she decides on this

## 2023-01-15 NOTE — Assessment & Plan Note (Signed)
Hyperlipidemia:Low fat diet discussed and encouraged.   Lipid Panel  Lab Results  Component Value Date   CHOL 194 11/30/2022   HDL 57 11/30/2022   LDLCALC 121 (H) 11/30/2022   TRIG 87 11/30/2022   CHOLHDL 3.4 11/30/2022     Not at goal, neds to lower fat intake Updated lab needed at/ before next visit.

## 2023-01-15 NOTE — Assessment & Plan Note (Signed)
Sub optimal control at this visit, re evaluate in next 3 months. DASH diet and commitment to daily physical activity for a minimum of 30 minutes discussed and encouraged, as a part of hypertension management. The importance of attaining a healthy weight is also discussed.     01/14/2023    9:35 AM 01/14/2023    8:54 AM 01/14/2023    8:52 AM 12/06/2022    2:59 PM 11/08/2022    2:58 PM 10/16/2022    4:41 AM 10/16/2022    4:00 AM  BP/Weight  Systolic BP 138 194 182 133 142 135 135  Diastolic BP 82 80 101 72 73 62 82  Wt. (Lbs)   133.08 122.6 118.6    BMI   23.57 kg/m2 22.42 kg/m2 21.69 kg/m2

## 2023-01-15 NOTE — Assessment & Plan Note (Signed)
Good response to muscle relaxant

## 2023-01-15 NOTE — Assessment & Plan Note (Signed)
Continue weekly supplement  Updated lab needed at/ before next visit.

## 2023-01-15 NOTE — Assessment & Plan Note (Signed)
Sleep hygiene reviewed appropriate hygiene practiced. OTC benadryl I s effective

## 2023-01-28 ENCOUNTER — Encounter (HOSPITAL_COMMUNITY): Payer: Self-pay

## 2023-01-28 ENCOUNTER — Ambulatory Visit (HOSPITAL_COMMUNITY)
Admission: RE | Admit: 2023-01-28 | Discharge: 2023-01-28 | Disposition: A | Discharge status: Home / Self Care | Payer: 59 | Source: Ambulatory Visit | Attending: Family Medicine | Admitting: Family Medicine

## 2023-01-28 DIAGNOSIS — Z1231 Encounter for screening mammogram for malignant neoplasm of breast: Secondary | ICD-10-CM | POA: Insufficient documentation

## 2023-03-24 ENCOUNTER — Other Ambulatory Visit: Payer: Self-pay | Admitting: Family Medicine

## 2023-05-17 ENCOUNTER — Ambulatory Visit (INDEPENDENT_AMBULATORY_CARE_PROVIDER_SITE_OTHER): Payer: 59 | Admitting: Family Medicine

## 2023-05-17 ENCOUNTER — Encounter: Payer: Self-pay | Admitting: Family Medicine

## 2023-05-17 VITALS — BP 140/60 | HR 82 | Ht 63.0 in | Wt 152.1 lb

## 2023-05-17 DIAGNOSIS — E663 Overweight: Secondary | ICD-10-CM | POA: Diagnosis not present

## 2023-05-17 DIAGNOSIS — E559 Vitamin D deficiency, unspecified: Secondary | ICD-10-CM

## 2023-05-17 DIAGNOSIS — I10 Essential (primary) hypertension: Secondary | ICD-10-CM

## 2023-05-17 DIAGNOSIS — M5442 Lumbago with sciatica, left side: Secondary | ICD-10-CM | POA: Diagnosis not present

## 2023-05-17 DIAGNOSIS — E785 Hyperlipidemia, unspecified: Secondary | ICD-10-CM

## 2023-05-17 DIAGNOSIS — R7302 Impaired glucose tolerance (oral): Secondary | ICD-10-CM | POA: Insufficient documentation

## 2023-05-17 MED ORDER — OLMESARTAN MEDOXOMIL 40 MG PO TABS: 40.0000 mg | ORAL_TABLET | Freq: Every day | ORAL | 3 refills | Status: DC

## 2023-05-17 MED ORDER — PREDNISONE 10 MG PO TABS: 10.0000 mg | ORAL_TABLET | Freq: Two times a day (BID) | ORAL | 0 refills | Status: DC

## 2023-05-17 MED ORDER — METHYLPREDNISOLONE ACETATE 80 MG/ML IJ SUSP
80.0000 mg | Freq: Once | INTRAMUSCULAR | Status: AC
  Administered 2023-05-17: 80 mg via INTRAMUSCULAR

## 2023-05-17 NOTE — Assessment & Plan Note (Signed)
 Updated lab needed at/ before next visit.

## 2023-05-17 NOTE — Progress Notes (Signed)
 Catherine Mclean     MRN: [id number]      DOB: 12-20-1960  Chief Complaint  Patient presents with   Medical Management of Chronic Issues    5 month HTN f/u  Feels as if she pulled a muscle in her back last Saturday. She has been in pain when she stands and walks. Pain is radiating down the left side of back.     HPI Ms. Vandall is here for follow up and re-evaluation of chronic medical conditions, medication management and review of any available recent lab and radiology data.  Preventive health is updated, specifically  Cancer screening and Immunization.   Questions or concerns regarding consultations or procedures which the PT has had in the interim are  addressed. The PT denies any adverse reactions to current medications since the last visit. Acute back pain on 05/14/2023 when she bent forward, was midline , now down to right buttock rated at 7  ROS Denies recent fever or chills. Denies sinus pressure, nasal congestion, ear pain or sore throat. Denies chest congestion, productive cough or wheezing. Denies chest pains, palpitations and leg swelling Denies abdominal pain, nausea, vomiting,diarrhea or constipation.   Denies dysuria, frequency, hesitancy or incontinence. Denies headaches, seizures, numbness, or tingling. Denies depression, anxiety or insomnia. Denies skin break down or rash.   PE  BP (!) 140/60   Pulse 82   Ht 5\' 3"  (1.6 m)   Wt 152 lb 1.9 oz (69 kg)   LMP 05/10/2016   SpO2 95%   BMI 26.95 kg/m   Patient alert and oriented and in no cardiopulmonary distress.  HEENT: No facial asymmetry, EOMI,     Neck supple .  Chest: Clear to auscultation bilaterally.  CVS: S1, S2 no murmurs, no S3.Regular rate.  ABD: Soft non tender.   Ext: No edema  MS: decreased ROM lumbar spine  ,adequate in neck and shoulders Skin: Intact, no ulcerations or rash noted.  Psych: Good eye contact, normal affect. Memory intact not anxious or depressed appearing.  CNS: CN 2-12  intact, power,  normal throughout.no focal deficits noted.   Assessment & Plan  .IGT (impaired glucose tolerance) Patient educated about the importance of limiting  Carbohydrate intake , the need to commit to daily physical activity for a minimum of 30 minutes , and to commit weight loss. The fact that changes in all these areas will reduce or eliminate all together the development of diabetes is stressed.      Latest Ref Rng & Units 11/30/2022    8:45 AM 10/15/2022   10:19 PM 09/27/2022    8:12 AM 09/21/2022   10:00 AM 08/20/2022   11:24 AM  Diabetic Labs  Chol 100 - 199 mg/dL 409     811   HDL >91 mg/dL 57     72   Calc LDL 0 - 99 mg/dL 478     76   Triglycerides 0 - 149 mg/dL 87     86   Creatinine 0.57 - 1.00 mg/dL 2.95  6.21  3.08  6.57  1.06       05/17/2023    9:41 AM 05/17/2023    9:34 AM 05/17/2023    9:27 AM 01/14/2023    9:35 AM 01/14/2023    8:54 AM 01/14/2023    8:52 AM 12/06/2022    2:59 PM  BP/Weight  Systolic BP 140 148 152 138 194 182 133  Diastolic BP 60 78 69 82 80 101 72  Wt. (Lbs)   152.12   133.08 122.6  BMI   26.95 kg/m2   23.57 kg/m2 22.42 kg/m2       No data to display          Updated lab needed at/ before next visit.   Hypertension Uncontrolled, inc olmesartan to 40 mg daily DASH diet and commitment to daily physical activity for a minimum of 30 minutes discussed and encouraged, as a part of hypertension management. The importance of attaining a healthy weight is also discussed.     05/17/2023    9:41 AM 05/17/2023    9:34 AM 05/17/2023    9:27 AM 01/14/2023    9:35 AM 01/14/2023    8:54 AM 01/14/2023    8:52 AM 12/06/2022    2:59 PM  BP/Weight  Systolic BP 140 148 152 138 194 182 133  Diastolic BP 60 78 69 82 80 101 72  Wt. (Lbs)   152.12   133.08 122.6  BMI   26.95 kg/m2   23.57 kg/m2 22.42 kg/m2       Overweight (BMI 25.0-29.9)  Patient re-educated about  the importance of commitment to a  minimum of 150 minutes of exercise  per week as able.  The importance of healthy food choices with portion control discussed, as well as eating regularly and within a 12 hour window most days. The need to choose "clean , green" food 50 to 75% of the time is discussed, as well as to make water the primary drink and set a goal of 64 ounces water daily.       05/17/2023    9:27 AM 01/14/2023    8:52 AM 12/06/2022    2:59 PM  Weight /BMI  Weight 152 lb 1.9 oz 133 lb 1.3 oz 122 lb 9.6 oz  Height 5\' 3"  (1.6 m) 5\' 3"  (1.6 m)   BMI 26.95 kg/m2 23.57 kg/m2 22.42 kg/m2    Worsened  Acute midline low back pain with left-sided sciatica Depomedrol 80 mg , and short course prednisone  Vitamin D deficiency Updated lab needed at/ before next visit.   Hyperlipemia Hyperlipidemia:Low fat diet discussed and encouraged. Updated lab needed at/ before next visit. Not controlled when checked   Lipid Panel  Lab Results  Component Value Date   CHOL 194 11/30/2022   HDL 57 11/30/2022   LDLCALC 121 (H) 11/30/2022   TRIG 87 11/30/2022   CHOLHDL 3.4 11/30/2022

## 2023-05-17 NOTE — Assessment & Plan Note (Addendum)
 Uncontrolled, inc olmesartan to 40 mg daily DASH diet and commitment to daily physical activity for a minimum of 30 minutes discussed and encouraged, as a part of hypertension management. The importance of attaining a healthy weight is also discussed.     05/17/2023    9:41 AM 05/17/2023    9:34 AM 05/17/2023    9:27 AM 01/14/2023    9:35 AM 01/14/2023    8:54 AM 01/14/2023    8:52 AM 12/06/2022    2:59 PM  BP/Weight  Systolic BP 140 148 152 138 194 182 133  Diastolic BP 60 78 69 82 80 101 72  Wt. (Lbs)   152.12   133.08 122.6  BMI   26.95 kg/m2   23.57 kg/m2 22.42 kg/m2

## 2023-05-17 NOTE — Assessment & Plan Note (Addendum)
 Hyperlipidemia:Low fat diet discussed and encouraged. Updated lab needed at/ before next visit. Not controlled when checked   Lipid Panel  Lab Results  Component Value Date   CHOL 194 11/30/2022   HDL 57 11/30/2022   LDLCALC 121 (H) 11/30/2022   TRIG 87 11/30/2022   CHOLHDL 3.4 11/30/2022

## 2023-05-17 NOTE — Assessment & Plan Note (Deleted)
 Home safety , use of walker and fall precautions discussed briefly, no falls or near falls

## 2023-05-17 NOTE — Patient Instructions (Addendum)
 Annual exam in 8 to 9 weeks, call if you need me sooner.  Depo-Medrol 80 mg IM in office today for back pain, this is followed by a 5-day course of prednisone.  Please get fasting labs no later than March 31, you will be contacted with results.( Nurse pls print and give labs ordered in November as well as those today, she needs both)  Please work on food choices cutting back on sweets and starchy foods you have gained 30 pounds over the past several months and your blood pressure is uncontrolled.  New higher dose of medication for blood pressure is olmesartan 40 mg 1 tablet once daily.  This is an increase you have been on olmesartan 20 mg 1 daily.  Diet rich in mainly vegetable fruit beings and white meat with water as the  main liquid as well as a commitment to 30 minutes/day of exercise and llimiting and lowering salt intake will all help to control your blood pressure and improve your health.  Thanks for choosing [facility], we consider it a privelige to serve you.

## 2023-05-17 NOTE — Assessment & Plan Note (Signed)
 Patient educated about the importance of limiting  Carbohydrate intake , the need to commit to daily physical activity for a minimum of 30 minutes , and to commit weight loss. The fact that changes in all these areas will reduce or eliminate all together the development of diabetes is stressed.      Latest Ref Rng & Units 11/30/2022    8:45 AM 10/15/2022   10:19 PM 09/27/2022    8:12 AM 09/21/2022   10:00 AM 08/20/2022   11:24 AM  Diabetic Labs  Chol 100 - 199 mg/dL 161     096   HDL >04 mg/dL 57     72   Calc LDL 0 - 99 mg/dL 540     76   Triglycerides 0 - 149 mg/dL 87     86   Creatinine 0.57 - 1.00 mg/dL 9.81  1.91  4.78  2.95  1.06       05/17/2023    9:41 AM 05/17/2023    9:34 AM 05/17/2023    9:27 AM 01/14/2023    9:35 AM 01/14/2023    8:54 AM 01/14/2023    8:52 AM 12/06/2022    2:59 PM  BP/Weight  Systolic BP 140 148 152 138 194 182 133  Diastolic BP 60 78 69 82 80 101 72  Wt. (Lbs)   152.12   133.08 122.6  BMI   26.95 kg/m2   23.57 kg/m2 22.42 kg/m2       No data to display          Updated lab needed at/ before next visit.

## 2023-05-17 NOTE — Assessment & Plan Note (Signed)
  Patient re-educated about  the importance of commitment to a  minimum of 150 minutes of exercise per week as able.  The importance of healthy food choices with portion control discussed, as well as eating regularly and within a 12 hour window most days. The need to choose "clean , green" food 50 to 75% of the time is discussed, as well as to make water the primary drink and set a goal of 64 ounces water daily.       05/17/2023    9:27 AM 01/14/2023    8:52 AM 12/06/2022    2:59 PM  Weight /BMI  Weight 152 lb 1.9 oz 133 lb 1.3 oz 122 lb 9.6 oz  Height 5\' 3"  (1.6 m) 5\' 3"  (1.6 m)   BMI 26.95 kg/m2 23.57 kg/m2 22.42 kg/m2    Worsened

## 2023-05-17 NOTE — Assessment & Plan Note (Addendum)
 Depomedrol 80 mg , and short course prednisone

## 2023-05-21 LAB — CMP14+EGFR
ALT: 14 IU/L (ref 0–32)
AST: 32 IU/L (ref 0–40)
Albumin: 4.4 g/dL (ref 3.9–4.9)
Alkaline Phosphatase: 129 IU/L — ABNORMAL HIGH (ref 44–121)
BUN/Creatinine Ratio: 16 (ref 12–28)
BUN: 14 mg/dL (ref 8–27)
Bilirubin Total: 0.2 mg/dL (ref 0.0–1.2)
CO2: 22 mmol/L (ref 20–29)
Calcium: 10.2 mg/dL (ref 8.7–10.3)
Chloride: 104 mmol/L (ref 96–106)
Creatinine, Ser: 0.88 mg/dL (ref 0.57–1.00)
Globulin, Total: 3.4 g/dL (ref 1.5–4.5)
Glucose: 89 mg/dL (ref 70–99)
Potassium: 4.3 mmol/L (ref 3.5–5.2)
Sodium: 140 mmol/L (ref 134–144)
Total Protein: 7.8 g/dL (ref 6.0–8.5)
eGFR: 73 mL/min/{1.73_m2} (ref >59)

## 2023-05-21 LAB — LIPID PANEL
Chol/HDL Ratio: 3.3 ratio (ref 0.0–4.4)
Cholesterol, Total: 246 mg/dL — ABNORMAL HIGH (ref 100–199)
HDL: 74 mg/dL (ref >39)
LDL Chol Calc (NIH): 156 mg/dL — ABNORMAL HIGH (ref 0–99)
Triglycerides: 94 mg/dL (ref 0–149)
VLDL Cholesterol Cal: 16 mg/dL (ref 5–40)

## 2023-05-21 LAB — CBC
Hematocrit: 41.1 % (ref 34.0–46.6)
Hemoglobin: 13.1 g/dL (ref 11.1–15.9)
MCH: 26.7 pg (ref 26.6–33.0)
MCHC: 31.9 g/dL (ref 31.5–35.7)
MCV: 84 fL (ref 79–97)
Platelets: 260 10*3/uL (ref 150–450)
RBC: 4.9 x10E6/uL (ref 3.77–5.28)
RDW: 13.6 % (ref 11.7–15.4)
WBC: 7 10*3/uL (ref 3.4–10.8)

## 2023-05-21 LAB — VITAMIN D 25 HYDROXY (VIT D DEFICIENCY, FRACTURES): Vit D, 25-Hydroxy: 30.5 ng/mL (ref 30.0–100.0)

## 2023-05-22 ENCOUNTER — Encounter: Payer: Self-pay | Admitting: Family Medicine

## 2023-05-22 MED ORDER — VITAMIN D (ERGOCALCIFEROL) 1.25 MG (50000 UNIT) PO CAPS: 50000.0000 [IU] | ORAL_CAPSULE | ORAL | 2 refills | Status: DC

## 2023-05-22 NOTE — Addendum Note (Signed)
 Addended by: Syliva Overman E on: 05/22/2023 11:17 AM   Modules accepted: Orders

## 2023-07-05 ENCOUNTER — Other Ambulatory Visit: Payer: Self-pay | Admitting: Family Medicine

## 2023-07-28 ENCOUNTER — Encounter: Payer: Self-pay | Admitting: Family Medicine

## 2023-07-28 ENCOUNTER — Ambulatory Visit (INDEPENDENT_AMBULATORY_CARE_PROVIDER_SITE_OTHER): Admitting: Family Medicine

## 2023-07-28 VITALS — BP 150/80 | HR 50 | Resp 16 | Ht 63.0 in | Wt 158.1 lb

## 2023-07-28 DIAGNOSIS — M79601 Pain in right arm: Secondary | ICD-10-CM

## 2023-07-28 DIAGNOSIS — E782 Mixed hyperlipidemia: Secondary | ICD-10-CM

## 2023-07-28 DIAGNOSIS — I10 Essential (primary) hypertension: Secondary | ICD-10-CM

## 2023-07-28 MED ORDER — CARVEDILOL 3.125 MG PO TABS: 3.1250 mg | ORAL_TABLET | Freq: Two times a day (BID) | ORAL | 3 refills | Status: DC

## 2023-07-28 MED ORDER — PREDNISONE 10 MG PO TABS: 10.0000 mg | ORAL_TABLET | Freq: Two times a day (BID) | ORAL | 0 refills | Status: DC

## 2023-07-28 NOTE — Assessment & Plan Note (Signed)
 Hyperlipidemia:Low fat diet discussed and encouraged.   Lipid Panel  Lab Results  Component Value Date   CHOL 246 (H) 05/20/2023   HDL 74 05/20/2023   LDLCALC 156 (H) 05/20/2023   TRIG 94 05/20/2023   CHOLHDL 3.3 05/20/2023     Updated lab needed at/ before next visit.

## 2023-07-28 NOTE — Patient Instructions (Addendum)
 F/U in 2 months, re eval blood pressure, call if you need me sooner  5 day course of prednisone  is prescribed for right arm pain and swelling, tendonitis, call or send message if persists after next week  Fasting lipid, cmp and EGFR 5 days before next visit  New additional med for blood pressure is carvedilol  one twice daily,  6am and 6 pm  Mammogram due 12/7 or after please schedule at checkout  It is important that you exercise regularly at least 30 minutes 5 times a week. If you develop chest pain, have severe difficulty breathing, or feel very tired, stop exercising immediately and seek medical attention   Think about what you will eat, plan ahead. Choose " clean, green, fresh or frozen" over canned, processed or packaged foods which are more sugary, salty and fatty. 70 to 75% of food eaten should be vegetables and fruit. Three meals at set times with snacks allowed between meals, but they must be fruit or vegetables. Aim to eat over a 12 hour period , example 7 am to 7 pm, and STOP after  your last meal of the day. Drink water ,generally about 64 ounces per day, no other drink is as healthy. Fruit juice is best enjoyed in a healthy way, by EATING the fruit.   Thanks for choosing [facility], we consider it a privelige to serve you.

## 2023-07-28 NOTE — Assessment & Plan Note (Addendum)
 DASH diet and commitment to daily physical activity for a minimum of 30 minutes discussed and encouraged, as a part of hypertension management. The importance of attaining a healthy weight is also discussed.     07/28/2023    9:00 AM 07/28/2023    8:22 AM 05/17/2023    9:41 AM 05/17/2023    9:34 AM 05/17/2023    9:27 AM 01/14/2023    9:35 AM 01/14/2023    8:54 AM  BP/Weight  Systolic BP 150 138 140 148 152 138 194  Diastolic BP 80 79 60 78 69 82 80  Wt. (Lbs)  158.08   152.12    BMI  28 kg/m2   26.95 kg/m2           Uncontrolled add twice daily coreg 

## 2023-07-31 ENCOUNTER — Encounter: Payer: Self-pay | Admitting: Family Medicine

## 2023-07-31 DIAGNOSIS — M79601 Pain in right arm: Secondary | ICD-10-CM | POA: Insufficient documentation

## 2023-07-31 NOTE — Assessment & Plan Note (Signed)
 Acute onset , 5 day course of low dose prednisone  prescribed if persists will need imaging

## 2023-07-31 NOTE — Progress Notes (Signed)
   Catherine Mclean     MRN: [id number]      DOB: September 15, 1960  Chief Complaint  Patient presents with   Hypertension    8 week follow up    Arm Pain    Complains of right arm pain in bicep region. Started Sunday. Pain increases with movement. No known injury     HPI Catherine Mclean is here for follow up and re-evaluation of chronic medical conditions, medication management and review of any available recent lab and radiology data.  Preventive health is updated, specifically  Cancer screening and Immunization.   Questions or concerns regarding consultations or procedures which the PT has had in the interim are  addressed. The PT denies any adverse reactions to current medications since the last visit.  4 day  h/o right arm pain, no inciting trauma worse with movementr  ROS Denies recent fever or chills. Denies sinus pressure, nasal congestion, ear pain or sore throat. Denies chest congestion, productive cough or wheezing. Denies chest pains, palpitations and leg swelling Denies abdominal pain, nausea, vomiting,diarrhea or constipation.   Denies dysuria, frequency, hesitancy or incontinence. . Denies headaches, seizures, numbness, or tingling. Denies depression, anxiety or insomnia. Denies skin break down or rash.   PE  BP (!) 150/80   Pulse (!) 50   Resp 16   Ht 5\' 3"  (1.6 m)   Wt 158 lb 1.3 oz (71.7 kg)   LMP 05/10/2016   SpO2 97%   BMI 28.00 kg/m   Patient alert and oriented and in no cardiopulmonary distress.  HEENT: No facial asymmetry, EOMI,     Neck supple .  Chest: Clear to auscultation bilaterally.  CVS: S1, S2 no murmurs, no S3.Regular rate.  ABD: Soft non tender.   Ext: No edema  MS: Adequate ROM spine, hips and knees.Right arm slightly swollen and tender to deep palpation with reduced ROM  Skin: Intact, no ulcerations or rash noted.  Psych: Good eye contact, normal affect. Memory intact not anxious or depressed appearing.  CNS: CN 2-12 intact, power,   normal throughout.no focal deficits noted.   Assessment & Plan  Hypertension DASH diet and commitment to daily physical activity for a minimum of 30 minutes discussed and encouraged, as a part of hypertension management. The importance of attaining a healthy weight is also discussed.     07/28/2023    9:00 AM 07/28/2023    8:22 AM 05/17/2023    9:41 AM 05/17/2023    9:34 AM 05/17/2023    9:27 AM 01/14/2023    9:35 AM 01/14/2023    8:54 AM  BP/Weight  Systolic BP 150 138 140 148 152 138 194  Diastolic BP 80 79 60 78 69 82 80  Wt. (Lbs)  158.08   152.12    BMI  28 kg/m2   26.95 kg/m2           Uncontrolled add twice daily coreg   Hyperlipemia Hyperlipidemia:Low fat diet discussed and encouraged.   Lipid Panel  Lab Results  Component Value Date   CHOL 246 (H) 05/20/2023   HDL 74 05/20/2023   LDLCALC 156 (H) 05/20/2023   TRIG 94 05/20/2023   CHOLHDL 3.3 05/20/2023     Updated lab needed at/ before next visit.   Arm pain, anterior, right Acute onset , 5 day course of low dose prednisone  prescribed if persists will need imaging

## 2023-08-15 ENCOUNTER — Other Ambulatory Visit (HOSPITAL_COMMUNITY): Payer: Self-pay | Admitting: Family Medicine

## 2023-08-15 DIAGNOSIS — Z1231 Encounter for screening mammogram for malignant neoplasm of breast: Secondary | ICD-10-CM

## 2023-09-13 ENCOUNTER — Other Ambulatory Visit: Payer: Self-pay | Admitting: Family Medicine

## 2023-09-28 ENCOUNTER — Encounter: Payer: Self-pay | Admitting: Family Medicine

## 2023-09-28 ENCOUNTER — Ambulatory Visit (INDEPENDENT_AMBULATORY_CARE_PROVIDER_SITE_OTHER): Admitting: Family Medicine

## 2023-09-28 VITALS — BP 160/88 | HR 54 | Resp 16 | Ht 63.0 in | Wt 164.0 lb

## 2023-09-28 DIAGNOSIS — M79601 Pain in right arm: Secondary | ICD-10-CM

## 2023-09-28 DIAGNOSIS — I10 Essential (primary) hypertension: Secondary | ICD-10-CM

## 2023-09-28 DIAGNOSIS — M542 Cervicalgia: Secondary | ICD-10-CM

## 2023-09-28 DIAGNOSIS — E663 Overweight: Secondary | ICD-10-CM

## 2023-09-28 DIAGNOSIS — Z780 Asymptomatic menopausal state: Secondary | ICD-10-CM

## 2023-09-28 DIAGNOSIS — R7302 Impaired glucose tolerance (oral): Secondary | ICD-10-CM

## 2023-09-28 DIAGNOSIS — M47892 Other spondylosis, cervical region: Secondary | ICD-10-CM

## 2023-09-28 MED ORDER — KETOROLAC TROMETHAMINE 60 MG/2ML IM SOLN
60.0000 mg | Freq: Once | INTRAMUSCULAR | Status: AC
  Administered 2023-09-28: 60 mg via INTRAMUSCULAR

## 2023-09-28 MED ORDER — METHYLPREDNISOLONE ACETATE 80 MG/ML IJ SUSP
80.0000 mg | Freq: Once | INTRAMUSCULAR | Status: AC
  Administered 2023-09-28: 80 mg via INTRAMUSCULAR

## 2023-09-28 NOTE — Assessment & Plan Note (Signed)
 Limited response to oral steroid refer Ortho

## 2023-09-28 NOTE — Assessment & Plan Note (Signed)
 Noted omn imaging study persistent right arm pain which is debilitating

## 2023-09-28 NOTE — Progress Notes (Signed)
 Catherine Mclean     MRN: [id number]      DOB: May 30, 1960  Chief Complaint  Patient presents with   Hypertension    2 month follow up    Shoulder Pain    Still having right shoulder/arm pain as last time, no better. Limited mobility     HPI Catherine Mclean is here for follow up aof uncontrolled hypertension, she is extremely upset at the visit due to tragic death of  VERY CLOSE friend 1 day ago, crying, anxious  C/o ongoing right shoulder pain needs orto eval   ROS Denies recent fever or chills. Denies sinus pressure, nasal congestion, ear pain or sore throat. Denies chest congestion, productive cough or wheezing. Denies chest pains, palpitations and leg swelling Denies abdominal pain, nausea, vomiting,diarrhea or constipation.   Denies dysuria, frequency, hesitancy or incontinence. . Denies skin break down or rash.   PE  BP (!) 160/88   Pulse (!) 54   Resp 16   Ht 5' 3 (1.6 m)   Wt 164 lb 0.6 oz (74.4 kg)   LMP 05/10/2016   SpO2 99%   BMI 29.06 kg/m   Patient alert and oriented and in no cardiopulmonary distress.  HEENT: No facial asymmetry, EOMI,     Neck decreased ROM with spasm.  Chest: Clear to auscultation bilaterally.  CVS: S1, S2 no murmurs, no S3.Regular rate.  ABD: Soft non tender.   Ext: No edema  MS: Adequate ROM spine, shoulders, hips and knees.  Skin: Intact, no ulcerations or rash noted.  Psych: Good eye contact, normal affect. Memory intact not anxious or depressed appearing.  CNS: CN 2-12 intact, power,  normal throughout.no focal deficits noted.   Assessment & Plan  Essential hypertension Uncontrolled, not taking medication as prescribed , and drinking a lot of sodas DASH diet and commitment to daily physical activity for a minimum of 30 minutes discussed and encouraged, as a part of hypertension management. The importance of attaining a healthy weight is also discussed.     09/28/2023    9:12 AM 09/28/2023    8:59 AM 09/28/2023    8:39 AM  07/28/2023    9:00 AM 07/28/2023    8:22 AM 05/17/2023    9:41 AM 05/17/2023    9:34 AM  BP/Weight  Systolic BP 160 168 185 150 138 140 148  Diastolic BP 88 92 83 80 79 60 78  Wt. (Lbs)   164.04  158.08    BMI   29.06 kg/m2  28 kg/m2         Overweight (BMI 25.0-29.9) Significant weight gain, check TSH Patient re-educated about  the importance of commitment to a  minimum of 150 minutes of exercise per week as able.  The importance of healthy food choices with portion control discussed, as well as eating regularly and within a 12 hour window most days. The need to choose clean , green food 50 to 75% of the time is discussed, as well as to make water  the primary drink and set a goal of 64 ounces water  daily.       09/28/2023    8:39 AM 07/28/2023    8:22 AM 05/17/2023    9:27 AM  Weight /BMI  Weight 164 lb 0.6 oz 158 lb 1.3 oz 152 lb 1.9 oz  Height 5' 3 (1.6 m) 5' 3 (1.6 m) 5' 3 (1.6 m)  BMI 29.06 kg/m2 28 kg/m2 26.95 kg/m2      DJD (degenerative joint  disease) of cervical spine Noted omn imaging study persistent right arm pain which is debilitating  Arm pain, anterior, right Limited response to oral steroid refer Ortho  Post-menopausal Dexa past due needs to be scheduled  Neck pain Increased pain radaiting down arm Uncontrolled.Toradol  and depo medrol  administered IM in the office , to be followed by a short course of oral prednisone  and refer Ortho.   IGT (impaired glucose tolerance) Deteriorated Patient educated about the importance of limiting  Carbohydrate intake , the need to commit to daily physical activity for a minimum of 30 minutes , and to commit weight loss. The fact that changes in all these areas will reduce or eliminate all together the development of diabetes is stressed.      Latest Ref Rng & Units 09/28/2023    9:44 AM 05/20/2023    8:06 AM 11/30/2022    8:45 AM 10/15/2022   10:19 PM 09/27/2022    8:12 AM  Diabetic Labs  HbA1c 4.8 - 5.6 % 5.7        Chol 100 - 199 mg/dL  753  805     HDL >60 mg/dL  74  57     Calc LDL 0 - 99 mg/dL  843  878     Triglycerides 0 - 149 mg/dL  94  87     Creatinine 0.57 - 1.00 mg/dL  9.11  9.21  9.25  8.85       10/19/2023    8:53 AM 09/28/2023    9:12 AM 09/28/2023    8:59 AM 09/28/2023    8:39 AM 07/28/2023    9:00 AM 07/28/2023    8:22 AM 05/17/2023    9:41 AM  BP/Weight  Systolic BP 138 160 168 185 150 138 140  Diastolic BP 75 88 92 83 80 79 60  Wt. (Lbs)    164.04  158.08   BMI    29.06 kg/m2  28 kg/m2        No data to display

## 2023-09-28 NOTE — Assessment & Plan Note (Signed)
 Uncontrolled, not taking medication as prescribed , and drinking a lot of sodas DASH diet and commitment to daily physical activity for a minimum of 30 minutes discussed and encouraged, as a part of hypertension management. The importance of attaining a healthy weight is also discussed.     09/28/2023    9:12 AM 09/28/2023    8:59 AM 09/28/2023    8:39 AM 07/28/2023    9:00 AM 07/28/2023    8:22 AM 05/17/2023    9:41 AM 05/17/2023    9:34 AM  BP/Weight  Systolic BP 160 168 185 150 138 140 148  Diastolic BP 88 92 83 80 79 60 78  Wt. (Lbs)   164.04  158.08    BMI   29.06 kg/m2  28 kg/m2

## 2023-09-28 NOTE — Assessment & Plan Note (Addendum)
 Significant weight gain, check TSH Patient re-educated about  the importance of commitment to a  minimum of 150 minutes of exercise per week as able.  The importance of healthy food choices with portion control discussed, as well as eating regularly and within a 12 hour window most days. The need to choose clean , green food 50 to 75% of the time is discussed, as well as to make water  the primary drink and set a goal of 64 ounces water  daily.       09/28/2023    8:39 AM 07/28/2023    8:22 AM 05/17/2023    9:27 AM  Weight /BMI  Weight 164 lb 0.6 oz 158 lb 1.3 oz 152 lb 1.9 oz  Height 5' 3 (1.6 m) 5' 3 (1.6 m) 5' 3 (1.6 m)  BMI 29.06 kg/m2 28 kg/m2 26.95 kg/m2

## 2023-09-28 NOTE — Patient Instructions (Addendum)
 Nurse BP check in 3 weeks MD follow up in 4 months  Pls schedule dexa for Novemebr appt at checkout   NEED to take carvedilol  TWO times daily, 6 am and 6 pm for blood pressure to be controlled  Depo medrol  80 mg IM and Toradol  6-0  mg iM in office today for arm pain and you are referred to Orthopedics, Dr Hiram per your request,  re chronic right arm pain, likely coming from your neck  You need to stop sodas , and drink only water , also snacks and food needs to be limited to vegetables, fruit, beans and healthy meats that you prepare  HBA1C and TSH today  It is important that you exercise regularly at least 30 minutes 5 times a week. If you develop chest pain, have severe difficulty breathing, or feel very tired, stop exercising immediately and seek medical attention   Thanks for choosing [facility], we consider it a privelige to serve you.

## 2023-09-29 ENCOUNTER — Ambulatory Visit: Payer: Self-pay | Admitting: Family Medicine

## 2023-09-29 LAB — HEMOGLOBIN A1C
Est. average glucose Bld gHb Est-mCnc: 117 mg/dL
Hgb A1c MFr Bld: 5.7 % — ABNORMAL HIGH (ref 4.8–5.6)

## 2023-09-29 LAB — TSH+FREE T4
Free T4: 1.07 ng/dL (ref 0.82–1.77)
TSH: 1.26 u[IU]/mL (ref 0.450–4.500)

## 2023-10-05 ENCOUNTER — Telehealth: Payer: Self-pay | Admitting: Family Medicine

## 2023-10-05 NOTE — Telephone Encounter (Signed)
 Copied from CRM [id number]. Topic: Referral - Question >> Oct 05, 2023 10:13 AM [place] B wrote: Reason for CRM: patient called in checking status of referral for orthopaedic, still shows pending since 08/06. Please cb with any further updates

## 2023-10-08 ENCOUNTER — Other Ambulatory Visit: Payer: Self-pay | Admitting: Family Medicine

## 2023-10-10 NOTE — Telephone Encounter (Signed)
 Referral sent to: [facility] [address], [address] - [place] [address] [phone]  MyChart Message sent to Patient with Specialty Office contact information.

## 2023-10-19 ENCOUNTER — Ambulatory Visit (INDEPENDENT_AMBULATORY_CARE_PROVIDER_SITE_OTHER)

## 2023-10-19 DIAGNOSIS — I10 Essential (primary) hypertension: Secondary | ICD-10-CM | POA: Diagnosis not present

## 2023-10-19 NOTE — Progress Notes (Signed)
 Patient is in office today for a nurse visit for Blood Pressure Check. Patiens Blood Pressure was 138/75

## 2023-10-23 DIAGNOSIS — Z780 Asymptomatic menopausal state: Secondary | ICD-10-CM | POA: Insufficient documentation

## 2023-10-23 NOTE — Assessment & Plan Note (Signed)
 Dexa past due needs to be scheduled

## 2023-10-23 NOTE — Assessment & Plan Note (Signed)
 Deteriorated Patient educated about the importance of limiting  Carbohydrate intake , the need to commit to daily physical activity for a minimum of 30 minutes , and to commit weight loss. The fact that changes in all these areas will reduce or eliminate all together the development of diabetes is stressed.      Latest Ref Rng & Units 09/28/2023    9:44 AM 05/20/2023    8:06 AM 11/30/2022    8:45 AM 10/15/2022   10:19 PM 09/27/2022    8:12 AM  Diabetic Labs  HbA1c 4.8 - 5.6 % 5.7       Chol 100 - 199 mg/dL  753  805     HDL >60 mg/dL  74  57     Calc LDL 0 - 99 mg/dL  843  878     Triglycerides 0 - 149 mg/dL  94  87     Creatinine 0.57 - 1.00 mg/dL  9.11  9.21  9.25  8.85       10/19/2023    8:53 AM 09/28/2023    9:12 AM 09/28/2023    8:59 AM 09/28/2023    8:39 AM 07/28/2023    9:00 AM 07/28/2023    8:22 AM 05/17/2023    9:41 AM  BP/Weight  Systolic BP 138 160 168 185 150 138 140  Diastolic BP 75 88 92 83 80 79 60  Wt. (Lbs)    164.04  158.08   BMI    29.06 kg/m2  28 kg/m2        No data to display

## 2023-10-23 NOTE — Assessment & Plan Note (Signed)
 Increased pain radaiting down arm Uncontrolled.Toradol  and depo medrol  administered IM in the office , to be followed by a short course of oral prednisone  and refer Ortho.

## 2023-11-15 ENCOUNTER — Ambulatory Visit (INDEPENDENT_AMBULATORY_CARE_PROVIDER_SITE_OTHER)

## 2023-11-15 DIAGNOSIS — Z23 Encounter for immunization: Secondary | ICD-10-CM

## 2023-12-28 ENCOUNTER — Telehealth: Payer: Self-pay

## 2023-12-28 ENCOUNTER — Other Ambulatory Visit: Payer: Self-pay

## 2023-12-28 MED ORDER — CARVEDILOL 3.125 MG PO TABS: 3.1250 mg | ORAL_TABLET | Freq: Two times a day (BID) | ORAL | 1 refills | Status: DC

## 2023-12-28 MED ORDER — TIZANIDINE HCL 4 MG PO TABS: 4.0000 mg | ORAL_TABLET | Freq: Two times a day (BID) | ORAL | 1 refills | Status: AC

## 2023-12-28 MED ORDER — OLMESARTAN MEDOXOMIL 40 MG PO TABS: 40.0000 mg | ORAL_TABLET | Freq: Every day | ORAL | 1 refills | Status: AC

## 2023-12-28 NOTE — Telephone Encounter (Signed)
 We will wait for pharmacy to contact us  since pt does not have information

## 2023-12-28 NOTE — Telephone Encounter (Signed)
 Copied from CRM [id number]. Topic: General - Other >> Dec 28, 2023 11:19 AM Shanda MATSU wrote: Reason for CRM: Patient called in to adv that a new pharmacy is going to reach out to us  to let us  know they are going to be her preferred pharmacy filling her prescriptions and sending them to her thru mail, patient did not know the name of the pharmacy, stated that is a pharmacy thru her ins, [employer].

## 2023-12-31 ENCOUNTER — Other Ambulatory Visit: Payer: Self-pay | Admitting: Family Medicine

## 2024-01-13 ENCOUNTER — Other Ambulatory Visit: Payer: Self-pay | Admitting: Family Medicine

## 2024-01-13 ENCOUNTER — Telehealth: Payer: Self-pay

## 2024-01-13 MED ORDER — VITAMIN D (ERGOCALCIFEROL) 1.25 MG (50000 UNIT) PO CAPS: 50000.0000 [IU] | ORAL_CAPSULE | ORAL | 0 refills | Status: AC

## 2024-01-13 NOTE — Progress Notes (Signed)
 Refill sent.

## 2024-01-13 NOTE — Telephone Encounter (Signed)
 Received fax from [facility] on behalf of pt, pt needs refill of Vit D. Last levels were 04/2023

## 2024-01-30 ENCOUNTER — Inpatient Hospital Stay (HOSPITAL_COMMUNITY): Admission: RE | Admit: 2024-01-30 | Discharge: 2024-01-30 | Attending: Family Medicine | Admitting: Family Medicine

## 2024-01-30 ENCOUNTER — Other Ambulatory Visit (HOSPITAL_COMMUNITY)

## 2024-01-30 ENCOUNTER — Encounter (HOSPITAL_COMMUNITY): Payer: Self-pay

## 2024-01-30 DIAGNOSIS — Z1231 Encounter for screening mammogram for malignant neoplasm of breast: Secondary | ICD-10-CM

## 2024-02-07 ENCOUNTER — Encounter: Payer: Self-pay | Admitting: Family Medicine

## 2024-02-07 ENCOUNTER — Ambulatory Visit: Payer: Self-pay | Admitting: Family Medicine

## 2024-02-07 VITALS — BP 142/78 | HR 60 | Resp 16 | Ht 63.0 in | Wt 163.0 lb

## 2024-02-07 DIAGNOSIS — R7302 Impaired glucose tolerance (oral): Secondary | ICD-10-CM

## 2024-02-07 DIAGNOSIS — M25551 Pain in right hip: Secondary | ICD-10-CM | POA: Insufficient documentation

## 2024-02-07 DIAGNOSIS — G8929 Other chronic pain: Secondary | ICD-10-CM

## 2024-02-07 DIAGNOSIS — M25511 Pain in right shoulder: Secondary | ICD-10-CM | POA: Insufficient documentation

## 2024-02-07 DIAGNOSIS — I10 Essential (primary) hypertension: Secondary | ICD-10-CM

## 2024-02-07 DIAGNOSIS — E782 Mixed hyperlipidemia: Secondary | ICD-10-CM

## 2024-02-07 DIAGNOSIS — E663 Overweight: Secondary | ICD-10-CM

## 2024-02-07 DIAGNOSIS — E559 Vitamin D deficiency, unspecified: Secondary | ICD-10-CM

## 2024-02-07 MED ORDER — METHYLPREDNISOLONE ACETATE 80 MG/ML IJ SUSP
80.0000 mg | Freq: Once | INTRAMUSCULAR | Status: AC
  Administered 2024-02-07: 09:00:00 80 mg via INTRAMUSCULAR

## 2024-02-07 MED ORDER — CARVEDILOL 6.25 MG PO TABS: 6.2500 mg | ORAL_TABLET | Freq: Two times a day (BID) | ORAL | 3 refills | Status: DC

## 2024-02-07 NOTE — Assessment & Plan Note (Signed)
 Uncontrolled. depo medrol  administered IM in the office , due to uncontrolled pain

## 2024-02-07 NOTE — Assessment & Plan Note (Signed)
 Patient educated about the importance of limiting  Carbohydrate intake , the need to commit to daily physical activity for a minimum of 30 minutes , and to commit weight loss. The fact that changes in all these areas will reduce or eliminate all together the development of diabetes is stressed.      Latest Ref Rng & Units 09/28/2023    9:44 AM 05/20/2023    8:06 AM 11/30/2022    8:45 AM 10/15/2022   10:19 PM 09/27/2022    8:12 AM  Diabetic Labs  HbA1c 4.8 - 5.6 % 5.7       Chol 100 - 199 mg/dL  753  805     HDL >60 mg/dL  74  57     Calc LDL 0 - 99 mg/dL  843  878     Triglycerides 0 - 149 mg/dL  94  87     Creatinine 0.57 - 1.00 mg/dL  9.11  9.21  9.25  8.85       02/07/2024    9:04 AM 02/07/2024    8:38 AM 10/19/2023    8:53 AM 09/28/2023    9:12 AM 09/28/2023    8:59 AM 09/28/2023    8:39 AM 07/28/2023    9:00 AM  BP/Weight  Systolic BP 142 165 138 160 168 185 150  Diastolic BP 78 70 75 88 92 83 80  Wt. (Lbs)  163    164.04   BMI  28.87 kg/m2    29.06 kg/m2        No data to display          Updated lab needed at/ before next visit.

## 2024-02-07 NOTE — Assessment & Plan Note (Signed)
 Updated lab needed at/ before next visit.

## 2024-02-07 NOTE — Progress Notes (Signed)
 Catherine Mclean     MRN: [id number]      DOB: 1960-09-18  Chief Complaint  Patient presents with   Hypertension    4 month follow up    Hip Pain    Pt complains of ongoing hip pain, has been severe last 2 weeks. Requesting steroid injection     HPI Catherine Mclean is here for follow up and re-evaluation of chronic medical conditions, medication management and review of any available recent lab and radiology data.  Preventive health is updated, specifically  Cancer screening and Immunization.   Questions or concerns regarding consultations or procedures which the PT has had in the interim are  addressed. The PT denies any adverse reactions to current medications since the last visit.  C/O increased pain in both hips unprovoked, persistent, requests steroid injection for this  ROS Denies recent fever or chills. Denies sinus pressure, nasal congestion, ear pain or sore throat. Denies chest congestion, productive cough or wheezing. Denies chest pains, palpitations and leg swelling Denies abdominal pain, nausea, vomiting,diarrhea or constipation.   Denies dysuria, frequency, hesitancy or incontinence. Denies headaches, seizures, numbness, or tingling. Denies depression, anxiety or insomnia. Denies skin break down or rash.   PE  BP (!) 142/78   Pulse 60   Resp 16   Ht 5' 3 (1.6 m)   Wt 163 lb (73.9 kg)   LMP 05/10/2016   SpO2 97%   BMI 28.87 kg/m   Patient alert and oriented and in no cardiopulmonary distress.  HEENT: No facial asymmetry, EOMI,     Neck supple .  Chest: Clear to auscultation bilaterally.  CVS: S1, S2 no murmurs, no S3.Regular rate.  ABD: Soft non tender.   Ext: No edema  MS: decreased  ROM spine and  hips and right shoulder  Skin: Intact, no ulcerations or rash noted.  Psych: Good eye contact, normal affect. Memory intact not anxious or depressed appearing.  CNS: CN 2-12 intact, power,  normal throughout.no focal deficits noted.   Assessment &  Plan  Essential hypertension Uncontrolled increase coreg  dose , re eval in 1 month DASH diet and commitment to daily physical activity for a minimum of 30 minutes discussed and encouraged, as a part of hypertension management. The importance of attaining a healthy weight is also discussed.     02/07/2024    9:04 AM 02/07/2024    8:38 AM 10/19/2023    8:53 AM 09/28/2023    9:12 AM 09/28/2023    8:59 AM 09/28/2023    8:39 AM 07/28/2023    9:00 AM  BP/Weight  Systolic BP 142 165 138 160 168 185 150  Diastolic BP 78 70 75 88 92 83 80  Wt. (Lbs)  163    164.04   BMI  28.87 kg/m2    29.06 kg/m2        Bilateral primary osteoarthritis of knee Uncontrolled. depo medrol  administered IM in the office , due to uncontrolled pain  Hyperlipemia Hyperlipidemia:Low fat diet discussed and encouraged.   Lipid Panel  Lab Results  Component Value Date   CHOL 246 (H) 05/20/2023   HDL 74 05/20/2023   LDLCALC 156 (H) 05/20/2023   TRIG 94 05/20/2023   CHOLHDL 3.3 05/20/2023     Updated lab needed at/ before next visit.   IGT (impaired glucose tolerance) Patient educated about the importance of limiting  Carbohydrate intake , the need to commit to daily physical activity for a minimum of 30 minutes , and  to commit weight loss. The fact that changes in all these areas will reduce or eliminate all together the development of diabetes is stressed.      Latest Ref Rng & Units 09/28/2023    9:44 AM 05/20/2023    8:06 AM 11/30/2022    8:45 AM 10/15/2022   10:19 PM 09/27/2022    8:12 AM  Diabetic Labs  HbA1c 4.8 - 5.6 % 5.7       Chol 100 - 199 mg/dL  753  805     HDL >60 mg/dL  74  57     Calc LDL 0 - 99 mg/dL  843  878     Triglycerides 0 - 149 mg/dL  94  87     Creatinine 0.57 - 1.00 mg/dL  9.11  9.21  9.25  8.85       02/07/2024    9:04 AM 02/07/2024    8:38 AM 10/19/2023    8:53 AM 09/28/2023    9:12 AM 09/28/2023    8:59 AM 09/28/2023    8:39 AM 07/28/2023    9:00 AM  BP/Weight  Systolic BP  142 834 138 160 168 185 150  Diastolic BP 78 70 75 88 92 83 80  Wt. (Lbs)  163    164.04   BMI  28.87 kg/m2    29.06 kg/m2        No data to display          Updated lab needed at/ before next visit.   Vitamin D  deficiency Updated lab needed at/ before next visit.   Overweight (BMI 25.0-29.9)  Patient re-educated about  the importance of commitment to a  minimum of 150 minutes of exercise per week as able.  The importance of healthy food choices with portion control discussed, as well as eating regularly and within a 12 hour window most days. The need to choose clean , green food 50 to 75% of the time is discussed, as well as to make water  the primary drink and set a goal of 64 ounces water  daily.       02/07/2024    8:38 AM 09/28/2023    8:39 AM 07/28/2023    8:22 AM  Weight /BMI  Weight 163 lb 164 lb 0.6 oz 158 lb 1.3 oz  Height 5' 3 (1.6 m) 5' 3 (1.6 m) 5' 3 (1.6 m)  BMI 28.87 kg/m2 29.06 kg/m2 28 kg/m2      Shoulder pain, right Persistent pain and reduced mobility despite intra articular injection and home PT, will return to Ortho

## 2024-02-07 NOTE — Assessment & Plan Note (Signed)
 Persistent pain and reduced mobility despite intra articular injection and home PT, will return to Ortho

## 2024-02-07 NOTE — Assessment & Plan Note (Signed)
°  Patient re-educated about  the importance of commitment to a  minimum of 150 minutes of exercise per week as able.  The importance of healthy food choices with portion control discussed, as well as eating regularly and within a 12 hour window most days. The need to choose clean , green food 50 to 75% of the time is discussed, as well as to make water  the primary drink and set a goal of 64 ounces water  daily.       02/07/2024    8:38 AM 09/28/2023    8:39 AM 07/28/2023    8:22 AM  Weight /BMI  Weight 163 lb 164 lb 0.6 oz 158 lb 1.3 oz  Height 5' 3 (1.6 m) 5' 3 (1.6 m) 5' 3 (1.6 m)  BMI 28.87 kg/m2 29.06 kg/m2 28 kg/m2

## 2024-02-07 NOTE — Assessment & Plan Note (Signed)
 Hyperlipidemia:Low fat diet discussed and encouraged.   Lipid Panel  Lab Results  Component Value Date   CHOL 246 (H) 05/20/2023   HDL 74 05/20/2023   LDLCALC 156 (H) 05/20/2023   TRIG 94 05/20/2023   CHOLHDL 3.3 05/20/2023     Updated lab needed at/ before next visit.

## 2024-02-07 NOTE — Assessment & Plan Note (Signed)
 Uncontrolled increase coreg  dose , re eval in 1 month DASH diet and commitment to daily physical activity for a minimum of 30 minutes discussed and encouraged, as a part of hypertension management. The importance of attaining a healthy weight is also discussed.     02/07/2024    9:04 AM 02/07/2024    8:38 AM 10/19/2023    8:53 AM 09/28/2023    9:12 AM 09/28/2023    8:59 AM 09/28/2023    8:39 AM 07/28/2023    9:00 AM  BP/Weight  Systolic BP 142 165 138 160 168 185 150  Diastolic BP 78 70 75 88 92 83 80  Wt. (Lbs)  163    164.04   BMI  28.87 kg/m2    29.06 kg/m2

## 2024-02-07 NOTE — Patient Instructions (Addendum)
 F/U, Annual, mid to end January , pneummonia vaccine at visit  Please consider covid vaccine in 2 t o 3 weeks   Please reschedule bone density at checkout  New higher dose carvedilol  6.25 mg one twice daily, but oK to take TWO 3.125 mg tabs two times dAILY UNTIL YOU GET THE NEW HIGHER TABLET  Please GET IN TOUCH IF YOU HAVE PROBLEMS WITH THE HIGHER DOSE of BP medication  DEPOMEDROL 80 MG Im TODAY  Start calcium  , 2 Tums  every day and continue weekly vit D and walk for 30 minutes each day  Fasting cbc, lipid, cmp and eGFr, hBA1C and TSH and vit D 1 week before bnext appt  Thanks for choosing [facility], we consider it a privelige to serve you.

## 2024-02-13 ENCOUNTER — Ambulatory Visit (HOSPITAL_COMMUNITY)
Admission: RE | Admit: 2024-02-13 | Discharge: 2024-02-13 | Disposition: A | Discharge status: Home / Self Care | Source: Ambulatory Visit | Attending: Family Medicine | Admitting: Family Medicine

## 2024-02-13 DIAGNOSIS — Z780 Asymptomatic menopausal state: Secondary | ICD-10-CM | POA: Diagnosis present

## 2024-02-21 MED ORDER — OYSTER SHELL CALCIUM/D3 500-5 MG-MCG PO TABS: 1.0000 | ORAL_TABLET | Freq: Two times a day (BID) | ORAL | 3 refills | Status: AC

## 2024-02-21 MED ORDER — ALENDRONATE SODIUM 70 MG PO TABS: 70.0000 mg | ORAL_TABLET | ORAL | 3 refills | Status: AC

## 2024-02-21 NOTE — Addendum Note (Signed)
 Addended by: ANTONETTA QUANT E on: 02/21/2024 12:03 PM   Modules accepted: Orders

## 2024-02-28 ENCOUNTER — Other Ambulatory Visit: Payer: Self-pay

## 2024-02-28 ENCOUNTER — Telehealth: Payer: Self-pay | Admitting: Family Medicine

## 2024-02-28 DIAGNOSIS — I10 Essential (primary) hypertension: Secondary | ICD-10-CM

## 2024-02-28 MED ORDER — CARVEDILOL 12.5 MG PO TABS: 12.5000 mg | ORAL_TABLET | Freq: Two times a day (BID) | ORAL | 3 refills | Status: AC

## 2024-02-28 MED ORDER — CARVEDILOL 12.5 MG PO TABS: 12.5000 mg | ORAL_TABLET | Freq: Two times a day (BID) | ORAL | 3 refills | Status: DC

## 2024-02-28 NOTE — Telephone Encounter (Signed)
 Resent

## 2024-02-28 NOTE — Progress Notes (Signed)
 Elevated BP noted, dose coreg  increased to 12.5 mg twice daily and sent to pharmacy, will message Nurse to contact pt re new higher dose, stop the 6.25 mg dose and collect new dose a t local pharmacy Keep Feb follow up

## 2024-02-28 NOTE — Progress Notes (Signed)
 Patient is in office today for a nurse visit for Blood Pressure Check. Patient blood pressure was 160/83, 166/84, 156/84, Patient No chest pain, No shortness of breath, No dyspnea on exertion, No orthopnea, No paroxysmal nocturnal dyspnea, No edema, No palpitations, No syncope

## 2024-02-28 NOTE — Telephone Encounter (Signed)
 Copied from CRM [id number]. Topic: Clinical - Prescription Issue >> Feb 28, 2024  1:07 PM China J wrote: Reason for CRM: The patient is needing her carvedilol  (COREG ) 12.5 MG tablet [facility], not [facility].

## 2024-02-28 NOTE — Telephone Encounter (Signed)
 Pls call pt, BP still high today, dose increase sent to local pharmacy and she needs to keep Feb 4 appt. Pals see my documentation in nurse visit today ?? Pls ask

## 2024-02-28 NOTE — Telephone Encounter (Signed)
"  Lvm informing.  "

## 2024-03-28 ENCOUNTER — Ambulatory Visit: Payer: Self-pay | Admitting: Family Medicine

## 2024-05-25 ENCOUNTER — Ambulatory Visit: Admitting: Family Medicine
